# Patient Record
Sex: Female | Born: 1990 | Race: Black or African American | Hispanic: No | Marital: Single | Smoking: Current every day smoker
Health system: Southern US, Community
[De-identification: ages and names within clinical notes are randomized; demographics above are authoritative.]

## PROBLEM LIST (undated history)

## (undated) ENCOUNTER — Inpatient Hospital Stay (HOSPITAL_COMMUNITY): Payer: Self-pay

## (undated) DIAGNOSIS — F419 Anxiety disorder, unspecified: Secondary | ICD-10-CM

## (undated) DIAGNOSIS — Z789 Other specified health status: Secondary | ICD-10-CM

## (undated) HISTORY — PX: NO PAST SURGERIES: SHX2092

## (undated) HISTORY — DX: Other specified health status: Z78.9

---
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
## 2012-01-19 ENCOUNTER — Encounter (HOSPITAL_COMMUNITY): Payer: Self-pay | Admitting: Emergency Medicine

## 2012-01-19 ENCOUNTER — Emergency Department (HOSPITAL_COMMUNITY)
Admission: EM | Admit: 2012-01-19 | Discharge: 2012-01-19 | Disposition: A | Discharge status: Home / Self Care | Payer: BC Managed Care – PPO | Attending: Emergency Medicine | Admitting: Emergency Medicine

## 2012-01-19 DIAGNOSIS — R05 Cough: Secondary | ICD-10-CM | POA: Insufficient documentation

## 2012-01-19 DIAGNOSIS — J039 Acute tonsillitis, unspecified: Secondary | ICD-10-CM

## 2012-01-19 DIAGNOSIS — R599 Enlarged lymph nodes, unspecified: Secondary | ICD-10-CM | POA: Insufficient documentation

## 2012-01-19 DIAGNOSIS — J029 Acute pharyngitis, unspecified: Secondary | ICD-10-CM | POA: Insufficient documentation

## 2012-01-19 DIAGNOSIS — R059 Cough, unspecified: Secondary | ICD-10-CM | POA: Insufficient documentation

## 2012-01-19 MED ORDER — AZITHROMYCIN 250 MG PO TABS: 250.0000 mg | ORAL_TABLET | Freq: Every day | ORAL | Status: AC

## 2012-01-19 MED ORDER — AZITHROMYCIN 250 MG PO TABS
500.0000 mg | ORAL_TABLET | Freq: Once | ORAL | Status: AC
  Administered 2012-01-19: 500 mg via ORAL
  Filled 2012-01-19: qty 2

## 2012-01-19 NOTE — Discharge Instructions (Signed)
Plenty of fluids.   Follow up if not improving °

## 2012-01-19 NOTE — ED Provider Notes (Signed)
History   This chart was scribed for Benny Lennert, MD by Charolett Bumpers . The patient was seen in room APFT20/APFT20.    CSN: [id number]  Arrival date & time 01/19/12  1751   First MD Initiated Contact with Patient 01/19/12 1803      No chief complaint on file.   (Consider location/radiation/quality/duration/timing/severity/associated sxs/prior treatment) HPI Comments: Robyn Willis is a 21 y.o. female who presents to the Emergency Department complaining of constant, moderate sore throat since this morning. Patient also reports an associated productive cough with white phlegm. Patient reports no pertinent medical or surgical hx.     Patient is a 21 y.o. female presenting with pharyngitis. The history is provided by the patient.  Sore Throat This is a new problem. The current episode started 6 to 12 hours ago. The problem occurs constantly. The problem has been gradually worsening. She has tried nothing for the symptoms.    History reviewed. No pertinent past medical history.  History reviewed. No pertinent past surgical history.  No family history on file.  History  Substance Use Topics  . Smoking status: Never Smoker   . Smokeless tobacco: Not on file  . Alcohol Use: No    OB History    Grav Para Term Preterm Abortions TAB SAB Ect Mult Living                  Review of Systems  Constitutional: Negative for fever.  HENT: Positive for sore throat.   Respiratory: Positive for cough.   All other systems reviewed and are negative.    Allergies  Penicillins  Home Medications  No current outpatient prescriptions on file.  BP 144/89  Pulse 120  Temp 99.7 F (37.6 C) (Oral)  Resp 17  Ht 5\' 3"  (1.6 m)  Wt 116 lb (52.617 kg)  BMI 20.55 kg/m2  SpO2 99%  LMP 01/08/2012  Physical Exam  Constitutional: She is oriented to person, place, and time. She appears well-developed.  HENT:  Head: Normocephalic and atraumatic.  Mouth/Throat: Oropharyngeal  exudate present.       Tonsils are enlarged bilaterally.   Eyes: Conjunctivae and EOM are normal. No scleral icterus.  Neck: Neck supple. No thyromegaly present.  Cardiovascular: Normal rate and regular rhythm.  Exam reveals no gallop and no friction rub.   No murmur heard. Pulmonary/Chest: No stridor. She has no wheezes. She has no rales. She exhibits no tenderness.  Abdominal: She exhibits no distension. There is no tenderness. There is no rebound.  Musculoskeletal: Normal range of motion. She exhibits no edema.  Lymphadenopathy:    She has cervical adenopathy.  Neurological: She is oriented to person, place, and time. Coordination normal.  Skin: No rash noted. No erythema.  Psychiatric: She has a normal mood and affect. Her behavior is normal.    ED Course  Procedures (including critical care time)  DIAGNOSTIC STUDIES: Oxygen Saturation is 99% on room air, normal by my interpretation.    COORDINATION OF CARE:  1807: Discussed planned course of treatment with the patient, who is agreeable at this time. Will order a rapid strep screen.     Labs Reviewed - No data to display No results found.   No diagnosis found.    MDM   The chart was scribed for me under my direct supervision.  I personally performed the history, physical, and medical decision making and all procedures in the evaluation of this patient.Benny Lennert, MD  01/19/12 1850 

## 2012-04-06 ENCOUNTER — Encounter (HOSPITAL_COMMUNITY): Payer: Self-pay | Admitting: *Deleted

## 2012-04-06 ENCOUNTER — Emergency Department (HOSPITAL_COMMUNITY)
Admission: EM | Admit: 2012-04-06 | Discharge: 2012-04-06 | Disposition: A | Discharge status: Home / Self Care | Payer: BC Managed Care – PPO | Attending: Emergency Medicine | Admitting: Emergency Medicine

## 2012-04-06 DIAGNOSIS — N390 Urinary tract infection, site not specified: Secondary | ICD-10-CM | POA: Insufficient documentation

## 2012-04-06 LAB — URINALYSIS, ROUTINE W REFLEX MICROSCOPIC
Bilirubin Urine: NEGATIVE
Glucose, UA: NEGATIVE mg/dL
Specific Gravity, Urine: 1.02 (ref 1.005–1.030)
pH: 7.5 (ref 5.0–8.0)

## 2012-04-06 LAB — URINE MICROSCOPIC-ADD ON

## 2012-04-06 MED ORDER — PHENAZOPYRIDINE HCL 200 MG PO TABS: 200.0000 mg | ORAL_TABLET | Freq: Three times a day (TID) | ORAL | Status: DC

## 2012-04-06 MED ORDER — SULFAMETHOXAZOLE-TRIMETHOPRIM 800-160 MG PO TABS: 1.0000 | ORAL_TABLET | Freq: Two times a day (BID) | ORAL | Status: DC

## 2012-04-06 NOTE — ED Notes (Signed)
Last night started pressure and dysuria, No fever or chills, NO NV,  Now pain lt flank

## 2012-04-06 NOTE — ED Provider Notes (Signed)
History    This chart was scribed for [employer]. Rubin Payor, MD, MD by Smitty Pluck. The patient was seen in room APA05 and the patient's care was started at 10:15PM.  CSN: [id number]  Arrival date & time 04/06/12  1916   First MD Initiated Contact with Patient 04/06/12 2207      Chief Complaint  Patient presents with  . Dysuria    (Consider location/radiation/quality/duration/timing/severity/associated sxs/prior treatment) Patient is a 21 y.o. female presenting with dysuria. The history is provided by the patient. No language interpreter was used.  Dysuria  Associated symptoms include flank pain. Pertinent negatives include no chills, no nausea and no vomiting.   Robyn Willis is a 21 y.o. female who presents to the Emergency Department complaining of constant, moderate left flank pain onset today. Pt reports having dysuria onset 1 day ago. Pt denies fevers, chills, n/v/d. Denies any other pain. Denies radiation of pain.   History reviewed. No pertinent past medical history.  History reviewed. No pertinent past surgical history.  History reviewed. No pertinent family history.  History  Substance Use Topics  . Smoking status: Never Smoker   . Smokeless tobacco: Not on file  . Alcohol Use: No    OB History    Grav Para Term Preterm Abortions TAB SAB Ect Mult Living                  Review of Systems  Constitutional: Negative for fever and chills.  Respiratory: Negative for shortness of breath.   Gastrointestinal: Negative for nausea, vomiting and diarrhea.  Genitourinary: Positive for dysuria and flank pain.  Neurological: Negative for weakness.    Allergies  Penicillins  Home Medications   Current Outpatient Rx  Name Route Sig Dispense Refill  . NORGESTIM-ETH ESTRAD TRIPHASIC 0.18/0.215/0.25 MG-25 MCG PO TABS Oral Take 1 tablet by mouth daily.    Marland Kitchen PHENAZOPYRIDINE HCL 200 MG PO TABS Oral Take 1 tablet (200 mg total) by mouth 3 (three) times daily. 6 tablet 0    . SULFAMETHOXAZOLE-TRIMETHOPRIM 800-160 MG PO TABS Oral Take 1 tablet by mouth 2 (two) times daily. 6 tablet 0    BP 121/75  Pulse 93  Temp 98.2 F (36.8 C) (Oral)  Resp 20  Ht 5\' 3"  (1.6 m)  Wt 113 lb (51.256 kg)  BMI 20.02 kg/m2  SpO2 100%  LMP 04/02/2012  Physical Exam  Nursing note and vitals reviewed. Constitutional: She is oriented to person, place, and time. She appears well-developed and well-nourished.  HENT:  Head: Normocephalic and atraumatic.  Neck: Normal range of motion. Neck supple.  Cardiovascular: Normal rate, regular rhythm and normal heart sounds.   Pulmonary/Chest: Effort normal and breath sounds normal. No respiratory distress. She has no wheezes.  Abdominal: She exhibits no distension. There is tenderness in the suprapubic area. There is no rebound, no guarding and no CVA tenderness.  Neurological: She is alert and oriented to person, place, and time.  Skin: Skin is warm and dry.  Psychiatric: She has a normal mood and affect. Her behavior is normal.    ED Course  Procedures (including critical care time) DIAGNOSTIC STUDIES: Oxygen Saturation is 100% on room air, normal by my interpretation.    COORDINATION OF CARE: 10:18 PM Discussed pt ED treatment with pt     Labs Reviewed  URINALYSIS, ROUTINE W REFLEX MICROSCOPIC - Abnormal; Notable for the following:    APPearance HAZY (*)     Hgb urine dipstick LARGE (*)  Protein, ur 30 (*)     Leukocytes, UA TRACE (*)     All other components within normal limits  URINE MICROSCOPIC-ADD ON - Abnormal; Notable for the following:    Squamous Epithelial / LPF MANY (*)     Bacteria, UA MANY (*)     Casts HYALINE CASTS (*)     All other components within normal limits  PREGNANCY, URINE  URINE CULTURE   No results found.   1. UTI (lower urinary tract infection)       MDM  Patient with left lower abdominal pain. Mild tenderness. She does not appear septic and does not have CVA tenderness. She has  an apparent urinary tract infection on lab work. Urine culture was sent to to high level resistance. She be discharged home with Bactrim and Pyridium.  I personally performed the services described in this documentation, which was scribed in my presence. The recorded information has been reviewed and considered.        Juliet Rude. Rubin Payor, MD 04/06/12 [phone]

## 2012-04-08 LAB — URINE CULTURE

## 2012-04-09 NOTE — ED Notes (Signed)
+   Urine Patent treated with Septra-sensitive to same-chart appended per protocol MD.

## 2014-01-30 ENCOUNTER — Emergency Department (HOSPITAL_COMMUNITY)
Admission: EM | Admit: 2014-01-30 | Discharge: 2014-01-31 | Disposition: A | Discharge status: Home / Self Care | Payer: BC Managed Care – PPO | Attending: Emergency Medicine | Admitting: Emergency Medicine

## 2014-01-30 ENCOUNTER — Encounter (HOSPITAL_COMMUNITY): Payer: Self-pay | Admitting: Emergency Medicine

## 2014-01-30 DIAGNOSIS — Z792 Long term (current) use of antibiotics: Secondary | ICD-10-CM | POA: Insufficient documentation

## 2014-01-30 DIAGNOSIS — Z880 Allergy status to penicillin: Secondary | ICD-10-CM | POA: Insufficient documentation

## 2014-01-30 DIAGNOSIS — Z79899 Other long term (current) drug therapy: Secondary | ICD-10-CM | POA: Insufficient documentation

## 2014-01-30 DIAGNOSIS — N644 Mastodynia: Secondary | ICD-10-CM | POA: Insufficient documentation

## 2014-01-30 MED ORDER — NAPROXEN 500 MG PO TABS: 500.0000 mg | ORAL_TABLET | Freq: Two times a day (BID) | ORAL | Status: DC

## 2014-01-30 NOTE — ED Notes (Signed)
Pt felt throbbing pain in right breast and did a self breast exam. Noticed a small nodule on the inner aspect of the right breast. Tender to the touch.

## 2014-01-30 NOTE — Discharge Instructions (Signed)

## 2014-01-30 NOTE — ED Provider Notes (Signed)
CSN: [id number]     Arrival date & time 01/30/14  2247 History   First MD Initiated Contact with Patient 01/30/14 2313     Chief Complaint  Patient presents with  . Breast Pain     (Consider location/radiation/quality/duration/timing/severity/associated sxs/prior Treatment) HPI Comments: Robyn Willis is a 23 y.o. female who presents to the Emergency Department complaining of right medial breast pain for several hours.  States that she felt a "knot" to her breast near her breast bone and because it was tender to touch, she decided to come to ED for evaluation.  She states that she currently has Implanon implant and she is having regular menses.  She denies redness, drainage from her nipples, trauma, abdominal pain or vaginal bleeding at this time.  She has not tried any therapy prior to coming to the ED.   The history is provided by the patient.    History reviewed. No pertinent past medical history. History reviewed. No pertinent past surgical history. No family history on file. History  Substance Use Topics  . Smoking status: Never Smoker   . Smokeless tobacco: Not on file  . Alcohol Use: No   OB History   Grav Para Term Preterm Abortions TAB SAB Ect Mult Living                 Review of Systems  Constitutional: Negative for fever and chills.  Respiratory: Negative for cough, chest tightness and shortness of breath.   Gastrointestinal: Negative for nausea, vomiting and abdominal pain.  Genitourinary: Negative for dysuria, frequency, vaginal bleeding, vaginal discharge, difficulty urinating, genital sores, menstrual problem and pelvic pain.       Right breast pain  Musculoskeletal: Positive for arthralgias and joint swelling. Negative for neck pain.  Skin: Negative for color change, rash and wound.  All other systems reviewed and are negative.     Allergies  Penicillins  Home Medications   Prior to Admission medications   Medication Sig Start Date End Date Taking?  Authorizing Provider  Norgestimate-Ethinyl Estradiol Triphasic (ORTHO TRI-CYCLEN LO) 0.18/0.215/0.25 MG-25 MCG tablet Take 1 tablet by mouth daily.    Historical Provider, MD  phenazopyridine (PYRIDIUM) 200 MG tablet Take 1 tablet (200 mg total) by mouth 3 (three) times daily. 04/06/12   Juliet Rude. Pickering, MD  sulfamethoxazole-trimethoprim (SEPTRA DS) 800-160 MG per tablet Take 1 tablet by mouth 2 (two) times daily. 04/06/12   Juliet Rude. Pickering, MD   BP 134/85  Pulse 74  Temp(Src) 98.1 F (36.7 C) (Oral)  Resp 24  Ht 5\' 4"  (1.626 m)  Wt 110 lb (49.896 kg)  BMI 18.87 kg/m2  SpO2 100%  LMP 01/24/2014 Physical Exam  Nursing note and vitals reviewed. Constitutional: She is oriented to person, place, and time. She appears well-developed and well-nourished. No distress.  HENT:  Head: Normocephalic and atraumatic.  Mouth/Throat: Oropharynx is clear and moist.  Neck: Normal range of motion. Neck supple.  Cardiovascular: Normal rate, regular rhythm, normal heart sounds and intact distal pulses.   No murmur heard. Pulmonary/Chest: Effort normal and breath sounds normal. She exhibits no bony tenderness and no crepitus. Right breast exhibits tenderness. Right breast exhibits no inverted nipple, no nipple discharge and no skin change. Left breast exhibits no inverted nipple, no nipple discharge and no tenderness. Breasts are symmetrical.    2 cm firm, mobile nodule along the substernal border.  No skin changes, erythema, or fluctuance.  Pt has dense, nodular breast tissue bilaterally.  Nipple and  areola are normal appearing  Abdominal: Soft. She exhibits no distension and no mass. There is no tenderness. There is no rebound and no guarding.  Genitourinary: There is breast tenderness. No breast swelling, discharge or bleeding.  Musculoskeletal: Normal range of motion.  Lymphadenopathy:    She has no cervical adenopathy.  Neurological: She is alert and oriented to person, place, and time. She  exhibits normal muscle tone. Coordination normal.  Skin: Skin is warm and dry.    ED Course  Procedures (including critical care time) Labs Review Labs Reviewed - No data to display  Imaging Review No results found.   EKG Interpretation None      MDM   Final diagnoses:  Breast pain in female    Patient with Implanon since May.  Localized ttp of the medial right breast.  Has fibrocystic breast tissue.  Likely benign nodule, although pt was advised to need for GYN follow-up. Pt advised to avoid caffeine, naprosyn for pain and [facility]   Pt denies any vaginal symptoms or abdominal pain to warrant need for further evaluation at this time.  No reported hx of breast CA in immediate family per patient. Pt agrees to plan and appears stable for d/c.    Keimon Basaldua L. Emanual Lamountain, PA-C 02/02/14 1725

## 2014-02-04 NOTE — ED Provider Notes (Signed)
Medical screening examination/treatment/procedure(s) were performed by non-physician practitioner and as supervising physician I was immediately available for consultation/collaboration.    Vida RollerBrian D Olaf Mesa, MD 02/04/14 0040

## 2014-12-06 ENCOUNTER — Emergency Department
Admission: EM | Admit: 2014-12-06 | Discharge: 2014-12-06 | Disposition: A | Discharge status: Home / Self Care | Payer: Medicaid Other | Attending: Emergency Medicine | Admitting: Emergency Medicine

## 2014-12-06 DIAGNOSIS — Z3202 Encounter for pregnancy test, result negative: Secondary | ICD-10-CM | POA: Diagnosis not present

## 2014-12-06 DIAGNOSIS — R42 Dizziness and giddiness: Secondary | ICD-10-CM | POA: Insufficient documentation

## 2014-12-06 DIAGNOSIS — Z793 Long term (current) use of hormonal contraceptives: Secondary | ICD-10-CM | POA: Insufficient documentation

## 2014-12-06 DIAGNOSIS — Z79899 Other long term (current) drug therapy: Secondary | ICD-10-CM | POA: Insufficient documentation

## 2014-12-06 DIAGNOSIS — Z880 Allergy status to penicillin: Secondary | ICD-10-CM | POA: Insufficient documentation

## 2014-12-06 DIAGNOSIS — R110 Nausea: Secondary | ICD-10-CM

## 2014-12-06 DIAGNOSIS — R531 Weakness: Secondary | ICD-10-CM | POA: Diagnosis not present

## 2014-12-06 LAB — POCT PREGNANCY, URINE: Preg Test, Ur: NEGATIVE

## 2014-12-06 LAB — GLUCOSE, CAPILLARY: GLUCOSE-CAPILLARY: 110 mg/dL — AB (ref 65–99)

## 2014-12-06 MED ORDER — ONDANSETRON HCL 4 MG PO TABS: 4.0000 mg | ORAL_TABLET | Freq: Four times a day (QID) | ORAL | Status: DC | PRN

## 2014-12-06 MED ORDER — ONDANSETRON 4 MG PO TBDP
ORAL_TABLET | ORAL | Status: AC
  Filled 2014-12-06: qty 1

## 2014-12-06 MED ORDER — ONDANSETRON 4 MG PO TBDP
ORAL_TABLET | ORAL | Status: AC
  Administered 2014-12-06: 4 mg via ORAL
  Filled 2014-12-06: qty 1

## 2014-12-06 MED ORDER — ONDANSETRON 4 MG PO TBDP
4.0000 mg | ORAL_TABLET | Freq: Once | ORAL | Status: AC
  Administered 2014-12-06: 4 mg via ORAL

## 2014-12-06 NOTE — ED Provider Notes (Addendum)
[facility] Emergency Department Provider Note  ____________________________________________  Time seen: 7:40 PM  I have reviewed the triage vital signs and the nursing notes.   HISTORY  Chief Complaint Weakness and Nausea    HPI Robyn Willis is a 24 y.o. female who was in her usual state of health when she went to bed last night. When she woke up this morning she was feeling a little bit nauseated. She continued to feel slightly worse throughout the day until she went to work this afternoon where she continued to feel nauseated, which developed into generalized abdominal pain and dizziness. She did not pass out or fall. No vomiting or diarrhea, chest pain, shortness of breath, headache, vision changes or other concerns. The patient does report that she has had urinary frequency for the last few days despite avoiding sodas that she normally drinks. No specific pain other than the generalized abdominal pain which is crampy and moderate in intensity. Waxing and waning nonradiating. She notes she sometimes feels like this when she forgets to eat and she has not eaten anything today  The patient also does report that she's been feeling depressed recently due to severe stress at home. She notes that over the last week she's had difficulty sleeping and has a decreased appetite and feels anxious. A friend gave her a Xanax, which made her feel much better and she is requesting Xanax right now   No past medical history on file.  There are no active problems to display for this patient.   No past surgical history on file.  Current Outpatient Rx  Name  Route  Sig  Dispense  Refill  . naproxen (NAPROSYN) 500 MG tablet   Oral   Take 1 tablet (500 mg total) by mouth 2 (two) times daily.   20 tablet   0   . Norgestimate-Ethinyl Estradiol Triphasic (ORTHO TRI-CYCLEN LO) 0.18/0.215/0.25 MG-25 MCG tablet   Oral   Take 1 tablet by mouth daily.         . ondansetron  (ZOFRAN) 4 MG tablet   Oral   Take 1 tablet (4 mg total) by mouth every 6 (six) hours as needed for nausea or vomiting.   20 tablet   1   . phenazopyridine (PYRIDIUM) 200 MG tablet   Oral   Take 1 tablet (200 mg total) by mouth 3 (three) times daily.   6 tablet   0   . sulfamethoxazole-trimethoprim (SEPTRA DS) 800-160 MG per tablet   Oral   Take 1 tablet by mouth 2 (two) times daily.   6 tablet   0     Allergies Penicillins  No family history on file.  Social History History  Substance Use Topics  . Smoking status: Never Smoker   . Smokeless tobacco: Not on file  . Alcohol Use: No    Review of Systems  Constitutional: No fever or chills. No weight changes Eyes:No blurry vision or double vision.  ENT: No sore throat. Cardiovascular: No chest pain. Respiratory: No dyspnea or cough. Gastrointestinal: Generalized abdominal pain with nausea.  No BRBPR or melena. No vomiting or diarrhea Genitourinary: Negative for dysuria, urinary retention, bloody urine, or difficulty urinating. Positive urinary frequency Musculoskeletal: Negative for back pain. No joint swelling or pain. Skin: Negative for rash. Neurological: Negative for headaches, focal weakness or numbness. Psychiatric:No anxiety or depression.   Endocrine:No hot/cold intolerance, changes in energy, or sleep difficulty.  10-point ROS otherwise negative.  ____________________________________________   PHYSICAL EXAM:  VITAL SIGNS: ED Triage Vitals  Enc Vitals Group     BP 12/06/14 1839 169/104 mmHg     Pulse Rate 12/06/14 1839 93     Resp 12/06/14 1839 18     Temp --      Temp src --      SpO2 12/06/14 1839 99 %     Weight 12/06/14 1839 117 lb (53.071 kg)     Height 12/06/14 1839 5\' 4"  (1.626 m)     Head Cir --      Peak Flow --      Pain Score 12/06/14 1843 10     Pain Loc --      Pain Edu? --      Excl. in GC? --      Constitutional: Alert and oriented. Well appearing and in no  distress. Eyes: No scleral icterus. No conjunctival pallor. PERRL. EOMI ENT   Head: Normocephalic and atraumatic.   Nose: No congestion/rhinnorhea. No septal hematoma   Mouth/Throat: MMM, no pharyngeal erythema. No peritonsillar mass. No uvula shift.   Neck: No stridor. No SubQ emphysema. No meningismus. Hematological/Lymphatic/Immunilogical: No cervical lymphadenopathy. Cardiovascular: RRR. Normal and symmetric distal pulses are present in all extremities. No murmurs, rubs, or gallops. Respiratory: Normal respiratory effort without tachypnea nor retractions. Breath sounds are clear and equal bilaterally. No wheezes/rales/rhonchi. Gastrointestinal: Generalized tenderness worse in the epigastrium. No rebound, rigidity or guarding. No distention. Normoactive bowel sounds. No CVA tenderness Genitourinary: deferred Musculoskeletal: Nontender with normal range of motion in all extremities. No joint effusions.  No lower extremity tenderness.  No edema. Neurologic:   Normal speech and language.  CN 2-10 normal. Motor grossly intact. No pronator drift.  Normal gait. No gross focal neurologic deficits are appreciated.  Skin:  Skin is warm, dry and intact. No rash noted.  No petechiae, purpura, or bullae. Psychiatric: Mood and affect are normal. Speech and behavior are normal. Patient exhibits appropriate insight and judgment.  ____________________________________________    LABS (pertinent positives/negatives) (all labs ordered are listed, but only abnormal results are displayed) Labs Reviewed  CBC WITH DIFFERENTIAL/PLATELET  COMPREHENSIVE METABOLIC PANEL  LIPASE, BLOOD  URINALYSIS COMPLETEWITH MICROSCOPIC ([facility])   POC URINE PREG, ED  POCT PREGNANCY, URINE   pregnancy test negative ____________________________________________   EKG    ____________________________________________     RADIOLOGY    ____________________________________________   PROCEDURES  ____________________________________________   INITIAL IMPRESSION / ASSESSMENT AND PLAN / ED COURSE  Pertinent labs & imaging results that were available during my care of the patient were reviewed by me and considered in my medical decision making (see chart for details).  The patient's symptoms are consistent with nausea and vagal reaction due to starvation, acid reflux, or possibly a new onset diabetes. She does have a history of diabetes, so we'll check labs and give her a GI cocktail and follow-up  ----------------------------------------- 8:40 PM on 12/06/2014 ----------------------------------------- I called the lab to check on the status of the blood tests as in the computer, it appears they're not being run yet. They report that the blood samples "just arrived" in the lab. There were collected 2 hours ago.  ----------------------------------------- 10:28 PM on 12/06/2014 -----------------------------------------  Patient's nurse, just checked in with the lab regarding the blood tests that still are not resulted. No explanation for why we do not have results after 4 hours. Care of the patient is being signed out to Governor Rooksebecca Lord to follow-up on the blood tests when they return. I expect the  patient will be able to be discharged home once we have obtained these results.  ----------------------------------------- 10:36 PM on 12/06/2014 -----------------------------------------  Per charge nurse, and lab reports that they have discarded. The patient's blood samples and request a redraw. The patient refuses a redraw. I encouraged the patient to allow us to do a fingerstick blood glucose to screen her for diabetes which she is amenable to.  10:46 - fingerstick 110. Not diabetic/DKA. Discharge home. ____________________________________________   FINAL CLINICAL IMPRESSION(S) / ED DIAGNOSES  Final  diagnoses:  Dizziness  Nausea      Sharman CheekPhillip Loriene Taunton, MD 12/06/14 2230  Sharman CheekPhillip Korine Winton, MD 12/06/14 2246

## 2014-12-06 NOTE — ED Notes (Signed)
Patient with no complaints at this time. Respirations even and unlabored. Skin warm/dry. Discharge instructions reviewed with patient at this time. Patient given opportunity to voice concerns/ask questions. Patient discharged at this time and left Emergency Department with steady gait.   

## 2014-12-06 NOTE — Discharge Instructions (Signed)
Nausea and Vomiting Nausea is a sick feeling that often comes before throwing up (vomiting). Vomiting is a reflex where stomach contents come out of your mouth. Vomiting can cause severe loss of body fluids (dehydration). Children and elderly adults can become dehydrated quickly, especially if they also have diarrhea. Nausea and vomiting are symptoms of a condition or disease. It is important to find the cause of your symptoms. CAUSES   Direct irritation of the stomach lining. This irritation can result from increased acid production (gastroesophageal reflux disease), infection, food poisoning, taking certain medicines (such as nonsteroidal anti-inflammatory drugs), alcohol use, or tobacco use.  Signals from the brain.These signals could be caused by a headache, heat exposure, an inner ear disturbance, increased pressure in the brain from injury, infection, a tumor, or a concussion, pain, emotional stimulus, or metabolic problems.  An obstruction in the gastrointestinal tract (bowel obstruction).  Illnesses such as diabetes, hepatitis, gallbladder problems, appendicitis, kidney problems, cancer, sepsis, atypical symptoms of a heart attack, or eating disorders.  Medical treatments such as chemotherapy and radiation.  Receiving medicine that makes you sleep (general anesthetic) during surgery. DIAGNOSIS Your caregiver may ask for tests to be done if the problems do not improve after a few days. Tests may also be done if symptoms are severe or if the reason for the nausea and vomiting is not clear. Tests may include:  Urine tests.  Blood tests.  Stool tests.  Cultures (to look for evidence of infection).  X-rays or other imaging studies. Test results can help your caregiver make decisions about treatment or the need for additional tests. TREATMENT You need to stay well hydrated. Drink frequently but in small amounts.You may wish to drink water, sports drinks, clear broth, or eat frozen  ice pops or gelatin dessert to help stay hydrated.When you eat, eating slowly may help prevent nausea.There are also some antinausea medicines that may help prevent nausea. HOME CARE INSTRUCTIONS   Take all medicine as directed by your caregiver.  If you do not have an appetite, do not force yourself to eat. However, you must continue to drink fluids.  If you have an appetite, eat a normal diet unless your caregiver tells you differently.  Eat a variety of complex carbohydrates (rice, wheat, potatoes, bread), lean meats, yogurt, fruits, and vegetables.  Avoid high-fat foods because they are more difficult to digest.  Drink enough water and fluids to keep your urine clear or pale yellow.  If you are dehydrated, ask your caregiver for specific rehydration instructions. Signs of dehydration may include:  Severe thirst.  Dry lips and mouth.  Dizziness.  Dark urine.  Decreasing urine frequency and amount.  Confusion.  Rapid breathing or pulse. SEEK IMMEDIATE MEDICAL CARE IF:   You have blood or brown flecks (like coffee grounds) in your vomit.  You have black or bloody stools.  You have a severe headache or stiff neck.  You are confused.  You have severe abdominal pain.  You have chest pain or trouble breathing.  You do not urinate at least once every 8 hours.  You develop cold or clammy skin.  You continue to vomit for longer than 24 to 48 hours.  You have a fever. MAKE SURE YOU:   Understand these instructions.  Will watch your condition.  Will get help right away if you are not doing well or get worse. Document Released: 07/08/2005 Document Revised: 09/30/2011 Document Reviewed: 12/05/2010 ExitCare Patient Information 2015 [employer]. This information is not intended   to replace advice given to you by your health care provider. Make sure you discuss any questions you have with your health care provider.  Dizziness  Dizziness means you feel unsteady or  lightheaded. You might feel like you are going to pass out (faint). HOME CARE   Drink enough fluids to keep your pee (urine) clear or pale yellow.  Take your medicines exactly as told by your doctor. If you take blood pressure medicine, always stand up slowly from the lying or sitting position. Hold on to something to steady yourself.  If you need to stand in one place for a long time, move your legs often. Tighten and relax your leg muscles.  Have someone stay with you until you feel okay.  Do not drive or use heavy machinery if you feel dizzy.  Do not drink alcohol. GET HELP RIGHT AWAY IF:   You feel dizzy or lightheaded and it gets worse.  You feel sick to your stomach (nauseous), or you throw up (vomit).  You have trouble talking or walking.  You feel weak or have trouble using your arms, hands, or legs.  You cannot think clearly or have trouble forming sentences.  You have chest pain, belly (abdominal) pain, sweating, or you are short of breath.  Your vision changes.  You are bleeding.  You have problems from your medicine that seem to be getting worse. MAKE SURE YOU:   Understand these instructions.  Will watch your condition.  Will get help right away if you are not doing well or get worse. Document Released: 06/27/2011 Document Revised: 09/30/2011 Document Reviewed: 06/27/2011 [facility] Patient Information 2015 [employer] [place], [place]. This information is not intended to replace advice given to you by your health care provider. Make sure you discuss any questions you have with your health care provider.  You have been seen in the Emergency Department (ED)  Today. Due to the nature of some of your symptoms, we believe he would [facility]:  [facility] [address] [place] [address] Phone:  [phone] or [phone]  Open Access:   Walk-in ASSESSMENT hours, M-W-F, 8:00am - 3:00pm Advanced  Acess CRISIS:  M-F, 8:00am - 8:00pm Outpatient Services Office Hours:  M-F, 8:00am - 5:00pm  Please return to the Emergency Department (ED)  immediately if you have ANY thoughts of hurting yourself or anyone else, so that we may help you.  Please avoid alcohol and drug use.  Follow up with your doctor as soon as possible regarding today's ED visit.

## 2014-12-06 NOTE — ED Notes (Signed)
Followed up with patient s/p Zofran administration. Patient reports that she is feeling better with regards to her nausea, however the abd pain is still present. Patient back to the lobby to wait for a room.

## 2014-12-06 NOTE — ED Notes (Signed)
Contacted patient's boss (Tammy @ [phone]) per her request. No detailed information provided other than the fact that her employee had checked in for evaluation. Employer aware that patient was coming in. Will have employee f/u with boss when plan of care is more clearly defined.

## 2014-12-06 NOTE — ED Notes (Addendum)
Patient presents to the ED from work. Patient reports that when she got to work, she began to feel weak and nauseated. Patient went on lunch break thinking that perhaps she needed to eat something - had to pull over because she felt pre-syncopal. Patient denies recent illness. Patient actively vomiting in triage - Zofran 4mg  ODT given. Patient with c/o acute onset of diffuse abd pain.

## 2014-12-27 ENCOUNTER — Encounter (HOSPITAL_COMMUNITY): Payer: Self-pay | Admitting: Emergency Medicine

## 2014-12-27 ENCOUNTER — Emergency Department (HOSPITAL_COMMUNITY)
Admission: EM | Admit: 2014-12-27 | Discharge: 2014-12-27 | Disposition: A | Discharge status: Home / Self Care | Payer: Medicaid Other | Attending: Emergency Medicine | Admitting: Emergency Medicine

## 2014-12-27 DIAGNOSIS — M549 Dorsalgia, unspecified: Secondary | ICD-10-CM

## 2014-12-27 DIAGNOSIS — Z791 Long term (current) use of non-steroidal anti-inflammatories (NSAID): Secondary | ICD-10-CM | POA: Insufficient documentation

## 2014-12-27 DIAGNOSIS — Z880 Allergy status to penicillin: Secondary | ICD-10-CM | POA: Diagnosis not present

## 2014-12-27 DIAGNOSIS — M545 Low back pain: Secondary | ICD-10-CM | POA: Diagnosis present

## 2014-12-27 DIAGNOSIS — Z79899 Other long term (current) drug therapy: Secondary | ICD-10-CM | POA: Insufficient documentation

## 2014-12-27 MED ORDER — DICLOFENAC SODIUM 75 MG PO TBEC: 75.0000 mg | DELAYED_RELEASE_TABLET | Freq: Two times a day (BID) | ORAL | Status: DC

## 2014-12-27 MED ORDER — METHOCARBAMOL 500 MG PO TABS: 500.0000 mg | ORAL_TABLET | Freq: Two times a day (BID) | ORAL | Status: DC

## 2014-12-27 NOTE — ED Notes (Signed)
In to discharge patient home. RX's reviewed with patient. Patient then stating that she took her mother's hydrocodone at home and that it was the only thing to help her pain. Patient requesting RX for hydrocodone. RN advised PA of request. PA stating that she would not prescribe hydrocodone to patient for her complaint and that she would benefit more from Voltaren and robaxin Rx. Patient informed of PA's decision. Patient then became agitated and asking for the "head nurse" because I was rude and not helping her. RN tried to explain PA's decision again, but patient was walking out of department. Soon, patient returned to nurse's station to ask for charge RN again. Charge was not available, but Dr Blinda LeatherwoodPollina was at nurse's station and had been a part of the PA/RN discussion earlier. Dr Blinda LeatherwoodPollina spoke with patient about the clinical decision making process and [facility] policies regarding narcotic prescriptions. Patient offered discharge papers and RX's for voltaren and robaxin prior to leaving department to which patient refused stating she would "just go somewhere else". Patient refused to sign discharge paperwork and left department with her child. RN placed discharge paper work and prescriptions in Geographical information systems officershred box.

## 2014-12-27 NOTE — ED Provider Notes (Signed)
CSN: [id number]     Arrival date & time 12/27/14  1024 History   First MD Initiated Contact with Patient 12/27/14 1034     Chief Complaint  Patient presents with  . Back Pain     (Consider location/radiation/quality/duration/timing/severity/associated sxs/prior Treatment) Patient is a 24 y.o. female presenting with back pain.  Back Pain Location:  Lumbar spine Quality:  Aching Radiates to:  Does not radiate Pain severity:  Moderate Pain is:  Same all the time Onset quality:  Gradual Duration:  1 week Timing:  Constant Progression:  Worsening Chronicity:  New Context: recent injury   Relieved by:  Nothing Worsened by:  Nothing tried Ineffective treatments:  None tried Associated symptoms: no leg pain and no numbness   Pt complains of soeness in low back  History reviewed. No pertinent past medical history. History reviewed. No pertinent past surgical history. History reviewed. No pertinent family history. History  Substance Use Topics  . Smoking status: Never Smoker   . Smokeless tobacco: Not on file  . Alcohol Use: No   OB History    Gravida Para Term Preterm AB TAB SAB Ectopic Multiple Living            1     Review of Systems  Musculoskeletal: Positive for back pain.  Neurological: Negative for numbness.  All other systems reviewed and are negative.     Allergies  Penicillins  Home Medications   Prior to Admission medications   Medication Sig Start Date End Date Taking? Authorizing Provider  diclofenac (VOLTAREN) 75 MG EC tablet Take 1 tablet (75 mg total) by mouth 2 (two) times daily. 12/27/14   Elson AreasLeslie K Allana Shrestha, PA-C  methocarbamol (ROBAXIN) 500 MG tablet Take 1 tablet (500 mg total) by mouth 2 (two) times daily. 12/27/14   Elson AreasLeslie K Dixon Luczak, PA-C  naproxen (NAPROSYN) 500 MG tablet Take 1 tablet (500 mg total) by mouth 2 (two) times daily. 01/30/14   Tammy Triplett, PA-C  Norgestimate-Ethinyl Estradiol Triphasic (ORTHO TRI-CYCLEN LO) 0.18/0.215/0.25 MG-25 MCG  tablet Take 1 tablet by mouth daily.    Historical Provider, MD  ondansetron (ZOFRAN) 4 MG tablet Take 1 tablet (4 mg total) by mouth every 6 (six) hours as needed for nausea or vomiting. 12/06/14   Sharman CheekPhillip Stafford, MD  phenazopyridine (PYRIDIUM) 200 MG tablet Take 1 tablet (200 mg total) by mouth 3 (three) times daily. 04/06/12   Benjiman CoreNathan Pickering, MD  sulfamethoxazole-trimethoprim (SEPTRA DS) 800-160 MG per tablet Take 1 tablet by mouth 2 (two) times daily. 04/06/12   Benjiman CoreNathan Pickering, MD   BP 120/92 mmHg  Pulse 85  Temp(Src) 97.7 F (36.5 C) (Oral)  Resp 18  Ht 5\' 3"  (1.6 m)  Wt 115 lb (52.164 kg)  BMI 20.38 kg/m2  SpO2 97%  LMP 12/26/2014 Physical Exam  Constitutional: She is oriented to person, place, and time. She appears well-developed and well-nourished.  HENT:  Head: Normocephalic.  Right Ear: External ear normal.  Left Ear: External ear normal.  Nose: Nose normal.  Mouth/Throat: Oropharynx is clear and moist.  Eyes: EOM are normal.  Neck: Normal range of motion.  Cardiovascular: Normal rate and normal heart sounds.   Pulmonary/Chest: Effort normal.  Abdominal: Soft. She exhibits no distension.  Musculoskeletal: She exhibits tenderness.  Diffusely tender ls spine,  From lower extremities,  Ns and nv intact  Neurological: She is alert and oriented to person, place, and time.  Psychiatric: She has a normal mood and affect.  Nursing note and vitals  reviewed.   ED Course  Procedures (including critical care time) Labs Review Labs Reviewed - No data to display  Imaging Review No results found.   EKG Interpretation None      MDM   Final diagnoses:  Back pain, unspecified location    Robaxin Voltaren AVS oow x 2 days.   (Pt left without rx or paper work)      Elson Areas, PA-C 12/27/14 1200  Gilda Crease, MD 01/02/15 [phone]

## 2014-12-27 NOTE — Discharge Instructions (Signed)
Back Pain, Adult Low back pain is very common. About 1 in 5 people have back pain.The cause of low back pain is rarely dangerous. The pain often gets better over time.About half of people with a sudden onset of back pain feel better in just 2 weeks. About 8 in 10 people feel better by 6 weeks.  CAUSES Some common causes of back pain include:  Strain of the muscles or ligaments supporting the spine.  Wear and tear (degeneration) of the spinal discs.  Arthritis.  Direct injury to the back. DIAGNOSIS Most of the time, the direct cause of low back pain is not known.However, back pain can be treated effectively even when the exact cause of the pain is unknown.Answering your caregiver's questions about your overall health and symptoms is one of the most accurate ways to make sure the cause of your pain is not dangerous. If your caregiver needs more information, he or she may order lab work or imaging tests (X-rays or MRIs).However, even if imaging tests show changes in your back, this usually does not require surgery. HOME CARE INSTRUCTIONS For many people, back pain returns.Since low back pain is rarely dangerous, it is often a condition that people can learn to manageon their own.   Remain active. It is stressful on the back to sit or stand in one place. Do not sit, drive, or stand in one place for more than 30 minutes at a time. Take short walks on level surfaces as soon as pain allows.Try to increase the length of time you walk each day.  Do not stay in bed.Resting more than 1 or 2 days can delay your recovery.  Do not avoid exercise or work.Your body is made to move.It is not dangerous to be active, even though your back may hurt.Your back will likely heal faster if you return to being active before your pain is gone.  Pay attention to your body when you bend and lift. Many people have less discomfortwhen lifting if they bend their knees, keep the load close to their bodies,and  avoid twisting. Often, the most comfortable positions are those that put less stress on your recovering back.  Find a comfortable position to sleep. Use a firm mattress and lie on your side with your knees slightly bent. If you lie on your back, put a pillow under your knees.  Only take over-the-counter or prescription medicines as directed by your caregiver. Over-the-counter medicines to reduce pain and inflammation are often the most helpful.Your caregiver may prescribe muscle relaxant drugs.These medicines help dull your pain so you can more quickly return to your normal activities and healthy exercise.  Put ice on the injured area.  Put ice in a plastic bag.  Place a towel between your skin and the bag.  Leave the ice on for 15-20 minutes, 03-04 times a day for the first 2 to 3 days. After that, ice and heat may be alternated to reduce pain and spasms.  Ask your caregiver about trying back exercises and gentle massage. This may be of some benefit.  Avoid feeling anxious or stressed.Stress increases muscle tension and can worsen back pain.It is important to recognize when you are anxious or stressed and learn ways to manage it.Exercise is a great option. SEEK MEDICAL CARE IF:  You have pain that is not relieved with rest or medicine.  You have pain that does not improve in 1 week.  You have new symptoms.  You are generally not feeling well. SEEK   IMMEDIATE MEDICAL CARE IF:   You have pain that radiates from your back into your legs.  You develop new bowel or bladder control problems.  You have unusual weakness or numbness in your arms or legs.  You develop nausea or vomiting.  You develop abdominal pain.  You feel faint. Document Released: 07/08/2005 Document Revised: 01/07/2012 Document Reviewed: 11/09/2013 ExitCare Patient Information 2015 [employer]. This information is not intended to replace advice given to you by your health care provider. Make sure you  discuss any questions you have with your health care provider.  

## 2014-12-27 NOTE — ED Notes (Signed)
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

## 2015-03-07 ENCOUNTER — Encounter (HOSPITAL_COMMUNITY): Payer: Self-pay | Admitting: Emergency Medicine

## 2015-03-07 ENCOUNTER — Emergency Department (HOSPITAL_COMMUNITY)
Admission: EM | Admit: 2015-03-07 | Discharge: 2015-03-07 | Disposition: A | Discharge status: Home / Self Care | Payer: Medicaid Other | Attending: Emergency Medicine | Admitting: Emergency Medicine

## 2015-03-07 DIAGNOSIS — R6884 Jaw pain: Secondary | ICD-10-CM | POA: Diagnosis present

## 2015-03-07 DIAGNOSIS — Z793 Long term (current) use of hormonal contraceptives: Secondary | ICD-10-CM | POA: Diagnosis not present

## 2015-03-07 DIAGNOSIS — K088 Other specified disorders of teeth and supporting structures: Secondary | ICD-10-CM | POA: Diagnosis not present

## 2015-03-07 DIAGNOSIS — K0889 Other specified disorders of teeth and supporting structures: Secondary | ICD-10-CM

## 2015-03-07 DIAGNOSIS — Z880 Allergy status to penicillin: Secondary | ICD-10-CM | POA: Diagnosis not present

## 2015-03-07 MED ORDER — CLINDAMYCIN HCL 150 MG PO CAPS: 150.0000 mg | ORAL_CAPSULE | Freq: Four times a day (QID) | ORAL | Status: DC

## 2015-03-07 MED ORDER — NAPROXEN 375 MG PO TABS: 375.0000 mg | ORAL_TABLET | Freq: Two times a day (BID) | ORAL | Status: DC

## 2015-03-07 NOTE — Discharge Instructions (Signed)
When you follow up with a dentist they may want to do special x-rays to look at your teeth and TMJ area. Take the medications as directed and call tomorrow to try and schedule a follow up appointment with a dentist.

## 2015-03-07 NOTE — ED Provider Notes (Signed)
CSN: [id number]     Arrival date & time 03/07/15  1626 History   First MD Initiated Contact with Patient 03/07/15 1638     Chief Complaint  Patient presents with  . Jaw Pain     (Consider location/radiation/quality/duration/timing/severity/associated sxs/prior Treatment) The history is provided by the patient.   Robyn Willis is a 24 y.o. female who presents to the ED with left jaw pain that started today. She describes the pain as throbbing sharp that has been constant for the past 2 hours. The pain increases with opening her mouth wide or trying to chew. She does not think she has a tooth problem. She feels like when she closes her mouth it is not completely closing.   History reviewed. No pertinent past medical history. History reviewed. No pertinent past surgical history. History reviewed. No pertinent family history. Social History  Substance Use Topics  . Smoking status: Never Smoker   . Smokeless tobacco: None  . Alcohol Use: No   OB History    Gravida Para Term Preterm AB TAB SAB Ectopic Multiple Living            1     Review of Systems Negative except as stated in HPI   Allergies  Penicillins  Home Medications   Prior to Admission medications   Medication Sig Start Date End Date Taking? Authorizing Provider  clindamycin (CLEOCIN) 150 MG capsule Take 1 capsule (150 mg total) by mouth every 6 (six) hours. 03/07/15   Hope Orlene Och, NP  naproxen (NAPROSYN) 375 MG tablet Take 1 tablet (375 mg total) by mouth 2 (two) times daily. 03/07/15   Hope Orlene Och, NP  Norgestimate-Ethinyl Estradiol Triphasic (ORTHO TRI-CYCLEN LO) 0.18/0.215/0.25 MG-25 MCG tablet Take 1 tablet by mouth daily.    Historical Provider, MD   BP 130/64 mmHg  Pulse 87  Temp(Src) 98.8 F (37.1 C) (Oral)  Resp 18  Ht  (1.626 m)  Wt 115 lb (52.164 kg)  BMI 19.73 kg/m2  SpO2 100%  LMP 03/05/2015 Physical Exam  Constitutional: She is oriented to person, place, and time. She appears  well-developed and well-nourished.  HENT:  Head: Normocephalic and atraumatic.  Right Ear: External ear normal.  Left Ear: External ear normal.  Mouth/Throat: Uvula is midline, oropharynx is clear and moist and mucous membranes are normal.    Pain to the upper and lower left third molars. The gum surrounding the teeth has mild swelling and erythema. Pain with opening but more when closing the mouth and biting down. No pain with palpation over the TMJ, there is a click.   Eyes: Conjunctivae and EOM are normal. Pupils are equal, round, and reactive to light.  Neck: Normal range of motion. Neck supple.  Cardiovascular: Normal rate.   Pulmonary/Chest: Effort normal and breath sounds normal.  Abdominal: Soft. Bowel sounds are normal. There is no tenderness.  Musculoskeletal: Normal range of motion.  Neurological: She is alert and oriented to person, place, and time. No cranial nerve deficit.  Skin: Skin is warm and dry.  Psychiatric: She has a normal mood and affect. Her behavior is normal.  Nursing note and vitals reviewed.   ED Course  Procedures (including critical care time) Labs Review Labs Reviewed - No data to display  Imaging Review No results found. I have personally reviewed and evaluated these images and lab results as part of my medical decision-making.   EKG Interpretation None      MDM  24 y.o. female  with pain to the left jaw area and tenderness on exam of the upper and lower third molars. Pain with closing the mouth. Will treat for dental pain and possible infection and also discussed with the patient possible TMJ and that either needs follow up with a dentist. She will call for an appointment. Stable for d/c without trismus. Will start antibiotics and NSAIDS.   Final diagnoses:  Pain, dental      Janne Napoleon, NP 03/07/15 1714  Gilda Crease, MD 03/07/15 [phone]

## 2015-03-07 NOTE — ED Notes (Signed)
Having pain to left jaw and gum.  Rates pain 10/10.  Did not take any pain medication.

## 2015-05-22 ENCOUNTER — Encounter (HOSPITAL_COMMUNITY): Payer: Self-pay | Admitting: Emergency Medicine

## 2015-05-22 ENCOUNTER — Emergency Department (HOSPITAL_COMMUNITY)
Admission: EM | Admit: 2015-05-22 | Discharge: 2015-05-23 | Disposition: A | Discharge status: Home / Self Care | Payer: Medicaid Other | Attending: Emergency Medicine | Admitting: Emergency Medicine

## 2015-05-22 DIAGNOSIS — R531 Weakness: Secondary | ICD-10-CM | POA: Insufficient documentation

## 2015-05-22 DIAGNOSIS — Z3202 Encounter for pregnancy test, result negative: Secondary | ICD-10-CM | POA: Insufficient documentation

## 2015-05-22 DIAGNOSIS — Z975 Presence of (intrauterine) contraceptive device: Secondary | ICD-10-CM | POA: Insufficient documentation

## 2015-05-22 DIAGNOSIS — Z791 Long term (current) use of non-steroidal anti-inflammatories (NSAID): Secondary | ICD-10-CM | POA: Diagnosis not present

## 2015-05-22 DIAGNOSIS — Z793 Long term (current) use of hormonal contraceptives: Secondary | ICD-10-CM | POA: Insufficient documentation

## 2015-05-22 DIAGNOSIS — N939 Abnormal uterine and vaginal bleeding, unspecified: Secondary | ICD-10-CM

## 2015-05-22 DIAGNOSIS — Z880 Allergy status to penicillin: Secondary | ICD-10-CM | POA: Insufficient documentation

## 2015-05-22 DIAGNOSIS — N898 Other specified noninflammatory disorders of vagina: Secondary | ICD-10-CM | POA: Insufficient documentation

## 2015-05-22 LAB — URINALYSIS, ROUTINE W REFLEX MICROSCOPIC
Bilirubin Urine: NEGATIVE
Glucose, UA: NEGATIVE mg/dL
KETONES UR: NEGATIVE mg/dL
Nitrite: NEGATIVE
PROTEIN: NEGATIVE mg/dL
Specific Gravity, Urine: <1.005 — ABNORMAL LOW (ref 1.005–1.030)
UROBILINOGEN UA: 1 mg/dL (ref 0.0–1.0)
pH: 6 (ref 5.0–8.0)

## 2015-05-22 LAB — CBC WITH DIFFERENTIAL/PLATELET
BASOS ABS: 0 10*3/uL (ref 0.0–0.1)
BASOS PCT: 1 %
Eosinophils Absolute: 0.1 10*3/uL (ref 0.0–0.7)
Eosinophils Relative: 1 %
HEMATOCRIT: 34.6 % — AB (ref 36.0–46.0)
Hemoglobin: 11.7 g/dL — ABNORMAL LOW (ref 12.0–15.0)
LYMPHS PCT: 41 %
Lymphs Abs: 2.7 10*3/uL (ref 0.7–4.0)
MCH: 31.5 pg (ref 26.0–34.0)
MCHC: 33.8 g/dL (ref 30.0–36.0)
MCV: 93 fL (ref 78.0–100.0)
MONO ABS: 0.6 10*3/uL (ref 0.1–1.0)
Monocytes Relative: 9 %
NEUTROS ABS: 3.2 10*3/uL (ref 1.7–7.7)
NEUTROS PCT: 48 %
Platelets: 230 10*3/uL (ref 150–400)
RBC: 3.72 MIL/uL — AB (ref 3.87–5.11)
RDW: 11.7 % (ref 11.5–15.5)
WBC: 6.5 10*3/uL (ref 4.0–10.5)

## 2015-05-22 LAB — URINE MICROSCOPIC-ADD ON

## 2015-05-22 LAB — POC URINE PREG, ED: PREG TEST UR: NEGATIVE

## 2015-05-22 NOTE — ED Notes (Signed)
Pt states she used monstat egg for yeast treatment today and then she started having vaginal bleeding. Pt states this is her 2nd cycle this month.

## 2015-05-22 NOTE — ED Provider Notes (Signed)
CSN: [id number]     Arrival date & time 05/22/15  2222 History   First MD Initiated Contact with Patient 05/22/15 2228     Chief Complaint  Patient presents with  . Vaginal Bleeding     (Consider location/radiation/quality/duration/timing/severity/associated sxs/prior Treatment) HPI   Robyn Willis is a 24 y.o. female who presents to the Emergency Department complaining of sudden onset of vaginal bleeding that began yesterday after inserting an OTC one day Monistat treatment.  She states that she was having a "cheesy" white vaginal discharge with itching and burning for one week and had used the OTC treatment in the past that also caused vaginal bleeding.  She states that this episode has produced heavier than normal bleeding and she has used two tampons and several panty liners liners today.  She reports feeling generally weak today.  She states this is her second episode of bleeding this month and finished her menstrual cycle about two weeks ago.  She denies abdominal pain, fever, N/V, rash or back pain.  Has been off birth control for 4 months and reports hx of irregular bleeding since.  Denies any recent or new sexual partners.    History reviewed. No pertinent past medical history. History reviewed. No pertinent past surgical history. History reviewed. No pertinent family history. Social History  Substance Use Topics  . Smoking status: Never Smoker   . Smokeless tobacco: None  . Alcohol Use: No   OB History    Gravida Para Term Preterm AB TAB SAB Ectopic Multiple Living            1     Review of Systems  Constitutional: Negative for activity change and appetite change.  Gastrointestinal: Negative for nausea, vomiting and abdominal pain.  Genitourinary: Positive for vaginal bleeding, vaginal discharge and menstrual problem. Negative for dysuria, frequency, hematuria, flank pain and vaginal pain.  Musculoskeletal: Negative for back pain.  Neurological: Positive for weakness.  Negative for dizziness and numbness.  All other systems reviewed and are negative.     Allergies  Penicillins  Home Medications   Prior to Admission medications   Medication Sig Start Date End Date Taking? Authorizing Provider  clindamycin (CLEOCIN) 150 MG capsule Take 1 capsule (150 mg total) by mouth every 6 (six) hours. 03/07/15   Hope Orlene OchM Neese, NP  naproxen (NAPROSYN) 375 MG tablet Take 1 tablet (375 mg total) by mouth 2 (two) times daily. 03/07/15   Hope Orlene OchM Neese, NP  Norgestimate-Ethinyl Estradiol Triphasic (ORTHO TRI-CYCLEN LO) 0.18/0.215/0.25 MG-25 MCG tablet Take 1 tablet by mouth daily.    Historical Provider, MD   BP 120/81 mmHg  Pulse 74  Temp(Src) 97.9 F (36.6 C) (Oral)  Resp 20  Ht 5\' 3"  (1.6 m)  Wt 115 lb (52.164 kg)  BMI 20.38 kg/m2  SpO2 100%  LMP 05/22/2015   Physical Exam  Constitutional: She is oriented to person, place, and time. She appears well-developed and well-nourished. No distress.  HENT:  Head: Normocephalic and atraumatic.  Neck: Normal range of motion. Neck supple.  Cardiovascular: Normal rate, regular rhythm and intact distal pulses.   Pulmonary/Chest: Effort normal and breath sounds normal. No respiratory distress.  Abdominal: Soft. She exhibits no distension. There is no tenderness. There is no rebound and no guarding.  Genitourinary: Uterus normal. There is no rash or tenderness on the right labia. There is no rash or tenderness on the left labia. Cervix exhibits motion tenderness. Cervix exhibits no discharge. Right adnexum displays no mass  and no tenderness. Left adnexum displays no mass and no tenderness. There is bleeding in the vagina. No tenderness in the vagina. No foreign body around the vagina. No signs of injury around the vagina. No vaginal discharge found.  Mild to moderate amt of blood in the vaginal vault. Minimal CMT on exam.  No adnexal masses or tenderness.   Musculoskeletal: Normal range of motion.  Neurological: She is alert  and oriented to person, place, and time. Coordination normal.  Skin: Skin is warm and dry.  Nursing note and vitals reviewed.   ED Course  Procedures (including critical care time) Labs Review Labs Reviewed  WET PREP, GENITAL  URINALYSIS, ROUTINE W REFLEX MICROSCOPIC (NOT AT [facility])  RPR  CBC WITH DIFFERENTIAL/PLATELET  POC URINE PREG, ED  GC/CHLAMYDIA PROBE AMP ([place]) NOT AT [facility]      MDM   Final diagnoses:  Abnormal vaginal bleeding    Pt is well appearing, non-toxic.  abd is soft, NT.  texting on her phone during exam and requesting soda.  Asking if we have a wall charger for her phone.    Abnormal vaginal bleeding w/o abd pain or other concerning sx's for acute abdomen. bleeding reported to begin after insertion of Monistat ovalue.  she denies being sexually active at present.  No concerning sx's for torsion, TOA or PID. Patient is hemodynamically stable and agrees to d/c with close GYN f/u.  Strict return precautions also given.  Rosey Bath 05/25/15 2028  Glynn Octave, MD 05/26/15 [phone]

## 2015-05-23 LAB — WET PREP, GENITAL
Clue Cells Wet Prep HPF POC: NONE SEEN
TRICH WET PREP: NONE SEEN
YEAST WET PREP: NONE SEEN

## 2015-05-23 NOTE — Discharge Instructions (Signed)
Abnormal Uterine Bleeding °Abnormal uterine bleeding means bleeding from the vagina that is not your normal menstrual period. This can be: °· Bleeding or spotting between periods. °· Bleeding after sex (sexual intercourse). °· Bleeding that is heavier or more than normal. °· Periods that last longer than usual. °· Bleeding after menopause. °There are many problems that may cause this. Treatment will depend on the cause of the bleeding. Any kind of bleeding that is not normal should be reviewed by your doctor.  °HOME CARE °Watch your condition for any changes. These actions may lessen any discomfort you are having: °· Do not use tampons or douches as told by your doctor. °· Change your pads often. °You should get regular pelvic exams and Pap tests. Keep all appointments for tests as told by your doctor. °GET HELP IF: °· You are bleeding for more than 1 week. °· You feel dizzy at times. °GET HELP RIGHT AWAY IF:  °· You pass out. °· You have to change pads every 15 to 30 minutes. °· You have belly pain. °· You have a fever. °· You become sweaty or weak. °· You are passing large blood clots from the vagina. °· You feel sick to your stomach (nauseous) and throw up (vomit). °MAKE SURE YOU: °· Understand these instructions. °· Will watch your condition. °· Will get help right away if you are not doing well or get worse. °  °This information is not intended to replace advice given to you by your health care provider. Make sure you discuss any questions you have with your health care provider. °  °Document Released: 05/05/2009 Document Revised: 07/13/2013 Document Reviewed: 02/04/2013 °Elsevier Interactive Patient Education ©2016 [employer] ° °

## 2015-05-23 NOTE — ED Notes (Signed)
Pt alert & oriented x4, stable gait. Patient given discharge instructions, paperwork & prescription(s). Patient  instructed to stop at the registration desk to finish any additional paperwork. Patient verbalized understanding. Pt left department w/ no further questions. 

## 2015-05-24 LAB — RPR: RPR Ser Ql: NONREACTIVE

## 2015-05-24 LAB — GC/CHLAMYDIA PROBE AMP (~~LOC~~) NOT AT ARMC
CHLAMYDIA, DNA PROBE: NEGATIVE
NEISSERIA GONORRHEA: NEGATIVE

## 2015-05-24 LAB — URINE CULTURE

## 2015-06-19 ENCOUNTER — Encounter (HOSPITAL_COMMUNITY): Payer: Self-pay | Admitting: *Deleted

## 2015-06-19 ENCOUNTER — Emergency Department (HOSPITAL_COMMUNITY)
Admission: EM | Admit: 2015-06-19 | Discharge: 2015-06-19 | Disposition: A | Discharge status: Home / Self Care | Payer: Medicaid Other | Attending: Emergency Medicine | Admitting: Emergency Medicine

## 2015-06-19 DIAGNOSIS — R202 Paresthesia of skin: Secondary | ICD-10-CM

## 2015-06-19 DIAGNOSIS — F419 Anxiety disorder, unspecified: Secondary | ICD-10-CM | POA: Insufficient documentation

## 2015-06-19 DIAGNOSIS — Z880 Allergy status to penicillin: Secondary | ICD-10-CM | POA: Insufficient documentation

## 2015-06-19 MED ORDER — LORAZEPAM 1 MG PO TABS: 1.0000 mg | ORAL_TABLET | Freq: Two times a day (BID) | ORAL | Status: DC | PRN

## 2015-06-19 NOTE — ED Notes (Signed)
Pt c/o numbness to her left hip with the numbness going all the way down to her foot

## 2015-06-19 NOTE — ED Notes (Signed)
Pt ambulated in hall without assistance, pt still c/o some left arm pain

## 2015-06-19 NOTE — ED Provider Notes (Signed)
CSN: [id number]     Arrival date & time 06/19/15  0144 History   First MD Initiated Contact with Patient 06/19/15 0155     Chief Complaint  Patient presents with  . Numbness     (Consider location/radiation/quality/duration/timing/severity/associated sxs/prior Treatment) HPI  This is a 24 year old female who presents with numbness. Patient reports onset of left leg numbness and paresthesia earlier today. It has progressed into her left arm. She describes it as tingly and "like it's falling asleep." She relates this to increasing anxiety. States that she felt like she had panic attack earlier today. She reports increased stress. She's previously been on medications and her primary physician has recommended therapy. She denies any weakness. She denies any gait difficulty. Denies headache, neck pain, or back pain. Denies any recent illnesses or fevers.  History reviewed. No pertinent past medical history. History reviewed. No pertinent past surgical history. History reviewed. No pertinent family history. Social History  Substance Use Topics  . Smoking status: Never Smoker   . Smokeless tobacco: None  . Alcohol Use: No   OB History    Gravida Para Term Preterm AB TAB SAB Ectopic Multiple Living            1     Review of Systems  Constitutional: Negative for fever.  Respiratory: Negative for chest tightness and shortness of breath.   Cardiovascular: Negative for chest pain.  Genitourinary: Negative for dysuria.  Musculoskeletal: Negative for back pain and neck pain.  Neurological: Positive for numbness. Negative for dizziness, weakness and headaches.  All other systems reviewed and are negative.     Allergies  Penicillins  Home Medications   Prior to Admission medications   Medication Sig Start Date End Date Taking? Authorizing Provider  clindamycin (CLEOCIN) 150 MG capsule Take 1 capsule (150 mg total) by mouth every 6 (six) hours. Patient not taking: Reported on  05/22/2015 03/07/15   Janne NapoleonHope M Neese, NP  naproxen (NAPROSYN) 375 MG tablet Take 1 tablet (375 mg total) by mouth 2 (two) times daily. Patient not taking: Reported on 05/22/2015 03/07/15   Hope Orlene OchM Neese, NP   BP 145/106 mmHg  Pulse 95  Temp(Src) 97.8 F (36.6 C) (Oral)  Resp 20  Ht 5\' 3"  (1.6 m)  Wt 115 lb (52.164 kg)  BMI 20.38 kg/m2  SpO2 100%  LMP 06/17/2015 Physical Exam  Constitutional: She is oriented to person, place, and time. She appears well-developed and well-nourished. No distress.  HENT:  Head: Normocephalic and atraumatic.  Cardiovascular: Normal rate, regular rhythm and normal heart sounds.   No murmur heard. Pulmonary/Chest: Effort normal and breath sounds normal. No respiratory distress. She has no wheezes.  Abdominal: Soft. There is no tenderness.  Neurological: She is alert and oriented to person, place, and time.  5 out of 5 strength in all 4 extremities including grip, deltoids, triceps, biceps, hip flexor, dorsi and plantar flexion bilaterally. No clonus, normal reflexes, sensation grossly intact to light touch, normal gait  Skin: Skin is warm and dry.  Psychiatric: She has a normal mood and affect.  Nursing note and vitals reviewed.   ED Course  Procedures (including critical care time) Labs Review Labs Reviewed - No data to display  Imaging Review No results found. I have personally reviewed and evaluated these images and lab results as part of my medical decision-making.   EKG Interpretation None      MDM   Final diagnoses:  Anxiety  Paresthesia    Patient presents  with left arm and leg tingling and paresthesias. Neurologically intact. Relates this to increasing anxiety and stress. Has a history of the same. Neurologic exam is without objective deficit. She has no back or neck pain. No recent illnesses or ascending symptoms suggestive of healed barr or transverse myelitis. Normal reflexes. Discussed with patient she will need to follow-up with  her primary physician regarding anxiety. Her symptoms may be paresthesias related to her anxiety. She will be given today prescription for Ativan.  After history, exam, and medical workup I feel the patient has been appropriately medically screened and is safe for discharge home. Pertinent diagnoses were discussed with the patient. Patient was given return precautions.     Shon Baton, MD 06/19/15 [phone]

## 2015-06-19 NOTE — Discharge Instructions (Signed)
Panic Attacks °Panic attacks are sudden, short-lived surges of severe anxiety, fear, or discomfort. They may occur for no reason when you are relaxed, when you are anxious, or when you are sleeping. Panic attacks may occur for a number of reasons:  °· Healthy people occasionally have panic attacks in extreme, life-threatening situations, such as war or natural disasters. Normal anxiety is a protective mechanism of the body that helps us react to danger (fight or flight response). °· Panic attacks are often seen with anxiety disorders, such as panic disorder, social anxiety disorder, generalized anxiety disorder, and phobias. Anxiety disorders cause excessive or uncontrollable anxiety. They may interfere with your relationships or other life activities. °· Panic attacks are sometimes seen with other mental illnesses, such as depression and posttraumatic stress disorder. °· Certain medical conditions, prescription medicines, and drugs of abuse can cause panic attacks. °SYMPTOMS  °Panic attacks start suddenly, peak within 20 minutes, and are accompanied by four or more of the following symptoms: °· Pounding heart or fast heart rate (palpitations). °· Sweating. °· Trembling or shaking. °· Shortness of breath or feeling smothered. °· Feeling choked. °· Chest pain or discomfort. °· Nausea or strange feeling in your stomach. °· Dizziness, light-headedness, or feeling like you will faint. °· Chills or hot flushes. °· Numbness or tingling in your lips or hands and feet. °· Feeling that things are not real or feeling that you are not yourself. °· Fear of losing control or going crazy. °· Fear of dying. °Some of these symptoms can mimic serious medical conditions. For example, you may think you are having a heart attack. Although panic attacks can be very scary, they are not life threatening. °DIAGNOSIS  °Panic attacks are diagnosed through an assessment by your health care provider. Your health care provider will ask  questions about your symptoms, such as where and when they occurred. Your health care provider will also ask about your medical history and use of alcohol and drugs, including prescription medicines. Your health care provider may order blood tests or other studies to rule out a serious medical condition. Your health care provider may refer you to a mental health professional for further evaluation. °TREATMENT  °· Most healthy people who have one or two panic attacks in an extreme, life-threatening situation will not require treatment. °· The treatment for panic attacks associated with anxiety disorders or other mental illness typically involves counseling with a mental health professional, medicine, or a combination of both. Your health care provider will help determine what treatment is best for you. °· Panic attacks due to physical illness usually go away with treatment of the illness. If prescription medicine is causing panic attacks, talk with your health care provider about stopping the medicine, decreasing the dose, or substituting another medicine. °· Panic attacks due to alcohol or drug abuse go away with abstinence. Some adults need professional help in order to stop drinking or using drugs. °HOME CARE INSTRUCTIONS  °· Take all medicines as directed by your health care provider.   °· Schedule and attend follow-up visits as directed by your health care provider. It is important to keep all your appointments. °SEEK MEDICAL CARE IF: °· You are not able to take your medicines as prescribed. °· Your symptoms do not improve or get worse. °SEEK IMMEDIATE MEDICAL CARE IF:  °· You experience panic attack symptoms that are different than your usual symptoms. °· You have serious thoughts about hurting yourself or others. °· You are taking medicine for panic attacks and   have a serious side effect. °MAKE SURE YOU: °· Understand these instructions. °· Will watch your condition. °· Will get help right away if you are not  doing well or get worse. °  °This information is not intended to replace advice given to you by your health care provider. Make sure you discuss any questions you have with your health care provider. °  °Document Released: 07/08/2005 Document Revised: 07/13/2013 Document Reviewed: 02/19/2013 °Elsevier Interactive Patient Education ©2016 [employer] °Paresthesia °Paresthesia is an abnormal burning or prickling sensation. This sensation is generally felt in the hands, arms, legs, or feet. However, it may occur in any part of the body. Usually, it is not painful. The feeling may be described as: °· Tingling or numbness. °· Pins and needles. °· Skin crawling. °· Buzzing. °· Limbs falling asleep. °· Itching. °Most people experience temporary (transient) paresthesia at some time in their lives. Paresthesia may occur when you breathe too quickly (hyperventilation). It can also occur without any apparent cause. Commonly, paresthesia occurs when pressure is placed on a nerve. The sensation quickly goes away after the pressure is removed. For some people, however, paresthesia is a long-lasting (chronic) condition that is caused by an underlying disorder. If you continue to have paresthesia, you may need further medical evaluation. °HOME CARE INSTRUCTIONS °Watch your condition for any changes. Taking the following actions may help to lessen any discomfort that you are feeling: °· Avoid drinking alcohol. °· Try acupuncture or massage to help relieve your symptoms. °· Keep all follow-up visits as directed by your health care provider. This is important. °SEEK MEDICAL CARE IF: °· You continue to have episodes of paresthesia. °· Your burning or prickling feeling gets worse when you walk. °· You have pain, cramps, or dizziness. °· You develop a rash. °SEEK IMMEDIATE MEDICAL CARE IF: °· You feel weak. °· You have trouble walking or moving. °· You have problems with speech, understanding, or vision. °· You feel confused. °· You  cannot control your bladder or bowel movements. °· You have numbness after an injury. °· You faint. °  °This information is not intended to replace advice given to you by your health care provider. Make sure you discuss any questions you have with your health care provider. °  °Document Released: 06/28/2002 Document Revised: 11/22/2014 Document Reviewed: 07/04/2014 °Elsevier Interactive Patient Education ©2016 [employer] ° °

## 2015-09-04 ENCOUNTER — Encounter (HOSPITAL_COMMUNITY): Payer: Self-pay | Admitting: *Deleted

## 2015-09-04 ENCOUNTER — Emergency Department (HOSPITAL_COMMUNITY)
Admission: EM | Admit: 2015-09-04 | Discharge: 2015-09-04 | Disposition: A | Discharge status: Home / Self Care | Payer: Medicaid Other | Attending: Emergency Medicine | Admitting: Emergency Medicine

## 2015-09-04 DIAGNOSIS — R42 Dizziness and giddiness: Secondary | ICD-10-CM | POA: Insufficient documentation

## 2015-09-04 DIAGNOSIS — R140 Abdominal distension (gaseous): Secondary | ICD-10-CM | POA: Insufficient documentation

## 2015-09-04 DIAGNOSIS — R630 Anorexia: Secondary | ICD-10-CM | POA: Insufficient documentation

## 2015-09-04 DIAGNOSIS — R350 Frequency of micturition: Secondary | ICD-10-CM | POA: Insufficient documentation

## 2015-09-04 DIAGNOSIS — R109 Unspecified abdominal pain: Secondary | ICD-10-CM

## 2015-09-04 DIAGNOSIS — Z880 Allergy status to penicillin: Secondary | ICD-10-CM | POA: Insufficient documentation

## 2015-09-04 DIAGNOSIS — Z3202 Encounter for pregnancy test, result negative: Secondary | ICD-10-CM | POA: Insufficient documentation

## 2015-09-04 DIAGNOSIS — R197 Diarrhea, unspecified: Secondary | ICD-10-CM

## 2015-09-04 DIAGNOSIS — G478 Other sleep disorders: Secondary | ICD-10-CM | POA: Insufficient documentation

## 2015-09-04 DIAGNOSIS — R1084 Generalized abdominal pain: Secondary | ICD-10-CM | POA: Insufficient documentation

## 2015-09-04 DIAGNOSIS — N939 Abnormal uterine and vaginal bleeding, unspecified: Secondary | ICD-10-CM | POA: Insufficient documentation

## 2015-09-04 DIAGNOSIS — R110 Nausea: Secondary | ICD-10-CM

## 2015-09-04 LAB — COMPREHENSIVE METABOLIC PANEL
ALBUMIN: 3.8 g/dL (ref 3.5–5.0)
ALT: 18 U/L (ref 14–54)
AST: 23 U/L (ref 15–41)
Alkaline Phosphatase: 42 U/L (ref 38–126)
Anion gap: 8 (ref 5–15)
BUN: 5 mg/dL — AB (ref 6–20)
CHLORIDE: 107 mmol/L (ref 101–111)
CO2: 25 mmol/L (ref 22–32)
Calcium: 8.4 mg/dL — ABNORMAL LOW (ref 8.9–10.3)
Creatinine, Ser: 0.54 mg/dL (ref 0.44–1.00)
GFR calc Af Amer: >60 mL/min (ref >60)
GLUCOSE: 124 mg/dL — AB (ref 65–99)
POTASSIUM: 3.6 mmol/L (ref 3.5–5.1)
SODIUM: 140 mmol/L (ref 135–145)
Total Bilirubin: 0.4 mg/dL (ref 0.3–1.2)
Total Protein: 6.6 g/dL (ref 6.5–8.1)

## 2015-09-04 LAB — CBC WITH DIFFERENTIAL/PLATELET
Basophils Absolute: 0 10*3/uL (ref 0.0–0.1)
Basophils Relative: 0 %
EOS ABS: 0 10*3/uL (ref 0.0–0.7)
EOS PCT: 1 %
HCT: 35.4 % — ABNORMAL LOW (ref 36.0–46.0)
Hemoglobin: 12.2 g/dL (ref 12.0–15.0)
LYMPHS ABS: 2 10*3/uL (ref 0.7–4.0)
LYMPHS PCT: 29 %
MCH: 31.9 pg (ref 26.0–34.0)
MCHC: 34.5 g/dL (ref 30.0–36.0)
MCV: 92.7 fL (ref 78.0–100.0)
Monocytes Absolute: 0.6 10*3/uL (ref 0.1–1.0)
Monocytes Relative: 8 %
NEUTROS ABS: 4.3 10*3/uL (ref 1.7–7.7)
Neutrophils Relative %: 62 %
PLATELETS: 245 10*3/uL (ref 150–400)
RBC: 3.82 MIL/uL — AB (ref 3.87–5.11)
RDW: 11.5 % (ref 11.5–15.5)
WBC: 6.9 10*3/uL (ref 4.0–10.5)

## 2015-09-04 LAB — URINALYSIS, ROUTINE W REFLEX MICROSCOPIC
Bilirubin Urine: NEGATIVE
Glucose, UA: NEGATIVE mg/dL
Hgb urine dipstick: NEGATIVE
KETONES UR: NEGATIVE mg/dL
LEUKOCYTES UA: NEGATIVE
NITRITE: NEGATIVE
PROTEIN: NEGATIVE mg/dL
Specific Gravity, Urine: <1.005 — ABNORMAL LOW (ref 1.005–1.030)
pH: 7 (ref 5.0–8.0)

## 2015-09-04 LAB — WET PREP, GENITAL
Clue Cells Wet Prep HPF POC: NONE SEEN
Sperm: NONE SEEN
Trich, Wet Prep: NONE SEEN
YEAST WET PREP: NONE SEEN

## 2015-09-04 LAB — LIPASE, BLOOD: LIPASE: 27 U/L (ref 11–51)

## 2015-09-04 LAB — PREGNANCY, URINE: Preg Test, Ur: NEGATIVE

## 2015-09-04 MED ORDER — ONDANSETRON HCL 4 MG/2ML IJ SOLN
4.0000 mg | Freq: Once | INTRAMUSCULAR | Status: AC
  Administered 2015-09-04: 4 mg via INTRAVENOUS
  Filled 2015-09-04: qty 2

## 2015-09-04 MED ORDER — DICYCLOMINE HCL 10 MG PO CAPS
20.0000 mg | ORAL_CAPSULE | Freq: Once | ORAL | Status: AC
  Administered 2015-09-04: 20 mg via ORAL
  Filled 2015-09-04: qty 2

## 2015-09-04 MED ORDER — PROMETHAZINE HCL 25 MG/ML IJ SOLN
12.5000 mg | Freq: Once | INTRAMUSCULAR | Status: AC
  Administered 2015-09-04: 12.5 mg via INTRAVENOUS
  Filled 2015-09-04: qty 1

## 2015-09-04 MED ORDER — DICYCLOMINE HCL 20 MG PO TABS: 20.0000 mg | ORAL_TABLET | Freq: Three times a day (TID) | ORAL | Status: DC

## 2015-09-04 MED ORDER — ONDANSETRON 4 MG PO TBDP: 4.0000 mg | ORAL_TABLET | Freq: Three times a day (TID) | ORAL | Status: DC | PRN

## 2015-09-04 MED ORDER — LOPERAMIDE HCL 2 MG PO CAPS: 2.0000 mg | ORAL_CAPSULE | Freq: Four times a day (QID) | ORAL | Status: DC | PRN

## 2015-09-04 MED ORDER — SODIUM CHLORIDE 0.9 % IV SOLN
INTRAVENOUS | Status: DC
  Administered 2015-09-04: 03:00:00 via INTRAVENOUS

## 2015-09-04 MED ORDER — FAMOTIDINE IN NACL 20-0.9 MG/50ML-% IV SOLN
20.0000 mg | Freq: Once | INTRAVENOUS | Status: AC
  Administered 2015-09-04: 20 mg via INTRAVENOUS
  Filled 2015-09-04: qty 50

## 2015-09-04 MED ORDER — KETOROLAC TROMETHAMINE 30 MG/ML IJ SOLN
30.0000 mg | Freq: Once | INTRAMUSCULAR | Status: AC
  Administered 2015-09-04: 30 mg via INTRAVENOUS
  Filled 2015-09-04: qty 1

## 2015-09-04 MED ORDER — LOPERAMIDE HCL 2 MG PO CAPS
2.0000 mg | ORAL_CAPSULE | Freq: Once | ORAL | Status: AC
  Administered 2015-09-04: 2 mg via ORAL
  Filled 2015-09-04: qty 1

## 2015-09-04 MED ORDER — IBUPROFEN 800 MG PO TABS: 800.0000 mg | ORAL_TABLET | Freq: Three times a day (TID) | ORAL | Status: DC | PRN

## 2015-09-04 NOTE — ED Provider Notes (Signed)
CSN: [id number]     Arrival date & time 09/04/15  0127 History   First MD Initiated Contact with Patient 09/04/15 0159     Chief Complaint  Patient presents with  . Abdominal Pain     (Consider location/radiation/quality/duration/timing/severity/associated sxs/prior Treatment) Patient is a 25 y.o. female presenting with abdominal pain. The history is provided by the patient.  Abdominal Pain Pain location:  Generalized Pain quality: aching and cramping   Pain radiates to:  Does not radiate Pain severity:  Severe Onset quality:  Gradual Duration:  24 hours Timing:  Constant Progression:  Worsening Chronicity:  New Relieved by:  Nothing Ineffective treatments:  OTC medications and NSAIDs Associated symptoms: anorexia, diarrhea, flatus, nausea and vaginal bleeding   Associated symptoms: no chest pain, no chills, no constipation, no cough, no dysuria, no fever, no shortness of breath, no vaginal discharge and no vomiting    Robyn Willis is a 25 y.o. G1 P1, LMP 09/02/15 and started normal, then got heavy yesterday and then today just spotting. Sexually active with one partner x 6 months. No hx of STI's. Normal pap smears with last one less than one year ago. Condoms for birth control. Patient reports that yesterday she had diarrhea and today she has had a couple loose stools. Patient states that initially she thought she was just having pain with her period but then it continued to get worse and the nausea started.   History reviewed. No pertinent past medical history. History reviewed. No pertinent past surgical history. History reviewed. No pertinent family history. Social History  Substance Use Topics  . Smoking status: Never Smoker   . Smokeless tobacco: None  . Alcohol Use: No   OB History    Gravida Para Term Preterm AB TAB SAB Ectopic Multiple Living            1     Review of Systems  Constitutional: Positive for appetite change. Negative for fever and chills.  Eyes:  Negative for pain, redness, itching and visual disturbance.  Respiratory: Negative for cough, chest tightness, shortness of breath and wheezing.   Cardiovascular: Negative for chest pain, palpitations and leg swelling.  Gastrointestinal: Positive for nausea, abdominal pain, diarrhea, anorexia and flatus. Negative for vomiting and constipation.  Genitourinary: Positive for frequency and vaginal bleeding. Negative for dysuria, vaginal discharge and dyspareunia.  Musculoskeletal: Negative for myalgias, back pain, neck pain and neck stiffness.  Skin: Negative for rash and wound.  Neurological: Positive for light-headedness. Negative for headaches.  Psychiatric/Behavioral: Positive for sleep disturbance (due to pain). Negative for confusion. Nervous/anxious: anxiety in the past.       Allergies  Penicillins  Home Medications   Prior to Admission medications   Medication Sig Start Date End Date Taking? Authorizing Provider  clindamycin (CLEOCIN) 150 MG capsule Take 1 capsule (150 mg total) by mouth every 6 (six) hours. Patient not taking: Reported on 05/22/2015 03/07/15   Janne Napoleon, NP  LORazepam (ATIVAN) 1 MG tablet Take 1 tablet (1 mg total) by mouth 2 (two) times daily as needed for anxiety. 06/19/15   Shon Baton, MD  naproxen (NAPROSYN) 375 MG tablet Take 1 tablet (375 mg total) by mouth 2 (two) times daily. Patient not taking: Reported on 05/22/2015 03/07/15   Janne Napoleon, NP   BP 133/97 mmHg  Pulse 62  Temp(Src) 98.3 F (36.8 C) (Oral)  Resp 20  Ht  (1.6 m)  Wt 52.164 kg  BMI 20.38 kg/m2  SpO2 100%  LMP 09/02/2015 Physical Exam  Constitutional: She is oriented to person, place, and time. She appears well-developed and well-nourished. No distress.  Patient crying and appears uncomfortable.   HENT:  Head: Normocephalic and atraumatic.  Eyes: Conjunctivae and EOM are normal.  Neck: Neck supple.  Cardiovascular: Normal rate and regular rhythm.   Elevated BP   Pulmonary/Chest: Effort normal and breath sounds normal.  Abdominal: Soft. Bowel sounds are increased. There is generalized tenderness. There is no rigidity, no rebound, no guarding and no CVA tenderness.  Increased pain with palpation of the epigastric area.   Genitourinary:  External genitalia without lesions, scant blood vaginal vault, positive CMT, no adnexal tenderness or mass palpated, uterus without palpable enlargement.   Musculoskeletal: Normal range of motion.  Neurological: She is alert and oriented to person, place, and time. No cranial nerve deficit.  Skin: Skin is warm and dry.  Psychiatric: She has a normal mood and affect. Her behavior is normal.  Nursing note and vitals reviewed.   ED Course  Procedures (including critical care time) Labs Review Results for orders placed or performed during the hospital encounter of 09/04/15 (from the past 24 hour(s))  Urinalysis, Routine w reflex microscopic (not at [facility])     Status: Abnormal   Collection Time: 09/04/15  2:20 AM  Result Value Ref Range   Color, Urine YELLOW YELLOW   APPearance CLEAR CLEAR   Specific Gravity, Urine <1.005 (L) 1.005 - 1.030   pH 7.0 5.0 - 8.0   Glucose, UA NEGATIVE NEGATIVE mg/dL   Hgb urine dipstick NEGATIVE NEGATIVE   Bilirubin Urine NEGATIVE NEGATIVE   Ketones, ur NEGATIVE NEGATIVE mg/dL   Protein, ur NEGATIVE NEGATIVE mg/dL   Nitrite NEGATIVE NEGATIVE   Leukocytes, UA NEGATIVE NEGATIVE  Comprehensive metabolic panel     Status: Abnormal   Collection Time: 09/04/15  2:20 AM  Result Value Ref Range   Sodium 140 135 - 145 mmol/L   Potassium 3.6 3.5 - 5.1 mmol/L   Chloride 107 101 - 111 mmol/L   CO2 25 22 - 32 mmol/L   Glucose, Bld 124 (H) 65 - 99 mg/dL   BUN 5 (L) 6 - 20 mg/dL   Creatinine, Ser 9.60 0.44 - 1.00 mg/dL   Calcium 8.4 (L) 8.9 - 10.3 mg/dL   Total Protein 6.6 6.5 - 8.1 g/dL   Albumin 3.8 3.5 - 5.0 g/dL   AST 23 15 - 41 U/L   ALT 18 14 - 54 U/L   Alkaline Phosphatase 42 38 -  126 U/L   Total Bilirubin 0.4 0.3 - 1.2 mg/dL   GFR calc non Af Amer >60 >60 mL/min   GFR calc Af Amer >60 >60 mL/min   Anion gap 8 5 - 15  Lipase, blood     Status: None   Collection Time: 09/04/15  2:20 AM  Result Value Ref Range   Lipase 27 11 - 51 U/L  CBC with Differential     Status: Abnormal   Collection Time: 09/04/15  2:20 AM  Result Value Ref Range   WBC 6.9 4.0 - 10.5 K/uL   RBC 3.82 (L) 3.87 - 5.11 MIL/uL   Hemoglobin 12.2 12.0 - 15.0 g/dL   HCT 45.4 (L) 09.8 - 11.9 %   MCV 92.7 78.0 - 100.0 fL   MCH 31.9 26.0 - 34.0 pg   MCHC 34.5 30.0 - 36.0 g/dL   RDW 14.7 82.9 - 56.2 %   Platelets 245 150 -  400 K/uL   Neutrophils Relative % 62 %   Neutro Abs 4.3 1.7 - 7.7 K/uL   Lymphocytes Relative 29 %   Lymphs Abs 2.0 0.7 - 4.0 K/uL   Monocytes Relative 8 %   Monocytes Absolute 0.6 0.1 - 1.0 K/uL   Eosinophils Relative 1 %   Eosinophils Absolute 0.0 0.0 - 0.7 K/uL   Basophils Relative 0 %   Basophils Absolute 0.0 0.0 - 0.1 K/uL  Pregnancy, urine     Status: None   Collection Time: 09/04/15  2:20 AM  Result Value Ref Range   Preg Test, Ur NEGATIVE NEGATIVE  Wet prep, genital     Status: Abnormal   Collection Time: 09/04/15  2:35 AM  Result Value Ref Range   Yeast Wet Prep HPF POC NONE SEEN NONE SEEN   Trich, Wet Prep NONE SEEN NONE SEEN   Clue Cells Wet Prep HPF POC NONE SEEN NONE SEEN   WBC, Wet Prep HPF POC MODERATE (A) NONE SEEN   Sperm NONE SEEN      MDM   Discussed this patient with Dr. Elesa Massed and she will assume care of the patient.  IV fluids started, Phenergan 12.5 mg IV and Pepcid 20 mg IV Observation.     [facility] Orlene Och, NP 09/04/15 0255  Layla Maw Ward, DO 09/04/15 5621

## 2015-09-04 NOTE — ED Notes (Signed)
Pt c/o generalized abdominal pain that started x 1 day ago; pt states she is nauseous, denies any vomiting

## 2015-09-04 NOTE — Discharge Instructions (Signed)
Abdominal Pain, Adult °Many things can cause abdominal pain. Usually, abdominal pain is not caused by a disease and will improve without treatment. It can often be observed and treated at home. Your health care provider will do a physical exam and possibly order blood tests and X-rays to help determine the seriousness of your pain. However, in many cases, more time must pass before a clear cause of the pain can be found. Before that point, your health care provider may not know if you need more testing or further treatment. °HOME CARE INSTRUCTIONS °Monitor your abdominal pain for any changes. The following actions may help to alleviate any discomfort you are experiencing: °· Only take over-the-counter or prescription medicines as directed by your health care provider. °· Do not take laxatives unless directed to do so by your health care provider. °· Try a clear liquid diet (broth, tea, or water) as directed by your health care provider. Slowly move to a bland diet as tolerated. °SEEK MEDICAL CARE IF: °· You have unexplained abdominal pain. °· You have abdominal pain associated with nausea or diarrhea. °· You have pain when you urinate or have a bowel movement. °· You experience abdominal pain that wakes you in the night. °· You have abdominal pain that is worsened or improved by eating food. °· You have abdominal pain that is worsened with eating fatty foods. °· You have a fever. °SEEK IMMEDIATE MEDICAL CARE IF: °· Your pain does not go away within 2 hours. °· You keep throwing up (vomiting). °· Your pain is felt only in portions of the abdomen, such as the right side or the left lower portion of the abdomen. °· You pass bloody or black tarry stools. °MAKE SURE YOU: °· Understand these instructions. °· Will watch your condition. °· Will get help right away if you are not doing well or get worse. °  °This information is not intended to replace advice given to you by your health care provider. Make sure you discuss  any questions you have with your health care provider. °  °Document Released: 04/17/2005 Document Revised: 03/29/2015 Document Reviewed: 03/17/2013 °Elsevier Interactive Patient Education ©2016 [employer] ° °Diarrhea °Diarrhea is frequent loose and watery bowel movements. It can cause you to feel weak and dehydrated. Dehydration can cause you to become tired and thirsty, have a dry mouth, and have decreased urination that often is dark yellow. Diarrhea is a sign of another problem, most often an infection that will not last long. In most cases, diarrhea typically lasts 2-3 days. However, it can last longer if it is a sign of something more serious. It is important to treat your diarrhea as directed by your caregiver to lessen or prevent future episodes of diarrhea. °CAUSES  °Some common causes include: °· Gastrointestinal infections caused by viruses, bacteria, or parasites. °· Food poisoning or food allergies. °· Certain medicines, such as antibiotics, chemotherapy, and laxatives. °· Artificial sweeteners and fructose. °· Digestive disorders. °HOME CARE INSTRUCTIONS °· Ensure adequate fluid intake (hydration): Have 1 cup (8 oz) of fluid for each diarrhea episode. Avoid fluids that contain simple sugars or sports drinks, fruit juices, whole milk products, and sodas. Your urine should be clear or pale yellow if you are drinking enough fluids. Hydrate with an oral rehydration solution that you can purchase at pharmacies, retail stores, and online. You can prepare an oral rehydration solution at home by mixing the following ingredients together: °¨  - tsp table salt. °¨ ¾ tsp baking soda. °¨    tsp salt substitute containing potassium chloride. °¨ 1  tablespoons sugar. °¨ 1 L (34 oz) of water. °· Certain foods and beverages may increase the speed at which food moves through the gastrointestinal (GI) tract. These foods and beverages should be avoided and include: °¨ Caffeinated and alcoholic beverages. °¨ High-fiber  foods, such as raw fruits and vegetables, nuts, seeds, and whole grain breads and cereals. °¨ Foods and beverages sweetened with sugar alcohols, such as xylitol, sorbitol, and mannitol. °· Some foods may be well tolerated and may help thicken stool including: °¨ Starchy foods, such as rice, toast, pasta, low-sugar cereal, oatmeal, grits, baked potatoes, crackers, and bagels. °¨ Bananas. °¨ Applesauce. °· Add probiotic-rich foods to help increase healthy bacteria in the GI tract, such as yogurt and fermented milk products. °· Wash your hands well after each diarrhea episode. °· Only take over-the-counter or prescription medicines as directed by your caregiver. °· Take a warm bath to relieve any burning or pain from frequent diarrhea episodes. °SEEK IMMEDIATE MEDICAL CARE IF:  °· You are unable to keep fluids down. °· You have persistent vomiting. °· You have blood in your stool, or your stools are black and tarry. °· You do not urinate in 6-8 hours, or there is only a small amount of very dark urine. °· You have abdominal pain that increases or localizes. °· You have weakness, dizziness, confusion, or light-headedness. °· You have a severe headache. °· Your diarrhea gets worse or does not get better. °· You have a fever or persistent symptoms for more than 2-3 days. °· You have a fever and your symptoms suddenly get worse. °MAKE SURE YOU:  °· Understand these instructions. °· Will watch your condition. °· Will get help right away if you are not doing well or get worse. °  °This information is not intended to replace advice given to you by your health care provider. Make sure you discuss any questions you have with your health care provider. °  °Document Released: 06/28/2002 Document Revised: 07/29/2014 Document Reviewed: 03/15/2012 °Elsevier Interactive Patient Education ©2016 [employer] Inc. ° °

## 2015-09-05 LAB — GC/CHLAMYDIA PROBE AMP (~~LOC~~) NOT AT ARMC
CHLAMYDIA, DNA PROBE: NEGATIVE
NEISSERIA GONORRHEA: NEGATIVE

## 2015-10-02 ENCOUNTER — Emergency Department (HOSPITAL_COMMUNITY)
Admission: EM | Admit: 2015-10-02 | Discharge: 2015-10-02 | Disposition: A | Discharge status: Home / Self Care | Payer: Medicaid Other | Attending: Emergency Medicine | Admitting: Emergency Medicine

## 2015-10-02 ENCOUNTER — Encounter (HOSPITAL_COMMUNITY): Payer: Self-pay | Admitting: Emergency Medicine

## 2015-10-02 DIAGNOSIS — K011 Impacted teeth: Secondary | ICD-10-CM | POA: Insufficient documentation

## 2015-10-02 MED ORDER — CLINDAMYCIN HCL 150 MG PO CAPS: 150.0000 mg | ORAL_CAPSULE | Freq: Four times a day (QID) | ORAL | Status: DC

## 2015-10-02 MED ORDER — KETOROLAC TROMETHAMINE 60 MG/2ML IM SOLN
60.0000 mg | Freq: Once | INTRAMUSCULAR | Status: AC
  Administered 2015-10-02: 60 mg via INTRAMUSCULAR
  Filled 2015-10-02: qty 2

## 2015-10-02 NOTE — ED Notes (Signed)
Pt c/o right lower toothache x 2 weeks.

## 2015-10-02 NOTE — ED Provider Notes (Signed)
CSN: [id number]     Arrival date & time 10/02/15  0940 History   First MD Initiated Contact with Patient 10/02/15 [phone]     Chief Complaint  Patient presents with  . Dental Pain    HPI Comments: 25 year old female presents with dental pain for 2 weeks. Pain is coming from her back right wisdom tooth which has been present for over a year. The pain is throbbing. States she has not been able to eat on the right side. She feels like her jaw alignment is off and has self-reported R jaw swelling. Denies fever, chills. Has tried Naproxen and Tramadol with no relief. Last seen a dentist 3 years ago.    The history is provided by the patient.    History reviewed. No pertinent past medical history. History reviewed. No pertinent past surgical history. No family history on file. Social History  Substance Use Topics  . Smoking status: Never Smoker   . Smokeless tobacco: None  . Alcohol Use: No   OB History    Gravida Para Term Preterm AB TAB SAB Ectopic Multiple Living            1     Review of Systems  Constitutional: Negative for fever and chills.  HENT: Positive for dental problem.       Allergies  Penicillins  Home Medications   Prior to Admission medications   Medication Sig Start Date End Date Taking? Authorizing Provider  dicyclomine (BENTYL) 20 MG tablet Take 1 tablet (20 mg total) by mouth 3 (three) times daily before meals. As needed for abdominal pain 09/04/15   Kristen N Ward, DO  ibuprofen (ADVIL,MOTRIN) 800 MG tablet Take 1 tablet (800 mg total) by mouth every 8 (eight) hours as needed for mild pain. 09/04/15   Kristen N Ward, DO  loperamide (IMODIUM) 2 MG capsule Take 1 capsule (2 mg total) by mouth 4 (four) times daily as needed for diarrhea or loose stools. 09/04/15   Kristen N Ward, DO  LORazepam (ATIVAN) 1 MG tablet Take 1 tablet (1 mg total) by mouth 2 (two) times daily as needed for anxiety. 06/19/15   Shon Batonourtney F Horton, MD  ondansetron (ZOFRAN ODT) 4 MG  disintegrating tablet Take 1 tablet (4 mg total) by mouth every 8 (eight) hours as needed for nausea or vomiting. 09/04/15   Kristen N Ward, DO   BP 133/83 mmHg  Pulse 65  Temp(Src) 98.3 F (36.8 C)  Resp 18  Ht 5\' 3"  (1.6 m)  Wt 52.164 kg  BMI 20.38 kg/m2  SpO2 100%  LMP 10/02/2015 Physical Exam  Constitutional: She is oriented to person, place, and time. She appears well-developed and well-nourished. No distress.  HENT:  Head: Normocephalic and atraumatic.  Mouth/Throat: Uvula is midline, oropharynx is clear and moist and mucous membranes are normal.    No malocclusion, trismus, swelling of jaw. Back right wisdom tooth appears impacted  Pulmonary/Chest: Effort normal.  Neurological: She is alert and oriented to person, place, and time.  Skin: Skin is warm and dry.  Psychiatric: She has a normal mood and affect.    ED Course  Procedures (including critical care time)  60mg  IM Toradol given  MDM   Final diagnoses:  Tooth impaction   25 year old female presents with impacted wisdom tooth. Pt is without abscess, trismus, or malocclusion of jaw. Since she has not been to the dentist in several years advised to f/u. Will treat for infection with clinda and  advised patient to take ibuprofen prn for pain.  Bethel Born, PA-C 10/02/15 1052  Eber Hong, MD 10/02/15 [phone]

## 2015-10-02 NOTE — ED Provider Notes (Signed)
The patient is a 25 year old female, she presents with lower dental pain, she is known to have all of her wisdom teeth, she has not had dental surgery, she has had multiple cavities filled when she was younger and had braces. She reports this pain is subacute to chronic, it seems to be worse over the last 2 weeks, on exam she has no trismus, no torticollis, no tenderness under the tongue, normal phonation, no fevers, mild tenderness over the said wisdom teeth. No obvious cracks, no obvious swelling, no obvious redness, no drainage. The patient likely has pain related to impacted wisdom teeth, possibly pulpitis, she will need dental follow-up, she has been informed that we will treat her pain with anti-inflammatories.  Medical screening examination/treatment/procedure(s) were conducted as a shared visit with non-physician practitioner(s) and myself.  I personally evaluated the patient during the encounter.  Clinical Impression:   Final diagnoses:  Tooth impaction         Eber HongBrian Kinzee Happel, MD 10/02/15 [phone]

## 2015-10-02 NOTE — Discharge Instructions (Signed)
Dental Care and Dentist Visits °Dental care supports good overall health. Regular dental visits can also help you avoid dental pain, bleeding, infection, and other more serious health problems in the future. It is important to keep the mouth healthy because diseases in the teeth, gums, and other oral tissues can spread to other areas of the body. Some problems, such as diabetes, heart disease, and pre-term labor have been associated with poor oral health.  °See your dentist every 6 months. If you experience emergency problems such as a toothache or broken tooth, go to the dentist right away. If you see your dentist regularly, you may catch problems early. It is easier to be treated for problems in the early stages.  °WHAT TO EXPECT AT A DENTIST VISIT  °Your dentist will look for many common oral health problems and recommend proper treatment. At your regular dental visit, you can expect: °· Gentle cleaning of the teeth and gums. This includes scraping and polishing. This helps to remove the sticky substance around the teeth and gums (plaque). Plaque forms in the mouth shortly after eating. Over time, plaque hardens on the teeth as tartar. If tartar is not removed regularly, it can cause problems. Cleaning also helps remove stains. °· Periodic X-rays. These pictures of the teeth and supporting bone will help your dentist assess the health of your teeth. °· Periodic fluoride treatments. Fluoride is a natural mineral shown to help strengthen teeth. Fluoride treatment involves applying a fluoride gel or varnish to the teeth. It is most commonly done in children. °· Examination of the mouth, tongue, jaws, teeth, and gums to look for any oral health problems, such as: °¨ Cavities (dental caries). This is decay on the tooth caused by plaque, sugar, and acid in the mouth. It is best to catch a cavity when it is small. °¨ Inflammation of the gums caused by plaque buildup (gingivitis). °¨ Problems with the mouth or malformed  or misaligned teeth. °¨ Oral cancer or other diseases of the soft tissues or jaws.  °KEEP YOUR TEETH AND GUMS HEALTHY °For healthy teeth and gums, follow these general guidelines as well as your dentist's specific advice: °· Have your teeth professionally cleaned at the dentist every 6 months. °· Brush twice daily with a fluoride toothpaste. °· Floss your teeth daily.  °· Ask your dentist if you need fluoride supplements, treatments, or fluoride toothpaste. °· Eat a healthy diet. Reduce foods and drinks with added sugar. °· Avoid smoking. °TREATMENT FOR ORAL HEALTH PROBLEMS °If you have oral health problems, treatment varies depending on the conditions present in your teeth and gums. °· Your caregiver will most likely recommend good oral hygiene at each visit. °· For cavities, gingivitis, or other oral health disease, your caregiver will perform a procedure to treat the problem. This is typically done at a separate appointment. Sometimes your caregiver will refer you to another dental specialist for specific tooth problems or for surgery. °SEEK IMMEDIATE DENTAL CARE IF: °· You have pain, bleeding, or soreness in the gum, tooth, jaw, or mouth area. °· A permanent tooth becomes loose or separated from the gum socket. °· You experience a blow or injury to the mouth or jaw area. °  °This information is not intended to replace advice given to you by your health care provider. Make sure you discuss any questions you have with your health care provider. °  °Document Released: 03/20/2011 Document Revised: 09/30/2011 Document Reviewed: 03/20/2011 °Elsevier Interactive Patient Education ©2016 [employer]. ° °

## 2015-12-18 ENCOUNTER — Emergency Department (HOSPITAL_COMMUNITY)
Admission: EM | Admit: 2015-12-18 | Discharge: 2015-12-19 | Disposition: A | Discharge status: Home / Self Care | Payer: Medicaid Other | Attending: Emergency Medicine | Admitting: Emergency Medicine

## 2015-12-18 ENCOUNTER — Encounter (HOSPITAL_COMMUNITY): Payer: Self-pay | Admitting: Emergency Medicine

## 2015-12-18 DIAGNOSIS — O200 Threatened abortion: Secondary | ICD-10-CM

## 2015-12-18 DIAGNOSIS — Z3A01 Less than 8 weeks gestation of pregnancy: Secondary | ICD-10-CM | POA: Insufficient documentation

## 2015-12-18 DIAGNOSIS — R52 Pain, unspecified: Secondary | ICD-10-CM

## 2015-12-18 NOTE — ED Notes (Signed)
Pt c/o vaginal bleeding and lower abd pain x 40minutes. Pt states she found out she was pregnant Friday.

## 2015-12-19 ENCOUNTER — Emergency Department (HOSPITAL_COMMUNITY): Payer: Medicaid Other

## 2015-12-19 LAB — CBC WITH DIFFERENTIAL/PLATELET
BASOS PCT: 0 %
Basophils Absolute: 0 10*3/uL (ref 0.0–0.1)
EOS ABS: 0.1 10*3/uL (ref 0.0–0.7)
EOS PCT: 1 %
HCT: 34.6 % — ABNORMAL LOW (ref 36.0–46.0)
Hemoglobin: 12 g/dL (ref 12.0–15.0)
LYMPHS PCT: 23 %
Lymphs Abs: 2.7 10*3/uL (ref 0.7–4.0)
MCH: 31.9 pg (ref 26.0–34.0)
MCHC: 34.7 g/dL (ref 30.0–36.0)
MCV: 92 fL (ref 78.0–100.0)
MONO ABS: 0.9 10*3/uL (ref 0.1–1.0)
Monocytes Relative: 8 %
Neutro Abs: 7.8 10*3/uL — ABNORMAL HIGH (ref 1.7–7.7)
Neutrophils Relative %: 68 %
PLATELETS: 254 10*3/uL (ref 150–400)
RBC: 3.76 MIL/uL — AB (ref 3.87–5.11)
RDW: 11.4 % — AB (ref 11.5–15.5)
WBC: 11.5 10*3/uL — AB (ref 4.0–10.5)

## 2015-12-19 LAB — URINALYSIS, ROUTINE W REFLEX MICROSCOPIC
BILIRUBIN URINE: NEGATIVE
Glucose, UA: NEGATIVE mg/dL
Leukocytes, UA: NEGATIVE
NITRITE: NEGATIVE
PROTEIN: 100 mg/dL — AB
Specific Gravity, Urine: 1.02 (ref 1.005–1.030)
pH: 6 (ref 5.0–8.0)

## 2015-12-19 LAB — URINE MICROSCOPIC-ADD ON

## 2015-12-19 LAB — BASIC METABOLIC PANEL
Anion gap: 3 — ABNORMAL LOW (ref 5–15)
BUN: 12 mg/dL (ref 6–20)
CO2: 26 mmol/L (ref 22–32)
CREATININE: 0.76 mg/dL (ref 0.44–1.00)
Calcium: 8.4 mg/dL — ABNORMAL LOW (ref 8.9–10.3)
Chloride: 108 mmol/L (ref 101–111)
GFR calc Af Amer: >60 mL/min (ref >60)
GLUCOSE: 94 mg/dL (ref 65–99)
POTASSIUM: 4 mmol/L (ref 3.5–5.1)
SODIUM: 137 mmol/L (ref 135–145)

## 2015-12-19 LAB — WET PREP, GENITAL
CLUE CELLS WET PREP: NONE SEEN
Sperm: NONE SEEN
Trich, Wet Prep: NONE SEEN
Yeast Wet Prep HPF POC: NONE SEEN

## 2015-12-19 LAB — PREGNANCY, URINE: PREG TEST UR: POSITIVE — AB

## 2015-12-19 LAB — ABO/RH: ABO/RH(D): A POS

## 2015-12-19 LAB — HCG, QUANTITATIVE, PREGNANCY: HCG, BETA CHAIN, QUANT, S: 3837 m[IU]/mL — AB (ref <5)

## 2015-12-19 MED ORDER — PRENATAL COMPLETE 14-0.4 MG PO TABS: 1.0000 | ORAL_TABLET | Freq: Every day | ORAL | Status: DC

## 2015-12-19 NOTE — Discharge Instructions (Signed)
Threatened Miscarriage Your ultrasound shows a very early pregnancy. Follow up with the OB doctor in 2 days for a repeat blood test. You also need a repeat ultrasound in 1 week. Return to the ED if you develop worsening pain, bleeding, or any other concerns. A threatened miscarriage occurs when you have vaginal bleeding during your first 20 weeks of pregnancy but the pregnancy has not ended. If you have vaginal bleeding during this time, your health care provider will do tests to make sure you are still pregnant. If the tests show you are still pregnant and the developing baby (fetus) inside your womb (uterus) is still growing, your condition is considered a threatened miscarriage. A threatened miscarriage does not mean your pregnancy will end, but it does increase the risk of losing your pregnancy (complete miscarriage). CAUSES  The cause of a threatened miscarriage is usually not known. If you go on to have a complete miscarriage, the most common cause is an abnormal number of chromosomes in the developing baby. Chromosomes are the structures inside cells that hold all your genetic material. Some causes of vaginal bleeding that do not result in miscarriage include:  Having sex.  Having an infection.  Normal hormone changes of pregnancy.  Bleeding that occurs when an egg implants in your uterus. RISK FACTORS Risk factors for bleeding in early pregnancy include:  Obesity.  Smoking.  Drinking excessive amounts of alcohol or caffeine.  Recreational drug use. SIGNS AND SYMPTOMS  Light vaginal bleeding.  Mild abdominal pain or cramps. DIAGNOSIS  If you have bleeding with or without abdominal pain before 20 weeks of pregnancy, your health care provider will do tests to check whether you are still pregnant. One important test involves using sound waves and a computer (ultrasound) to create images of the inside of your uterus. Other tests include an internal exam of your vagina and uterus  (pelvic exam) and measurement of your baby's heart rate.  You may be diagnosed with a threatened miscarriage if:  Ultrasound testing shows you are still pregnant.  Your baby's heart rate is strong.  A pelvic exam shows that the opening between your uterus and your vagina (cervix) is closed.  Your heart rate and blood pressure are stable.  Blood tests confirm you are still pregnant. TREATMENT  No treatments have been shown to prevent a threatened miscarriage from going on to a complete miscarriage. However, the right home care is important.  HOME CARE INSTRUCTIONS   Make sure you keep all your appointments for prenatal care. This is very important.  Get plenty of rest.  Do not have sex or use tampons if you have vaginal bleeding.  Do not douche.  Do not smoke or use recreational drugs.  Do not drink alcohol.  Avoid caffeine. SEEK MEDICAL CARE IF:  You have light vaginal bleeding or spotting while pregnant.  You have abdominal pain or cramping.  You have a fever. SEEK IMMEDIATE MEDICAL CARE IF:  You have heavy vaginal bleeding.  You have blood clots coming from your vagina.  You have severe low back pain or abdominal cramps.  You have fever, chills, and severe abdominal pain. MAKE SURE YOU:  Understand these instructions.  Will watch your condition.  Will get help right away if you are not doing well or get worse.   This information is not intended to replace advice given to you by your health care provider. Make sure you discuss any questions you have with your health care provider.  Document Released: 07/08/2005 Document Revised: 07/13/2013 Document Reviewed: 05/04/2013 Elsevier Interactive Patient Education [employer]2016 Elsevier Inc.

## 2015-12-19 NOTE — ED Notes (Signed)
Pt alert & oriented x4, stable gait. Patient given discharge instructions, paperwork & prescription(s). Patient  instructed to stop at the registration desk to finish any additional paperwork. Patient verbalized understanding. Pt left department w/ no further questions. 

## 2015-12-19 NOTE — ED Provider Notes (Signed)
Ultrasound results show likely intrauterine gestational sac. No fetal pole or yolk sac identified. approximately [redacted] weeks gestation. Hemoglobin stable. Rh positive. No role for rhogam. HCG 3837  Bacteruria on UA, no symptoms. Culture sent.  Discussed with patient threatened miscarriage. Needs follow-up hCG in 2 days as well as repeat ultrasound in 1 week. Discussed that this pregnancy may develop normally and may not. OB follow up given. Return precautions discussed.  BP 102/67 mmHg  Pulse 72  Temp(Src) 98.4 F (36.9 C)  Resp 20  Ht 5\' 4"  (1.626 m)  Wt 107 lb (48.535 kg)  BMI 18.36 kg/m2  SpO2 100%  LMP 11/18/2015     Glynn OctaveStephen Geet Hosking, MD 12/19/15 [phone]

## 2015-12-19 NOTE — ED Provider Notes (Signed)
CSN: [id number]     Arrival date & time 12/18/15  2309 History   First MD Initiated Contact with Patient 12/18/15 2359     Chief Complaint  Patient presents with  . Vaginal Bleeding     (Consider location/radiation/quality/duration/timing/severity/associated sxs/prior Treatment) HPI   Robyn Willis is a 25 y.o. female G2P1AB0, who presents to the Emergency Department complaining of sudden onset of lower abdominal cramping and vaginal bleeding that began one and half hours ago.  She states that she had a confirmed pregnancy by urine test at the health dept three days ago.  She reports heavy bleeding with several clots passed.  She brings a clot with her that she believes is a fetus.  She denies fever, vomiting, dizziness, syncope, dysuria.      History reviewed. No pertinent past medical history. History reviewed. No pertinent past surgical history. History reviewed. No pertinent family history. Social History  Substance Use Topics  . Smoking status: Never Smoker   . Smokeless tobacco: None  . Alcohol Use: No   OB History    Gravida Para Term Preterm AB TAB SAB Ectopic Multiple Living            1     Review of Systems  Constitutional: Negative for fever and chills.  Respiratory: Negative for shortness of breath.   Gastrointestinal: Positive for abdominal pain. Negative for nausea and vomiting.  Genitourinary: Positive for vaginal bleeding and vaginal pain. Negative for dysuria, vaginal discharge and difficulty urinating.  Skin: Negative for rash.  Neurological: Negative for dizziness, weakness and headaches.  All other systems reviewed and are negative.     Allergies  Penicillins  Home Medications   Prior to Admission medications   Medication Sig Start Date End Date Taking? Authorizing Provider  clindamycin (CLEOCIN) 150 MG capsule Take 1 capsule (150 mg total) by mouth every 6 (six) hours. 10/02/15   Bethel Born, PA-C   BP 136/87 mmHg  Pulse 110   Temp(Src) 98.4 F (36.9 C)  Resp 20  Ht  (1.626 m)  Wt 48.535 kg  BMI 18.36 kg/m2  SpO2 100%  LMP 11/18/2015 Physical Exam  Constitutional: She is oriented to person, place, and time. She appears well-developed and well-nourished. No distress.  HENT:  Head: Normocephalic and atraumatic.  Mouth/Throat: Oropharynx is clear and moist.  Cardiovascular: Normal rate, regular rhythm, normal heart sounds and intact distal pulses.   No murmur heard. Pulmonary/Chest: Effort normal and breath sounds normal. No respiratory distress.  Abdominal: Soft. Normal appearance and bowel sounds are normal. She exhibits no distension and no mass. There is tenderness in the right lower quadrant and suprapubic area. There is no rebound, no guarding and no CVA tenderness.    Genitourinary: Uterus normal. Right adnexum displays tenderness. Right adnexum displays no mass. Left adnexum displays no mass and no tenderness. There is bleeding in the vagina.  Mild amt of blood in the vaginal vault.  Cervical os is closed.  No clots seen on exam.  Musculoskeletal: Normal range of motion. She exhibits no edema.  Neurological: She is alert and oriented to person, place, and time. She exhibits normal muscle tone. Coordination normal.  Skin: Skin is warm and dry.  Psychiatric: She has a normal mood and affect.  Nursing note and vitals reviewed.   ED Course  Procedures (including critical care time) Labs Review Labs Reviewed  WET PREP, GENITAL - Abnormal; Notable for the following:    WBC, Wet Prep HPF POC  FEW (*)    All other components within normal limits  URINALYSIS, ROUTINE W REFLEX MICROSCOPIC (NOT AT [facility]) - Abnormal; Notable for the following:    Hgb urine dipstick SMALL (*)    Ketones, ur TRACE (*)    Protein, ur 100 (*)    All other components within normal limits  PREGNANCY, URINE - Abnormal; Notable for the following:    Preg Test, Ur POSITIVE (*)    All other components within normal limits  CBC  WITH DIFFERENTIAL/PLATELET - Abnormal; Notable for the following:    WBC 11.5 (*)    RBC 3.76 (*)    HCT 34.6 (*)    RDW 11.4 (*)    Neutro Abs 7.8 (*)    All other components within normal limits  HCG, QUANTITATIVE, PREGNANCY - Abnormal; Notable for the following:    hCG, Beta Chain, Quant, S 3837 (*)    All other components within normal limits  BASIC METABOLIC PANEL - Abnormal; Notable for the following:    Calcium 8.4 (*)    Anion gap 3 (*)    All other components within normal limits  URINE MICROSCOPIC-ADD ON - Abnormal; Notable for the following:    Squamous Epithelial / LPF 0-5 (*)    Bacteria, UA MANY (*)    All other components within normal limits  ABO/RH  GC/CHLAMYDIA PROBE AMP ([place]) NOT AT [facility]    Imaging Review No results found. I have personally reviewed and evaluated these images and lab results as part of my medical decision-making.   EKG Interpretation None      MDM   Final diagnoses:  None    Pt well appearing.  Vitals stable.  Ambulates in the dept without difficulty.    0125  End of shift, labs pending,  pt signed out to Dr. Manus Gunningancour.  OB US ordered to r/o ectopic.  patient resting comfortably      Pauline Ausammy Adaleena Mooers, PA-C 12/19/15 0153  Glynn OctaveStephen Rancour, MD 12/19/15 [phone]

## 2015-12-19 NOTE — ED Notes (Signed)
Pt states she started having abdominal pain that started about 1 1/2 hours ago. Pt states she found out Friday she was about [redacted] week pregnant. Pt believes she passed a clot containing her fetus. Pt bought it to the ER with her.

## 2015-12-20 LAB — GC/CHLAMYDIA PROBE AMP (~~LOC~~) NOT AT ARMC
CHLAMYDIA, DNA PROBE: NEGATIVE
Neisseria Gonorrhea: NEGATIVE

## 2015-12-20 LAB — URINE CULTURE

## 2015-12-22 ENCOUNTER — Encounter: Payer: Self-pay | Admitting: Obstetrics & Gynecology

## 2015-12-22 ENCOUNTER — Ambulatory Visit (INDEPENDENT_AMBULATORY_CARE_PROVIDER_SITE_OTHER): Payer: Medicaid Other | Admitting: Obstetrics & Gynecology

## 2015-12-22 VITALS — BP 110/80 | HR 74 | Ht 64.0 in | Wt 108.0 lb

## 2015-12-22 DIAGNOSIS — O200 Threatened abortion: Secondary | ICD-10-CM | POA: Diagnosis not present

## 2015-12-22 NOTE — Progress Notes (Signed)
Patient ID: Robyn Willis, female   DOB: [birth year], 25 y.o.   MRN: [id number]   Chief Complaint  Patient presents with  . follow-up ER    [redacted] week pregnant/ vaginal bleeding/ spotting today.     HPI:    25 y.o. No obstetric history on file. Patient's last menstrual period was 11/18/2015.  Was seen at ED with quantitative HCG 3837 and sonogram revealing no definitive pregnancy status Still having bleeding/spotting 5 days ago, sometimes heavier sometimes not at ll Some cramping asociated     Current outpatient prescriptions:  .  clindamycin (CLEOCIN) 150 MG capsule, Take 1 capsule (150 mg total) by mouth every 6 (six) hours., Disp: 28 capsule, Rfl: 0 .  Prenatal Vit-Fe Fumarate-FA (PRENATAL COMPLETE) 14-0.4 MG TABS, Take 1 tablet by mouth daily. (Patient not taking: Reported on 12/22/2015), Disp: 60 each, Rfl: 0  Problem Pertinent ROS:       No burning with urination, frequency or urgency No nausea, vomiting or diarrhea Nor fever chills or other constitutional symptoms   Extended ROS:        PMFSH:             History reviewed. No pertinent past medical history.  History reviewed. No pertinent past surgical history.  OB History    Gravida Para Term Preterm AB TAB SAB Ectopic Multiple Living            1      Allergies  Allergen Reactions  . Penicillins Hives    Social History   Social History  . Marital Status: Single    Spouse Name: N/A  . Number of Children: N/A  . Years of Education: N/A   Social History Main Topics  . Smoking status: Never Smoker   . Smokeless tobacco: None  . Alcohol Use: No  . Drug Use: No  . Sexual Activity: No   Other Topics Concern  . None   Social History Narrative    History reviewed. No pertinent family history.   Examination:  Vitals:  Blood pressure 110/80, pulse 74, height 5\' 4"  (1.626 m), weight 108 lb (48.988 kg), last menstrual period 11/18/2015.    Physical Examination:      Labs:  No results found  for this or any previous visit (from the past 72 hour(s)).   DATA orders and reviews: Labs were ordered today:   Orders Placed This Encounter  Procedures  . Beta HCG, Quant    Imaging studies were not ordered today:    Lab tests were reviewed today:    Imaging studies were reviewed today:    I did independently review/view images, tracing or specimen(not simply the report) myself.    Prescription Drug Management:   Prescriptions prescribed this encounter:   No orders of the defined types were placed in this encounter.    Renewed Prescriptions:  none  Current prescription changes:  none   Impression/Plan(Problem Based):  1. Threatened abortion in early pregnancy   Follow Up:   Return if symptoms worsen or fail to improve.      Face to face time:  20 minutes  Greater than 50% of the visit time was spent in counseling and coordination of care with the patient.  The summary and outline of the counseling and care coordination is summarized in the note above.   All questions were answered.

## 2015-12-23 LAB — BETA HCG QUANT (REF LAB): hCG Quant: 6102 m[IU]/mL

## 2015-12-29 ENCOUNTER — Ambulatory Visit: Payer: Medicaid Other | Admitting: Obstetrics and Gynecology

## 2015-12-29 ENCOUNTER — Encounter: Payer: Self-pay | Admitting: *Deleted

## 2016-01-01 ENCOUNTER — Ambulatory Visit: Payer: Medicaid Other | Admitting: Obstetrics & Gynecology

## 2016-01-01 ENCOUNTER — Encounter: Payer: Self-pay | Admitting: Obstetrics & Gynecology

## 2016-01-02 ENCOUNTER — Ambulatory Visit (INDEPENDENT_AMBULATORY_CARE_PROVIDER_SITE_OTHER): Payer: Medicaid Other | Admitting: Adult Health

## 2016-01-02 ENCOUNTER — Encounter: Payer: Self-pay | Admitting: Adult Health

## 2016-01-02 VITALS — BP 100/62 | HR 76 | Ht 64.0 in | Wt 107.0 lb

## 2016-01-02 DIAGNOSIS — Z3201 Encounter for pregnancy test, result positive: Secondary | ICD-10-CM | POA: Diagnosis not present

## 2016-01-02 DIAGNOSIS — N926 Irregular menstruation, unspecified: Secondary | ICD-10-CM

## 2016-01-02 DIAGNOSIS — O0990 Supervision of high risk pregnancy, unspecified, unspecified trimester: Secondary | ICD-10-CM | POA: Insufficient documentation

## 2016-01-02 DIAGNOSIS — O3680X0 Pregnancy with inconclusive fetal viability, not applicable or unspecified: Secondary | ICD-10-CM

## 2016-01-02 DIAGNOSIS — Z3490 Encounter for supervision of normal pregnancy, unspecified, unspecified trimester: Secondary | ICD-10-CM

## 2016-01-02 LAB — POCT URINE PREGNANCY: Preg Test, Ur: POSITIVE — AB

## 2016-01-02 NOTE — Patient Instructions (Addendum)
First Trimester of Pregnancy The first trimester of pregnancy is from week 1 until the end of week 12 (months 1 through 3). A week after a sperm fertilizes an egg, the egg will implant on the wall of the uterus. This embryo will begin to develop into a baby. Genes from you and your partner are forming the baby. The female genes determine whether the baby is a boy or a girl. At 6-8 weeks, the eyes and face are formed, and the heartbeat can be seen on ultrasound. At the end of 12 weeks, all the baby's organs are formed.  Now that you are pregnant, you will want to do everything you can to have a healthy baby. Two of the most important things are to get good prenatal care and to follow your health care provider's instructions. Prenatal care is all the medical care you receive before the baby's birth. This care will help prevent, find, and treat any problems during the pregnancy and childbirth. BODY CHANGES Your body goes through many changes during pregnancy. The changes vary from woman to woman.   You may gain or lose a couple of pounds at first.  You may feel sick to your stomach (nauseous) and throw up (vomit). If the vomiting is uncontrollable, call your health care provider.  You may tire easily.  You may develop headaches that can be relieved by medicines approved by your health care provider.  You may urinate more often. Painful urination may mean you have a bladder infection.  You may develop heartburn as a result of your pregnancy.  You may develop constipation because certain hormones are causing the muscles that push waste through your intestines to slow down.  You may develop hemorrhoids or swollen, bulging veins (varicose veins).  Your breasts may begin to grow larger and become tender. Your nipples may stick out more, and the tissue that surrounds them (areola) may become darker.  Your gums may bleed and may be sensitive to brushing and flossing.  Dark spots or blotches (chloasma,  mask of pregnancy) may develop on your face. This will likely fade after the baby is born.  Your menstrual periods will stop.  You may have a loss of appetite.  You may develop cravings for certain kinds of food.  You may have changes in your emotions from day to day, such as being excited to be pregnant or being concerned that something may go wrong with the pregnancy and baby.  You may have more vivid and strange dreams.  You may have changes in your hair. These can include thickening of your hair, rapid growth, and changes in texture. Some women also have hair loss during or after pregnancy, or hair that feels dry or thin. Your hair will most likely return to normal after your baby is born. WHAT TO EXPECT AT YOUR PRENATAL VISITS During a routine prenatal visit:  You will be weighed to make sure you and the baby are growing normally.  Your blood pressure will be taken.  Your abdomen will be measured to track your baby's growth.  The fetal heartbeat will be listened to starting around week 10 or 12 of your pregnancy.  Test results from any previous visits will be discussed. Your health care provider may ask you:  How you are feeling.  If you are feeling the baby move.  If you have had any abnormal symptoms, such as leaking fluid, bleeding, severe headaches, or abdominal cramping.  If you are using any tobacco products,   including cigarettes, chewing tobacco, and electronic cigarettes.  If you have any questions. Other tests that may be performed during your first trimester include:  Blood tests to find your blood type and to check for the presence of any previous infections. They will also be used to check for low iron levels (anemia) and Rh antibodies. Later in the pregnancy, blood tests for diabetes will be done along with other tests if problems develop.  Urine tests to check for infections, diabetes, or protein in the urine.  An ultrasound to confirm the proper growth  and development of the baby.  An amniocentesis to check for possible genetic problems.  Fetal screens for spina bifida and Down syndrome.  You may need other tests to make sure you and the baby are doing well.  HIV (human immunodeficiency virus) testing. Routine prenatal testing includes screening for HIV, unless you choose not to have this test. HOME CARE INSTRUCTIONS  Medicines  Follow your health care provider's instructions regarding medicine use. Specific medicines may be either safe or unsafe to take during pregnancy.  Take your prenatal vitamins as directed.  If you develop constipation, try taking a stool softener if your health care provider approves. Diet  Eat regular, well-balanced meals. Choose a variety of foods, such as meat or vegetable-based protein, fish, milk and low-fat dairy products, vegetables, fruits, and whole grain breads and cereals. Your health care provider will help you determine the amount of weight gain that is right for you.  Avoid raw meat and uncooked cheese. These carry germs that can cause birth defects in the baby.  Eating four or five small meals rather than three large meals a day may help relieve nausea and vomiting. If you start to feel nauseous, eating a few soda crackers can be helpful. Drinking liquids between meals instead of during meals also seems to help nausea and vomiting.  If you develop constipation, eat more high-fiber foods, such as fresh vegetables or fruit and whole grains. Drink enough fluids to keep your urine clear or pale yellow. Activity and Exercise  Exercise only as directed by your health care provider. Exercising will help you:  Control your weight.  Stay in shape.  Be prepared for labor and delivery.  Experiencing pain or cramping in the lower abdomen or low back is a good sign that you should stop exercising. Check with your health care provider before continuing normal exercises.  Try to avoid standing for long  periods of time. Move your legs often if you must stand in one place for a long time.  Avoid heavy lifting.  Wear low-heeled shoes, and practice good posture.  You may continue to have sex unless your health care provider directs you otherwise. Relief of Pain or Discomfort  Wear a good support bra for breast tenderness.   Take warm sitz baths to soothe any pain or discomfort caused by hemorrhoids. Use hemorrhoid cream if your health care provider approves.   Rest with your legs elevated if you have leg cramps or low back pain.  If you develop varicose veins in your legs, wear support hose. Elevate your feet for 15 minutes, 3-4 times a day. Limit salt in your diet. Prenatal Care  Schedule your prenatal visits by the twelfth week of pregnancy. They are usually scheduled monthly at first, then more often in the last 2 months before delivery.  Write down your questions. Take them to your prenatal visits.  Keep all your prenatal visits as directed by your   health care provider. Safety  Wear your seat belt at all times when driving.  Make a list of emergency phone numbers, including numbers for family, friends, the hospital, and police and fire departments. General Tips  Ask your health care provider for a referral to a local prenatal education class. Begin classes no later than at the beginning of month 6 of your pregnancy.  Ask for help if you have counseling or nutritional needs during pregnancy. Your health care provider can offer advice or refer you to specialists for help with various needs.  Do not use hot tubs, steam rooms, or saunas.  Do not douche or use tampons or scented sanitary pads.  Do not cross your legs for long periods of time.  Avoid cat litter boxes and soil used by cats. These carry germs that can cause birth defects in the baby and possibly loss of the fetus by miscarriage or stillbirth.  Avoid all smoking, herbs, alcohol, and medicines not prescribed by  your health care provider. Chemicals in these affect the formation and growth of the baby.  Do not use any tobacco products, including cigarettes, chewing tobacco, and electronic cigarettes. If you need help quitting, ask your health care provider. You may receive counseling support and other resources to help you quit.  Schedule a dentist appointment. At home, brush your teeth with a soft toothbrush and be gentle when you floss. SEEK MEDICAL CARE IF:   You have dizziness.  You have mild pelvic cramps, pelvic pressure, or nagging pain in the abdominal area.  You have persistent nausea, vomiting, or diarrhea.  You have a bad smelling vaginal discharge.  You have pain with urination.  You notice increased swelling in your face, hands, legs, or ankles. SEEK IMMEDIATE MEDICAL CARE IF:   You have a fever.  You are leaking fluid from your vagina.  You have spotting or bleeding from your vagina.  You have severe abdominal cramping or pain.  You have rapid weight gain or loss.  You vomit blood or material that looks like coffee grounds.  You are exposed to [place] measles and have never had them.  You are exposed to fifth disease or chickenpox.  You develop a severe headache.  You have shortness of breath.  You have any kind of trauma, such as from a fall or a car accident.   This information is not intended to replace advice given to you by your health care provider. Make sure you discuss any questions you have with your health care provider.   Document Released: 07/02/2001 Document Revised: 07/29/2014 Document Reviewed: 05/18/2013 Elsevier Interactive Patient Education [employer] Elsevier Inc. Return in 2 days for [place]

## 2016-01-02 NOTE — Progress Notes (Signed)
Subjective:     Patient ID: Robyn Willis, female   DOB: Apr 22, 1991, 25 y.o.   MRN: [id number]  HPI Robyn Willis is a 25 year old black female, last seen 6/2 for spotting in early pregnancy, and had QHCG of 6102 that day, no bleeding since, has some nausea.  Review of Systems +nausea,Patient denies any headaches, hearing loss, fatigue, blurred vision, shortness of breath, chest pain, abdominal pain, problems with bowel movements, urination, or intercourse. No joint pain or mood swings. Reviewed past medical,surgical, social and family history. Reviewed medications and allergies.     Objective:   Physical Exam BP 100/62 mmHg  Pulse 76  Ht 5\' 4"  (1.626 m)  Wt 107 lb (48.535 kg)  BMI 18.36 kg/m2  LMP 11/18/2015 UPT + about 6 +3 weeks by LMP with EDD 08/24/16, Skin warm and dry. Neck: mid line trachea, normal thyroid, good ROM, no lymphadenopathy noted. Lungs: clear to ausculation bilaterally. Cardiovascular: regular rate and rhythm.Abdomen is soft and mildly tender. Will check labs today and schedule US.    Assessment:     UPT+ Pregnant     Plan:     Check QHCG and progesterone and ABO rh Return in 2 days for dating US Eat often and push fluids Review handout on first trimester

## 2016-01-03 ENCOUNTER — Other Ambulatory Visit: Payer: Self-pay | Admitting: Obstetrics and Gynecology

## 2016-01-03 ENCOUNTER — Telehealth: Payer: Self-pay | Admitting: Adult Health

## 2016-01-03 DIAGNOSIS — O3680X0 Pregnancy with inconclusive fetal viability, not applicable or unspecified: Secondary | ICD-10-CM

## 2016-01-03 LAB — PROGESTERONE: PROGESTERONE: 12.3 ng/mL

## 2016-01-03 LAB — ABO AND RH: Rh Factor: POSITIVE

## 2016-01-03 LAB — BETA HCG QUANT (REF LAB): HCG QUANT: 41476 m[IU]/mL

## 2016-01-03 NOTE — Telephone Encounter (Signed)
Left message with her mom to call me  °

## 2016-01-04 ENCOUNTER — Ambulatory Visit (INDEPENDENT_AMBULATORY_CARE_PROVIDER_SITE_OTHER): Payer: Medicaid Other

## 2016-01-04 DIAGNOSIS — O3680X0 Pregnancy with inconclusive fetal viability, not applicable or unspecified: Secondary | ICD-10-CM | POA: Diagnosis not present

## 2016-01-04 DIAGNOSIS — Z3A01 Less than 8 weeks gestation of pregnancy: Secondary | ICD-10-CM

## 2016-01-04 NOTE — Progress Notes (Signed)
US 6+5 wks,single IUP w/ys,pos fht 137 bpm,normal ov's bilat,crl 9.2 mm

## 2016-01-15 ENCOUNTER — Encounter: Payer: Medicaid Other | Admitting: Women's Health

## 2016-01-15 ENCOUNTER — Encounter: Payer: Self-pay | Admitting: *Deleted

## 2016-01-18 ENCOUNTER — Emergency Department (HOSPITAL_COMMUNITY)
Admission: EM | Admit: 2016-01-18 | Discharge: 2016-01-18 | Disposition: A | Discharge status: Home / Self Care | Payer: Medicaid Other | Attending: Obstetrics and Gynecology | Admitting: Obstetrics and Gynecology

## 2016-01-18 ENCOUNTER — Emergency Department (HOSPITAL_COMMUNITY)
Admission: EM | Admit: 2016-01-18 | Discharge: 2016-01-18 | Discharge status: Against Medical Advice | Payer: Medicaid Other

## 2016-01-18 ENCOUNTER — Encounter (HOSPITAL_COMMUNITY): Payer: Self-pay | Admitting: Cardiology

## 2016-01-18 DIAGNOSIS — N939 Abnormal uterine and vaginal bleeding, unspecified: Secondary | ICD-10-CM

## 2016-01-18 DIAGNOSIS — R55 Syncope and collapse: Secondary | ICD-10-CM

## 2016-01-18 DIAGNOSIS — R252 Cramp and spasm: Secondary | ICD-10-CM | POA: Diagnosis not present

## 2016-01-18 DIAGNOSIS — D62 Acute posthemorrhagic anemia: Secondary | ICD-10-CM | POA: Insufficient documentation

## 2016-01-18 DIAGNOSIS — I951 Orthostatic hypotension: Secondary | ICD-10-CM | POA: Insufficient documentation

## 2016-01-18 DIAGNOSIS — O036 Delayed or excessive hemorrhage following complete or unspecified spontaneous abortion: Secondary | ICD-10-CM | POA: Diagnosis not present

## 2016-01-18 DIAGNOSIS — O039 Complete or unspecified spontaneous abortion without complication: Secondary | ICD-10-CM

## 2016-01-18 DIAGNOSIS — Z79899 Other long term (current) drug therapy: Secondary | ICD-10-CM | POA: Insufficient documentation

## 2016-01-18 DIAGNOSIS — O26891 Other specified pregnancy related conditions, first trimester: Secondary | ICD-10-CM | POA: Insufficient documentation

## 2016-01-18 LAB — BASIC METABOLIC PANEL
Anion gap: 8 (ref 5–15)
BUN: 5 mg/dL — ABNORMAL LOW (ref 6–20)
CALCIUM: 8.8 mg/dL — AB (ref 8.9–10.3)
CO2: 24 mmol/L (ref 22–32)
CREATININE: 0.49 mg/dL (ref 0.44–1.00)
Chloride: 104 mmol/L (ref 101–111)
Glucose, Bld: 118 mg/dL — ABNORMAL HIGH (ref 65–99)
Potassium: 3.7 mmol/L (ref 3.5–5.1)
SODIUM: 136 mmol/L (ref 135–145)

## 2016-01-18 LAB — I-STAT BETA HCG BLOOD, ED (MC, WL, AP ONLY)

## 2016-01-18 LAB — CBC
HCT: 35.5 % — ABNORMAL LOW (ref 36.0–46.0)
HEMATOCRIT: 25.2 % — AB (ref 36.0–46.0)
HEMOGLOBIN: 9 g/dL — AB (ref 12.0–15.0)
Hemoglobin: 12.3 g/dL (ref 12.0–15.0)
MCH: 31.6 pg (ref 26.0–34.0)
MCH: 32.8 pg (ref 26.0–34.0)
MCHC: 34.6 g/dL (ref 30.0–36.0)
MCHC: 35.7 g/dL (ref 30.0–36.0)
MCV: 91.3 fL (ref 78.0–100.0)
MCV: 92 fL (ref 78.0–100.0)
PLATELETS: 283 10*3/uL (ref 150–400)
Platelets: 244 10*3/uL (ref 150–400)
RBC: 3.89 MIL/uL (ref 3.87–5.11)
RBC: 2.74 MIL/uL — AB (ref 3.87–5.11)
RDW: 11.5 % (ref 11.5–15.5)
RDW: 11.6 % (ref 11.5–15.5)
WBC: 18.6 10*3/uL — AB (ref 4.0–10.5)
WBC: 26.2 10*3/uL — AB (ref 4.0–10.5)

## 2016-01-18 LAB — HCG, QUANTITATIVE, PREGNANCY: HCG, BETA CHAIN, QUANT, S: 107896 m[IU]/mL — AB (ref <5)

## 2016-01-18 MED ORDER — DOXYCYCLINE HYCLATE 100 MG PO TABS: 100.0000 mg | ORAL_TABLET | Freq: Two times a day (BID) | ORAL | Status: DC

## 2016-01-18 MED ORDER — DOXYCYCLINE HYCLATE 100 MG PO TABS
200.0000 mg | ORAL_TABLET | Freq: Once | ORAL | Status: AC
Start: 2016-01-18 — End: 2016-01-18
  Administered 2016-01-18: 200 mg via ORAL
  Filled 2016-01-18: qty 2

## 2016-01-18 MED ORDER — OXYTOCIN 40 UNITS IN LACTATED RINGERS INFUSION - SIMPLE MED: 20.0000 [IU]/h | INTRAVENOUS | Status: DC

## 2016-01-18 MED ORDER — HYDROCODONE-ACETAMINOPHEN 5-325 MG PO TABS: 1.0000 | ORAL_TABLET | Freq: Four times a day (QID) | ORAL | Status: DC | PRN

## 2016-01-18 MED ORDER — OXYTOCIN 10 UNIT/ML IJ SOLN
INTRAVENOUS | Status: DC
  Administered 2016-01-18: 14:00:00 via INTRAVENOUS
  Filled 2016-01-18 (×13): qty 1000

## 2016-01-18 MED ORDER — SODIUM CHLORIDE 0.9 % IV SOLN
1000.0000 mL | Freq: Once | INTRAVENOUS | Status: AC
  Administered 2016-01-18: 1000 mL via INTRAVENOUS

## 2016-01-18 MED ORDER — LIDOCAINE HCL (PF) 1 % IJ SOLN
5.0000 mL | Freq: Once | INTRAMUSCULAR | Status: AC
  Administered 2016-01-18: 5 mL via INTRADERMAL
  Filled 2016-01-18: qty 5

## 2016-01-18 MED ORDER — ONDANSETRON HCL 4 MG/2ML IJ SOLN
4.0000 mg | Freq: Once | INTRAMUSCULAR | Status: AC
  Administered 2016-01-18: 4 mg via INTRAVENOUS
  Filled 2016-01-18: qty 2

## 2016-01-18 MED ORDER — LACTATED RINGERS IV BOLUS (SEPSIS)
1000.0000 mL | Freq: Once | INTRAVENOUS | Status: AC
  Administered 2016-01-18: 1000 mL via INTRAVENOUS

## 2016-01-18 MED ORDER — CEFTRIAXONE SODIUM 250 MG IJ SOLR
250.0000 mg | Freq: Once | INTRAMUSCULAR | Status: AC
  Administered 2016-01-18: 250 mg via INTRAMUSCULAR
  Filled 2016-01-18: qty 250

## 2016-01-18 MED ORDER — ACETAMINOPHEN 500 MG PO TABS
1000.0000 mg | ORAL_TABLET | Freq: Once | ORAL | Status: AC
  Administered 2016-01-18: 1000 mg via ORAL
  Filled 2016-01-18: qty 2

## 2016-01-18 MED ORDER — OXYTOCIN 10 UNIT/ML IJ SOLN
INTRAVENOUS | Status: DC
  Filled 2016-01-18 (×13): qty 1000

## 2016-01-18 MED ORDER — MORPHINE SULFATE (PF) 2 MG/ML IV SOLN
2.0000 mg | Freq: Once | INTRAVENOUS | Status: AC
  Administered 2016-01-18: 2 mg via INTRAVENOUS
  Filled 2016-01-18: qty 1

## 2016-01-18 MED ORDER — MISOPROSTOL 100 MCG PO TABS
800.0000 ug | ORAL_TABLET | Freq: Once | ORAL | Status: AC
  Administered 2016-01-18: 800 ug via ORAL
  Filled 2016-01-18: qty 8

## 2016-01-18 NOTE — ED Notes (Signed)
Pt made aware to return if symptoms worsen or if any life threatening symptoms occur.   

## 2016-01-18 NOTE — Consult Note (Signed)
Asked to see this 25 year old female after orthostatic signs obtained at 1:00 PM, after 2 L fluids and by mouth Cytotec after the patient had,, and to the ED with bleeding and expelled products of conception which were extracted with ring forceps along with 300-700 cc of blood and fluid earlier this morning At my arrival patient is somewhat pale but has adequate capillary refill. Vital signs recorded elsewhere pulse in the 80s she is alert and oriented hemoglobin dropped from 12.3 initially to 9.0. Blood type is A+ Second IV line is restarted with saline bolus 1 L plus a second bag of lactated Ringer's with 40 units of Pitocin to run over 2 hours. Speculum exam was initiated the patient was uncomfortable due to full bladder Patient s bladder had finally filled and so she was stable standing this time and was allowed into the bladder before its exam could B completed. Ultrasound shows the 10 uterine cavity is almost into there is a 1 cm line of tissue and debris Cervix is still somewhat open and ring forceps could be used to probe the endometrial cavity and extract some decidua clots and tissue remnants. Patient had earlier passed a 9 mm to 10 mm long fetal pole which was in the room dating the time of fetal loss to approximate 7-1/2 weeks Patient having some cramping Pitocin in the procedure and will continue to be hydrated and will be discharged after these 2 L of run in. Patient will use barrier contraception 1 month and resume fertility efforts prenatal vitamins are encouraged while attempting conception. Follow-up 2 weeks [facility]

## 2016-01-18 NOTE — ED Notes (Signed)
[redacted] weeks pregnant.  Abdominal cramping and vaginal bleeding since last night

## 2016-01-18 NOTE — Discharge Instructions (Signed)
Miscarriage  A miscarriage is the sudden loss of an unborn baby (fetus) before the 20th week of pregnancy. Most miscarriages happen in the first 3 months of pregnancy. Sometimes, it happens before a woman even knows she is pregnant. A miscarriage is also called a "spontaneous miscarriage" or "early pregnancy loss." Having a miscarriage can be an emotional experience. Talk with your caregiver about any questions you may have about miscarrying, the grieving process, and your future pregnancy plans.  CAUSES    Problems with the fetal chromosomes that make it impossible for the baby to develop normally. Problems with the baby's genes or chromosomes are most often the result of errors that occur, by chance, as the embryo divides and grows. The problems are not inherited from the parents.   Infection of the cervix or uterus.    Hormone problems.    Problems with the cervix, such as having an incompetent cervix. This is when the tissue in the cervix is not strong enough to hold the pregnancy.    Problems with the uterus, such as an abnormally shaped uterus, uterine fibroids, or congenital abnormalities.    Certain medical conditions.    Smoking, drinking alcohol, or taking illegal drugs.    Trauma.   Often, the cause of a miscarriage is unknown.   SYMPTOMS    Vaginal bleeding or spotting, with or without cramps or pain.   Pain or cramping in the abdomen or lower back.   Passing fluid, tissue, or blood clots from the vagina.  DIAGNOSIS   Your caregiver will perform a physical exam. You may also have an ultrasound to confirm the miscarriage. Blood or urine tests may also be ordered.  TREATMENT    Sometimes, treatment is not necessary if you naturally pass all the fetal tissue that was in the uterus. If some of the fetus or placenta remains in the body (incomplete miscarriage), tissue left behind may become infected and must be removed. Usually, a dilation and curettage (D and C) procedure is performed.  During a D and C procedure, the cervix is widened (dilated) and any remaining fetal or placental tissue is gently removed from the uterus.   Antibiotic medicines are prescribed if there is an infection. Other medicines may be given to reduce the size of the uterus (contract) if there is a lot of bleeding.   If you have Rh negative blood and your baby was Rh positive, you will need a Rh immunoglobulin shot. This shot will protect any future baby from having Rh blood problems in future pregnancies.  HOME CARE INSTRUCTIONS    Your caregiver may order bed rest or may allow you to continue light activity. Resume activity as directed by your caregiver.   Have someone help with home and family responsibilities during this time.    Keep track of the number of sanitary pads you use each day and how soaked (saturated) they are. Write down this information.    Do not use tampons. Do not douche or have sexual intercourse until approved by your caregiver.    Only take over-the-counter or prescription medicines for pain or discomfort as directed by your caregiver.    Do not take aspirin. Aspirin can cause bleeding.    Keep all follow-up appointments with your caregiver.    If you or your partner have problems with grieving, talk to your caregiver or seek counseling to help cope with the pregnancy loss. Allow enough time to grieve before trying to get pregnant again.     SEEK IMMEDIATE MEDICAL CARE IF:    You have severe cramps or pain in your back or abdomen.   You have a fever.   You pass large blood clots (walnut-sized or larger) ortissue from your vagina. Save any tissue for your caregiver to inspect.    Your bleeding increases.    You have a thick, bad-smelling vaginal discharge.   You become lightheaded, weak, or you faint.    You have chills.   MAKE SURE YOU:   Understand these instructions.   Will watch your condition.   Will get help right away if you are not doing well or get worse.     This  information is not intended to replace advice given to you by your health care provider. Make sure you discuss any questions you have with your health care provider.     Document Released: 01/01/2001 Document Revised: 11/02/2012 Document Reviewed: 08/27/2011  Elsevier Interactive Patient Education 2016 [employer]

## 2016-01-18 NOTE — ED Notes (Signed)
PA at bedside.

## 2016-01-18 NOTE — ED Notes (Signed)
Patient walked with minimal assistance to the bathroom.

## 2016-01-18 NOTE — ED Notes (Signed)
Dr. Ferguson at bedside. 

## 2016-01-18 NOTE — ED Notes (Signed)
Hannah, pa notified of orthostatic vital signs.  Pt felt like she was going to pass out when standing up.

## 2016-01-18 NOTE — ED Notes (Signed)
Pt reports heavy bleeding.  Noticed big clots on blue pads and floor. Nurse informed.

## 2016-01-18 NOTE — ED Notes (Signed)
Pt sleeping at this time.

## 2016-01-18 NOTE — ED Notes (Signed)
Dr. Emelda FearFerguson at bedside with US, pelvic performed. Pt also walked to bathroom with no difficulty.

## 2016-01-18 NOTE — ED Provider Notes (Signed)
CSN: [id number]     Arrival date & time 01/18/16  [id number] History   First MD Initiated Contact with Patient 01/18/16 [phone]     Chief Complaint  Patient presents with  . Vaginal Bleeding     (Consider location/radiation/quality/duration/timing/severity/associated sxs/prior Treatment) The history is provided by the patient and medical records. No language interpreter was used.     Robyn Willis is a 25 y.o. female  G2P1AB0 with no major medical problems presents to the Emergency Department complaining of gradual, persistent, progressively worsening lower abd cramping and associated vaginal bleeding.  Pt reports the pain began around 11pm.  She came to the ED but left without being seen around 2am.  Pt reports that at 4am she began to have a large volume of vaginal bleeding with clots.  Record review shows that pt was evaluated for a similar episode on 12/19/15.  US at that time showed probable IUP and f/u US on 6/15 confirmed this.  Pt was seen by OB/GYN in follow-up at that time.  Pt reports heavy bleeding and large clots since 4am.  She also endorses severe lower abd pain.  Pt denies fever, chills, headache, neck pain, chest pain, SOB, vomiting, diarrhea, weakness, dizziness, syncope, dysuria, hematuria.       Past Medical History  Diagnosis Date  . Pregnant 01/02/2016   History reviewed. No pertinent past surgical history. Family History  Problem Relation Age of Onset  . Kidney disease Mother   . Diabetes Mother   . Hypertension Mother   . Diabetes Father   . Hypertension Father   . Heart disease Maternal Grandmother   . Alzheimer's disease Paternal Grandmother    Social History  Substance Use Topics  . Smoking status: Never Smoker   . Smokeless tobacco: Never Used  . Alcohol Use: No   OB History    Gravida Para Term Preterm AB TAB SAB Ectopic Multiple Living   2 1        1      Review of Systems  Constitutional: Negative for fever, diaphoresis, appetite change, fatigue and  unexpected weight change.  HENT: Negative for mouth sores.   Eyes: Negative for visual disturbance.  Respiratory: Negative for cough, chest tightness, shortness of breath and wheezing.   Cardiovascular: Negative for chest pain.  Gastrointestinal: Positive for abdominal pain. Negative for nausea, vomiting, diarrhea and constipation.  Endocrine: Negative for polydipsia, polyphagia and polyuria.  Genitourinary: Positive for vaginal bleeding and pelvic pain. Negative for dysuria, urgency, frequency and hematuria.  Musculoskeletal: Negative for back pain and neck stiffness.  Skin: Negative for rash.  Allergic/Immunologic: Negative for immunocompromised state.  Neurological: Negative for syncope, light-headedness and headaches.  Hematological: Does not bruise/bleed easily.  Psychiatric/Behavioral: Negative for sleep disturbance. The patient is not nervous/anxious.       Allergies  Penicillins  Home Medications   Prior to Admission medications   Medication Sig Start Date End Date Taking? Authorizing Provider  metroNIDAZOLE (FLAGYL) 500 MG tablet Take 500 mg by mouth 2 (two) times daily.   Yes Historical Provider, MD  Prenatal Vit-Fe Fumarate-FA (PRENATAL COMPLETE) 14-0.4 MG TABS Take 1 tablet by mouth daily. 12/19/15  Yes Glynn OctaveStephen Rancour, MD   BP 117/81 mmHg  Pulse 106  Temp(Src) 98.4 F (36.9 C) (Oral)  Resp 18  Ht 5\' 4"  (1.626 m)  Wt 49.442 kg  BMI 18.70 kg/m2  SpO2 100%  LMP 11/18/2015 Physical Exam  Constitutional: She appears well-developed and well-nourished. No distress.  HENT:  Head:  Normocephalic and atraumatic.  Eyes: Conjunctivae are normal. No scleral icterus.  Neck: Normal range of motion. Neck supple.  Cardiovascular: Regular rhythm, normal heart sounds and intact distal pulses.  Tachycardia present.   Pulses:      Radial pulses are 2+ on the right side, and 2+ on the left side.  Pulmonary/Chest: Effort normal and breath sounds normal. No respiratory distress. She  has no wheezes.  Abdominal: Soft. Bowel sounds are normal. She exhibits no mass. There is no hepatosplenomegaly. There is tenderness in the right lower quadrant, suprapubic area and left lower quadrant. There is no rebound, no guarding and no CVA tenderness.    Genitourinary: Pelvic exam was performed with patient supine. No labial fusion. There is no rash, tenderness, lesion or injury on the right labia. There is no rash, tenderness, lesion or injury on the left labia. Uterus is tender. Uterus is not deviated, not enlarged and not fixed. Cervix exhibits no motion tenderness, no discharge and no friability. Right adnexum displays no mass, no tenderness and no fullness. Left adnexum displays no mass, no tenderness and no fullness. There is bleeding (copious amounts) in the vagina. No erythema or tenderness in the vagina. No foreign body around the vagina. No signs of injury around the vagina. No vaginal discharge found.  Vaginal vault with large amounts of blood and blood clots   Musculoskeletal: Normal range of motion. She exhibits no edema.  Lymphadenopathy:    She has no cervical adenopathy.  Neurological: She is alert.  Speech is clear and goal oriented Moves extremities without ataxia  Skin: Skin is warm and dry. No rash noted. She is not diaphoretic.  Psychiatric: She has a normal mood and affect.  Nursing note and vitals reviewed.   ED Course  Procedures (including critical care time) Labs Review Labs Reviewed  BASIC METABOLIC PANEL - Abnormal; Notable for the following:    Glucose, Bld 118 (*)    BUN 5 (*)    Calcium 8.8 (*)    All other components within normal limits  CBC - Abnormal; Notable for the following:    WBC 18.6 (*)    HCT 35.5 (*)    All other components within normal limits  HCG, QUANTITATIVE, PREGNANCY - Abnormal; Notable for the following:    hCG, Beta Francene Finders [id number] (*)    All other components within normal limits  CBC - Abnormal; Notable for the  following:    WBC 26.2 (*)    RBC 2.74 (*)    Hemoglobin 9.0 (*)    HCT 25.2 (*)    All other components within normal limits  I-STAT BETA HCG BLOOD, ED ([facility], [facility], [facility] ONLY) - Abnormal; Notable for the following:    I-stat hCG, quantitative >2000.0 (*)    All other components within normal limits     CRITICAL CARE Performed by: Dierdre Forth Total critical care time: 45 minutes Critical care time was exclusive of separately billable procedures and treating other patients. Critical care was necessary to treat or prevent imminent or life-threatening deterioration. Critical care was time spent personally by me on the following activities: development of treatment plan with patient and/or surrogate as well as nursing, discussions with consultants, evaluation of patient's response to treatment, examination of patient, obtaining history from patient or surrogate, ordering and performing treatments and interventions, ordering and review of laboratory studies, ordering and review of radiographic studies, pulse oximetry and re-evaluation of patient's condition.   MDM   Final diagnoses:  Spontaneous abortion  Vaginal bleeding  Orthostatic hypotension  Near syncope  Anemia associated with acute blood loss   Senaya D Frimpong presents with vaginal bleeding and lower abd cramping.  Pt with positive pregnancy test and confirmed IUP on US several weeks ago.  On pelvic exam, pt with copious amounts of blood i the vaginal vault.  Approx 300ml of blood evacuated along with a number of blood clots.  Fetus was identified and removed from the vaginal vault with ring forceups.  Pt with products of conception in the cervical os with a large amount of bleeding around the site.  Several large pieces of products of conception removed from the os with ring forceups.  Cervical os is open and clear of products of conception at the end of the procedure.  Long discussion of the procedure and confirmation of  spontaneous abortion with the patient who is understandably upset, but does understand.  Pt's sister at bedside for the discussion.    11:17 AM Discussed with Dr. Despina HiddenEure, pt's OB/GYN who recommends cytotec 800mcg in the ED and QD x 3 days.  He will see her in f/u next week unless she has complications prior.    BP 120/74 mmHg  Pulse 102  Temp(Src) 98.4 F (36.9 C) (Oral)  Resp 18  Ht 5\' 4"  (1.626 m)  Wt 49.442 kg  BMI 18.70 kg/m2  SpO2 100%  LMP 11/18/2015   12:05 PM  Orthostatic VS for the past 24 hrs:  BP- Lying Pulse- Lying BP- Sitting Pulse- Sitting BP- Standing at 0 minutes Pulse- Standing at 0 minutes  01/18/16 1141 120/72 mmHg 92 102/70 mmHg 113 (!) 89/62 mmHg 135   Pt persistently orthostatic with near syncope on standing after 2 liters of fluid.  Repeat CBC pending.  Will reconsult OB as pt will need admission.     1:17 PM Pt's hgb has dropped from 12.3 to 9.0 during her time in the ED.  Discussed with Dr. Emelda FearFerguson who will evaluate in the ED.     4:46 PM Pt evaluated by Dr. Emelda FearFerguson who has assumed care of the patient.  She was given pitocin and additional fluids in the ED.  Additionally, she developed a fever of 101.2.  Dr. Emelda FearFerguson aware.    5:19 PM Pt given Rocephin in the ED and will be d/c home with Doxycycline.    Dahlia ClientHannah Mailen Newborn, PA-C 01/18/16 1719  Linwood DibblesJon Knapp, MD 01/20/16 [phone]

## 2016-01-18 NOTE — ED Notes (Signed)
Pt has no suspected reaction to antibiotic. Denies itching or rash.

## 2016-01-18 NOTE — ED Notes (Signed)
Pt states she is leaving she has an appt in am.

## 2016-01-18 NOTE — Progress Notes (Signed)
Subjective: Patient reports cramping. Wants something for home.    Objective: I have reviewed patient's vital signs and medications. BP 114/58 mmHg  Pulse 88  Temp(Src) 98.4 F (36.9 C) (Oral)  Resp 16  Ht 5\' 4"  (1.626 m)  Wt 49.442 kg (109 lb)  BMI 18.70 kg/m2  SpO2 100%  LMP 11/18/2015 dicharge temp after iv d/c'd 101.2, will Rx doxy x 5 days as precaution givend 200 mg po now. General: alert, cooperative, appears stated age and no distress Resp: clear to auscultation bilaterally GI: soft, non-tender; bowel sounds normal; no masses,  no organomegaly Vaginal Bleeding: minimal   Assessment/Plan: S.p spontaneous ab. Plan: iron          Vits          Doxycycline x 5 d          F/u 2 wk or prn continued temp.      Shigeo Baugh V 01/18/2016, 4:42 PM

## 2016-01-24 ENCOUNTER — Telehealth: Payer: Self-pay | Admitting: Obstetrics and Gynecology

## 2016-05-06 NOTE — Telephone Encounter (Signed)
Pt contacted to see that all was fine p misc. Late documentation

## 2016-05-10 ENCOUNTER — Emergency Department (HOSPITAL_COMMUNITY)
Admission: EM | Admit: 2016-05-10 | Discharge: 2016-05-10 | Disposition: A | Discharge status: Home / Self Care | Payer: Medicaid Other | Attending: Emergency Medicine | Admitting: Emergency Medicine

## 2016-05-10 ENCOUNTER — Encounter (HOSPITAL_COMMUNITY): Payer: Self-pay | Admitting: Emergency Medicine

## 2016-05-10 DIAGNOSIS — B9689 Other specified bacterial agents as the cause of diseases classified elsewhere: Secondary | ICD-10-CM

## 2016-05-10 DIAGNOSIS — N898 Other specified noninflammatory disorders of vagina: Secondary | ICD-10-CM | POA: Diagnosis present

## 2016-05-10 DIAGNOSIS — N760 Acute vaginitis: Secondary | ICD-10-CM | POA: Insufficient documentation

## 2016-05-10 LAB — WET PREP, GENITAL
Sperm: NONE SEEN
Trich, Wet Prep: NONE SEEN
YEAST WET PREP: NONE SEEN

## 2016-05-10 LAB — URINALYSIS, ROUTINE W REFLEX MICROSCOPIC
Bilirubin Urine: NEGATIVE
Glucose, UA: NEGATIVE mg/dL
Hgb urine dipstick: NEGATIVE
Ketones, ur: NEGATIVE mg/dL
LEUKOCYTES UA: NEGATIVE
NITRITE: NEGATIVE
Protein, ur: NEGATIVE mg/dL
SPECIFIC GRAVITY, URINE: 1.015 (ref 1.005–1.030)
pH: 7 (ref 5.0–8.0)

## 2016-05-10 LAB — POC URINE PREG, ED: PREG TEST UR: NEGATIVE

## 2016-05-10 MED ORDER — METRONIDAZOLE 0.75 % EX GEL: CUTANEOUS | 0 refills | Status: DC

## 2016-05-10 NOTE — Discharge Instructions (Signed)
Follow-up with one of the clinics listed.  °

## 2016-05-10 NOTE — ED Triage Notes (Signed)
Pt c/o white vaginal discharge, nausea and states she wants a pregnancy test.

## 2016-05-13 LAB — GC/CHLAMYDIA PROBE AMP (~~LOC~~) NOT AT ARMC
CHLAMYDIA, DNA PROBE: NEGATIVE
NEISSERIA GONORRHEA: NEGATIVE

## 2016-05-13 NOTE — ED Provider Notes (Signed)
[facility]-EMER[facility]ENCY DEPT Provider Note   CSN: [id number] Arrival date & time: 05/10/16  2026     History   Chief Complaint Chief Complaint  Patient presents with  . Vaginal Discharge    HPI Robyn Willis is a 25 y.o. female.  HPI   Robyn Willis is a 25 y.o. female who presents to the Emergency Department complaining of vaginal discharge, nausea and requests a pregnancy test.  She reports vaginal discharge is associated with a odor and symptoms seem similar to previous BV infection. No new sexual partners or vaginal lesions.  She denies abd pain, vomiting, fever, dysuria, back pain and vaginal bleeding.   Past Medical History:  Diagnosis Date  . Pregnant 01/02/2016    Patient Active Problem List   Diagnosis Date Noted  . Pregnant 01/02/2016    History reviewed. No pertinent surgical history.  OB History    Gravida Para Term Preterm AB Living   2 1       1    SAB TAB Ectopic Multiple Live Births                   Home Medications    Prior to Admission medications   Medication Sig Start Date End Date Taking? Authorizing Provider  doxycycline (VIBRA-TABS) 100 MG tablet Take 1 tablet (100 mg total) by mouth 2 (two) times daily. 01/18/16   Hannah Muthersbaugh, PA-C  HYDROcodone-acetaminophen (NORCO/VICODIN) 5-325 MG tablet Take 1 tablet by mouth every 6 (six) hours as needed for moderate pain. May take with ibuprofen 01/18/16   Hannah Muthersbaugh, PA-C  metroNIDAZOLE (FLAGYL) 500 MG tablet Take 500 mg by mouth 2 (two) times daily.    Historical Provider, MD  metroNIDAZOLE (METROGEL) 0.75 % gel 1 applicator vaginally at bedtime for 5 days 05/10/16   Pauline Aus, PA-C  Prenatal Vit-Fe Fumarate-FA (PRENATAL COMPLETE) 14-0.4 MG TABS Take 1 tablet by mouth daily. 12/19/15   Glynn Octave, MD    Family History Family History  Problem Relation Age of Onset  . Kidney disease Mother   . Diabetes Mother   . Hypertension Mother   . Diabetes Father   . Hypertension  Father   . Heart disease Maternal Grandmother   . Alzheimer's disease Paternal Grandmother     Social History Social History  Substance Use Topics  . Smoking status: Never Smoker  . Smokeless tobacco: Never Used  . Alcohol use Yes     Allergies   Penicillins   Review of Systems Review of Systems  Constitutional: Negative for appetite change, chills and fever.  Respiratory: Negative for chest tightness and shortness of breath.   Cardiovascular: Negative for chest pain.  Gastrointestinal: Positive for nausea. Negative for abdominal pain and vomiting.  Genitourinary: Positive for vaginal discharge. Negative for decreased urine volume, dysuria, flank pain, hematuria, pelvic pain and vaginal bleeding.  Musculoskeletal: Negative for arthralgias and neck pain.  Skin: Negative for rash.  Neurological: Negative for dizziness, weakness, numbness and headaches.     Physical Exam Updated Vital Signs BP 126/76   Pulse 95   Temp 98.5 F (36.9 C)   Resp 17   Ht 5\' 3"  (1.6 m)   Wt 49.9 kg   LMP 04/20/2016   SpO2 100%   Breastfeeding? Unknown   BMI 19.49 kg/m   Physical Exam  Constitutional: She is oriented to person, place, and time. She appears well-developed and well-nourished. No distress.  HENT:  Head: Normocephalic.  Mouth/Throat: Oropharynx is clear and moist.  Cardiovascular: Normal rate and regular rhythm.   Pulmonary/Chest: Effort normal. No respiratory distress.  Abdominal: Soft. She exhibits no distension. There is no tenderness. There is no guarding.  Genitourinary: Uterus normal. There is no lesion on the right labia. There is no lesion on the left labia. Cervix exhibits no motion tenderness and no friability. Right adnexum displays no mass and no tenderness. Left adnexum displays no mass and no tenderness. No tenderness or bleeding in the vagina. No foreign body in the vagina. Vaginal discharge found.  Genitourinary Comments: White, creamy vaginal discharge.  No  CMT, no adnexal masses or tenderness  Neurological: She is alert and oriented to person, place, and time.  Skin: Skin is warm. No rash noted.  Psychiatric: She has a normal mood and affect.  Nursing note and vitals reviewed.    ED Treatments / Results  Labs (all labs ordered are listed, but only abnormal results are displayed) Labs Reviewed  WET PREP, GENITAL - Abnormal; Notable for the following:       Result Value   Clue Cells Wet Prep HPF POC PRESENT (*)    WBC, Wet Prep HPF POC MANY (*)    All other components within normal limits  URINALYSIS, ROUTINE W REFLEX MICROSCOPIC (NOT AT [facility])  POC URINE PREG, ED  GC/CHLAMYDIA PROBE AMP ([place]) NOT AT [facility]    EKG  EKG Interpretation None       Radiology No results found.  Procedures Procedures (including critical care time)  Medications Ordered in ED Medications - No data to display   Initial Impression / Assessment and Plan / ED Course  I have reviewed the triage vital signs and the nursing notes.  Pertinent labs & imaging results that were available during my care of the patient were reviewed by me and considered in my medical decision making (see chart for details).  Clinical Course    Pt well appearing.  Ambulates with steady gait.  abd remain soft, NT.  Will tx for BV. Pt requests vaginal application instead of pills. Pt agrees to GYN f/u and understands that she will be contacted if tests positive.    Final Clinical Impressions(s) / ED Diagnoses   Final diagnoses:  BV (bacterial vaginosis)    New Prescriptions Discharge Medication List as of 05/10/2016 10:50 PM    START taking these medications   Details  metroNIDAZOLE (METROGEL) 0.75 % gel 1 applicator vaginally at bedtime for 5 days, Print         Pauline Ausammy Trenita Hulme, PA-C 05/13/16 2256    Marily MemosJason Mesner, MD 05/13/16 [id number]

## 2016-06-09 ENCOUNTER — Emergency Department (HOSPITAL_COMMUNITY)
Admission: EM | Admit: 2016-06-09 | Discharge: 2016-06-09 | Disposition: A | Discharge status: Home / Self Care | Payer: Medicaid Other | Attending: Emergency Medicine | Admitting: Emergency Medicine

## 2016-06-09 ENCOUNTER — Encounter (HOSPITAL_COMMUNITY): Payer: Self-pay | Admitting: Emergency Medicine

## 2016-06-09 DIAGNOSIS — N6324 Unspecified lump in the left breast, lower inner quadrant: Secondary | ICD-10-CM | POA: Insufficient documentation

## 2016-06-09 DIAGNOSIS — F17200 Nicotine dependence, unspecified, uncomplicated: Secondary | ICD-10-CM | POA: Diagnosis not present

## 2016-06-09 DIAGNOSIS — N644 Mastodynia: Secondary | ICD-10-CM | POA: Diagnosis present

## 2016-06-09 DIAGNOSIS — N6320 Unspecified lump in the left breast, unspecified quadrant: Secondary | ICD-10-CM

## 2016-06-09 NOTE — ED Triage Notes (Signed)
Pt reports "knot" under her L breast. Pt states the area is tender but not reddened.

## 2016-06-09 NOTE — ED Provider Notes (Signed)
[facility]-EM[facility] DEPT Provider Note   CSN: [id number] Arrival date & time: 06/09/16  1109   By signing my name below, I, Valentino SaxonBianca Contreras, attest that this documentation has been prepared under the direction and in the presence of Donnetta HutchingBrian Dealva Lafoy, MD. Electronically Signed: Valentino SaxonBianca Contreras, ED Scribe. 06/09/16. 12:02 PM.  History   Chief Complaint Chief Complaint  Patient presents with  . Breast Pain   The history is provided by the patient. No language interpreter was used.     HPI Comments: Robyn Willis is a 25 y.o. female who presents to the Emergency Department complaining of constant, moderate, left breast pain with accompanied soreness onset a week ago. Pt reports she feels as if there is a lump in her left breast with accompanied soreness. Pt notes her left breast soreness wakes her up in the middle of the night. She states the lump in her left breast decreases in size when her menstrual cycle ends. She reports her LMP began at the end of October and ran into the beginning of November. She denies redness to the affected area. Pt also denies fever. Pt reports no modifying factors noted. No additional complaints at this time.   Past Medical History:  Diagnosis Date  . Pregnant 01/02/2016    Patient Active Problem List   Diagnosis Date Noted  . Pregnant 01/02/2016    History reviewed. No pertinent surgical history.  OB History    Gravida Para Term Preterm AB Living   2 1       1    SAB TAB Ectopic Multiple Live Births                   Home Medications    Prior to Admission medications   Not on File    Family History Family History  Problem Relation Age of Onset  . Kidney disease Mother   . Diabetes Mother   . Hypertension Mother   . Diabetes Father   . Hypertension Father   . Heart disease Maternal Grandmother   . Alzheimer's disease Paternal Grandmother     Social History Social History  Substance Use Topics  . Smoking status: Current Some Day Smoker    . Smokeless tobacco: Never Used  . Alcohol use No     Allergies   Penicillins   Review of Systems Review of Systems  Constitutional: Negative for fever.  Musculoskeletal: Positive for myalgias (left breast).  All other systems reviewed and are negative.    Physical Exam Updated Vital Signs BP 132/96 (BP Location: Left Arm)   Pulse 75   Temp 98 F (36.7 C) (Oral)   Resp 18   Ht 5\' 3"  (1.6 m)   Wt 115 lb (52.2 kg)   LMP 05/20/2016   SpO2 100%   Breastfeeding? No   BMI 20.37 kg/m   Physical Exam  Constitutional: She is oriented to person, place, and time. She appears well-developed and well-nourished.  HENT:  Head: Normocephalic and atraumatic.  Eyes: Conjunctivae are normal.  Neck: Neck supple.  Cardiovascular: Normal rate and regular rhythm.   Pulmonary/Chest: Effort normal and breath sounds normal.  Abdominal: Soft. Bowel sounds are normal.  Musculoskeletal: Normal range of motion.  8-9 o'clock on left breast, 1 cm palpable tender tissue.   Neurological: She is alert and oriented to person, place, and time.  Skin: Skin is warm and dry.  Psychiatric: She has a normal mood and affect. Her behavior is normal.  Nursing note and vitals reviewed.  ED Treatments / Results   DIAGNOSTIC STUDIES: Oxygen Saturation is 100% on RA, normal by my interpretation.    COORDINATION OF CARE: 11:46 AM Discussed treatment plan with pt at bedside and pt agreed to plan.   Labs (all labs ordered are listed, but only abnormal results are displayed) Labs Reviewed - No data to display  EKG  EKG Interpretation None       Radiology No results found.  Procedures Procedures (including critical care time)  Medications Ordered in ED Medications - No data to display   Initial Impression / Assessment and Plan / ED Course  I have reviewed the triage vital signs and the nursing notes.  Pertinent labs & imaging results that were available during my care of the patient  were reviewed by me and considered in my medical decision making (see chart for details).  Pt is advised to take tylenol and ibuprofen for pain. Pt is to f/u for an ultrasound. Discussed possibility of fibrocystic disease with pt.  Clinical Course     1 x 1 cm mass in left breast noted. Advised patient that we would order an ultrasound to rule out cancer or any other pathology. Patient was very aware of my instructions. She decided to leave AGAINST MEDICAL ADVICE. RN attempted to contact patient  Final Clinical Impressions(s) / ED Diagnoses   Final diagnoses:  Left breast mass    New Prescriptions There are no discharge medications for this patient.   I personally performed the services described in this documentation, which was scribed in my presence. The recorded information has been reviewed and is accurate.      Donnetta HutchingBrian Ryott Rafferty, MD 06/10/16 [phone]

## 2016-06-09 NOTE — ED Notes (Signed)
Went into room to explain delay and pt found not to be in room.

## 2016-06-09 NOTE — Discharge Instructions (Signed)
You will need an ultrasound of your left breast.  Registered nurse will give you time to return to [facility]

## 2016-07-22 NOTE — L&D Delivery Note (Signed)
Delivery Note At 8:23 AM a viable female was delivered via  (Presentation: LOA, En Caul);  ).  APGAR: 9/9 , ; weight pending  After 1 minute, the cord was clamped and cut. 40 units of pitocin diluted in 1000cc LR was infused rapidly IV.  The placenta separated spontaneously and delivered via CCT and maternal pushing effort.  It was inspected and appears to be intact with a 3 VC.     Anesthesia:  epidural Episiotomy:  none Lacerations:  none Suture Repair: n/a Est. Blood Loss (mL):  300  Mom to postpartum.  Baby to Couplet care / Skin to Skin. \ The above was performed by Dr. Ephriam Knuckleshristian under my direct supervision and guidance.    Willis,Robyn Guyton 02/14/2017, 8:25 AM

## 2016-07-26 ENCOUNTER — Emergency Department (HOSPITAL_COMMUNITY)
Admission: EM | Admit: 2016-07-26 | Discharge: 2016-07-27 | Disposition: A | Discharge status: Home / Self Care | Payer: Medicaid Other | Attending: Emergency Medicine | Admitting: Emergency Medicine

## 2016-07-26 ENCOUNTER — Encounter (HOSPITAL_COMMUNITY): Payer: Self-pay

## 2016-07-26 DIAGNOSIS — Y929 Unspecified place or not applicable: Secondary | ICD-10-CM | POA: Insufficient documentation

## 2016-07-26 DIAGNOSIS — O26891 Other specified pregnancy related conditions, first trimester: Secondary | ICD-10-CM | POA: Diagnosis not present

## 2016-07-26 DIAGNOSIS — W57XXXA Bitten or stung by nonvenomous insect and other nonvenomous arthropods, initial encounter: Secondary | ICD-10-CM | POA: Insufficient documentation

## 2016-07-26 DIAGNOSIS — O99331 Smoking (tobacco) complicating pregnancy, first trimester: Secondary | ICD-10-CM | POA: Insufficient documentation

## 2016-07-26 DIAGNOSIS — F17200 Nicotine dependence, unspecified, uncomplicated: Secondary | ICD-10-CM | POA: Insufficient documentation

## 2016-07-26 DIAGNOSIS — Y939 Activity, unspecified: Secondary | ICD-10-CM | POA: Diagnosis not present

## 2016-07-26 DIAGNOSIS — Y999 Unspecified external cause status: Secondary | ICD-10-CM | POA: Diagnosis not present

## 2016-07-26 DIAGNOSIS — S50862A Insect bite (nonvenomous) of left forearm, initial encounter: Secondary | ICD-10-CM | POA: Insufficient documentation

## 2016-07-26 DIAGNOSIS — S60562A Insect bite (nonvenomous) of left hand, initial encounter: Secondary | ICD-10-CM | POA: Diagnosis not present

## 2016-07-26 MED ORDER — HYDROCORTISONE 1 % EX CREA
TOPICAL_CREAM | Freq: Once | CUTANEOUS | Status: AC
  Administered 2016-07-26: 1 via TOPICAL
  Filled 2016-07-26: qty 1.5

## 2016-07-26 MED ORDER — DIPHENHYDRAMINE HCL 25 MG PO CAPS
25.0000 mg | ORAL_CAPSULE | Freq: Once | ORAL | Status: AC
  Administered 2016-07-26: 25 mg via ORAL
  Filled 2016-07-26: qty 1

## 2016-07-26 MED ORDER — HYDROCORTISONE 1 % EX CREA: TOPICAL_CREAM | CUTANEOUS | 0 refills | Status: DC

## 2016-07-26 NOTE — Discharge Instructions (Signed)
Cool compresses on/off to your wrist.  You can apply a small amount of hydrocortisone cream 2-3 times a day to your wrist.  Return here for any worsening symptoms

## 2016-07-26 NOTE — ED Triage Notes (Signed)
I have a place on my left hand and left arm that was itching and swollen today.  Think it may be a spider bite.

## 2016-07-30 NOTE — ED Provider Notes (Signed)
[facility] DE[facility] Provider Note   CSN: [id number] Arrival date & time: 07/26/16  2211     History   Chief Complaint Chief Complaint  Patient presents with  . Insect Bite    HPI Robyn Willis is a 26 y.o. female.  HPI  Robyn Willis is a 26 y.o. female who is [redacted] weeks pregnant, presents to the Emergency Department complaining of insect bites to her left hand and forearm.  She complains of redness, bumps and itching.  Concerned that she may have been bitten by a spider.  Since she is pregnant, she was unsure what medications she can take.  She denies pain, numbness or other affected areas.  She denies any pregnancy related issues.    Past Medical History:  Diagnosis Date  . Pregnant 01/02/2016    Patient Active Problem List   Diagnosis Date Noted  . Pregnant 01/02/2016    History reviewed. No pertinent surgical history.  OB History    Gravida Para Term Preterm AB Living   3 1       1    SAB TAB Ectopic Multiple Live Births                   Home Medications    Prior to Admission medications   Medication Sig Start Date End Date Taking? Authorizing Provider  hydrocortisone cream 1 % Apply to affected area 3 times daily.  Apply sparingly 07/26/16   Pauline Aus, PA-C    Family History Family History  Problem Relation Age of Onset  . Kidney disease Mother   . Diabetes Mother   . Hypertension Mother   . Diabetes Father   . Hypertension Father   . Heart disease Maternal Grandmother   . Alzheimer's disease Paternal Grandmother     Social History Social History  Substance Use Topics  . Smoking status: Current Some Day Smoker  . Smokeless tobacco: Never Used  . Alcohol use No     Allergies   Penicillins   Review of Systems Review of Systems  Constitutional: Negative for activity change, appetite change, chills and fever.  HENT: Negative for facial swelling, sore throat and trouble swallowing.   Respiratory: Negative for chest tightness,  shortness of breath and wheezing.   Musculoskeletal: Negative for neck pain and neck stiffness.  Skin: Positive for rash. Negative for wound.       Redness, bumps and itching of the left hand and forearm  Neurological: Negative for dizziness, weakness, numbness and headaches.  All other systems reviewed and are negative.    Physical Exam Updated Vital Signs BP 114/79 (BP Location: Right Arm)   Pulse 110   Temp 97.5 F (36.4 C) (Oral)   Resp 18   Ht 5\' 4"  (1.626 m)   Wt 47.6 kg   LMP 05/19/2016   SpO2 100%   BMI 18.02 kg/m   Physical Exam  Constitutional: She is oriented to person, place, and time. She appears well-developed and well-nourished. No distress.  HENT:  Head: Normocephalic and atraumatic.  Mouth/Throat: Oropharynx is clear and moist.  Neck: Normal range of motion. Neck supple.  Cardiovascular: Normal rate, regular rhythm and intact distal pulses.   No murmur heard. Pulmonary/Chest: Effort normal and breath sounds normal. No respiratory distress.  Musculoskeletal: She exhibits no edema or tenderness.  Lymphadenopathy:    She has no cervical adenopathy.  Neurological: She is alert and oriented to person, place, and time. She exhibits normal muscle tone. Coordination normal.  Skin:  Skin is warm. Capillary refill takes less than 2 seconds. Rash noted. There is erythema.  Focal small erythematous papules to the left distal forearm and dorsal left hand.  No vesicles or pustules.  No surrounding erythema.   Nursing note and vitals reviewed.    ED Treatments / Results  Labs (all labs ordered are listed, but only abnormal results are displayed) Labs Reviewed - No data to display  EKG  EKG Interpretation None       Radiology No results found.  Procedures Procedures (including critical care time)  Medications Ordered in ED Medications  diphenhydrAMINE (BENADRYL) capsule 25 mg (25 mg Oral Given 07/26/16 2354)  hydrocortisone cream 1 % (1 application Topical  Given 07/26/16 2354)     Initial Impression / Assessment and Plan / ED Course  I have reviewed the triage vital signs and the nursing notes.  Pertinent labs & imaging results that were available during my care of the patient were reviewed by me and considered in my medical decision making (see chart for details).  Clinical Course     Pregnant pt with likely focal insect bites. NV intact.  Vitals stable. No pregnancy related complaints.  Pt agrees to small amt of hydrocortisone cream, benadryl for itching.  Agrees to OB f/u if needed.    Final Clinical Impressions(s) / ED Diagnoses   Final diagnoses:  Insect bite, initial encounter    New Prescriptions Discharge Medication List as of 07/26/2016 11:49 PM    START taking these medications   Details  hydrocortisone cream 1 % Apply to affected area 3 times daily.  Apply sparingly, Print         Pauline Ausammy Taler Kushner, PA-C 07/30/16 1334    Bethann BerkshireJoseph Zammit, MD 08/02/16 [phone]

## 2016-08-02 ENCOUNTER — Other Ambulatory Visit: Payer: Self-pay | Admitting: Obstetrics and Gynecology

## 2016-08-02 DIAGNOSIS — O3680X0 Pregnancy with inconclusive fetal viability, not applicable or unspecified: Secondary | ICD-10-CM

## 2016-08-05 ENCOUNTER — Ambulatory Visit (INDEPENDENT_AMBULATORY_CARE_PROVIDER_SITE_OTHER): Payer: Medicaid Other

## 2016-08-05 DIAGNOSIS — O3680X0 Pregnancy with inconclusive fetal viability, not applicable or unspecified: Secondary | ICD-10-CM

## 2016-08-05 DIAGNOSIS — Z3A11 11 weeks gestation of pregnancy: Secondary | ICD-10-CM

## 2016-08-05 NOTE — Progress Notes (Addendum)
US 11 wks,single IUP,pos fht 171 bpm,crl 46.6 mm,normal left ovary,right ovary not visualized,EDD 02/24/2017 by LMP

## 2016-08-13 ENCOUNTER — Ambulatory Visit (INDEPENDENT_AMBULATORY_CARE_PROVIDER_SITE_OTHER): Payer: Medicaid Other | Admitting: Women's Health

## 2016-08-13 ENCOUNTER — Encounter: Payer: Self-pay | Admitting: Women's Health

## 2016-08-13 DIAGNOSIS — O99341 Other mental disorders complicating pregnancy, first trimester: Secondary | ICD-10-CM

## 2016-08-13 DIAGNOSIS — Z331 Pregnant state, incidental: Secondary | ICD-10-CM | POA: Diagnosis not present

## 2016-08-13 DIAGNOSIS — Z3A12 12 weeks gestation of pregnancy: Secondary | ICD-10-CM

## 2016-08-13 DIAGNOSIS — Z3682 Encounter for antenatal screening for nuchal translucency: Secondary | ICD-10-CM

## 2016-08-13 DIAGNOSIS — O23591 Infection of other part of genital tract in pregnancy, first trimester: Secondary | ICD-10-CM

## 2016-08-13 DIAGNOSIS — Z1389 Encounter for screening for other disorder: Secondary | ICD-10-CM | POA: Diagnosis not present

## 2016-08-13 DIAGNOSIS — Z3481 Encounter for supervision of other normal pregnancy, first trimester: Secondary | ICD-10-CM

## 2016-08-13 DIAGNOSIS — F32A Depression, unspecified: Secondary | ICD-10-CM

## 2016-08-13 DIAGNOSIS — F329 Major depressive disorder, single episode, unspecified: Secondary | ICD-10-CM

## 2016-08-13 MED ORDER — METRONIDAZOLE 0.75 % VA GEL: 1.0000 | Freq: Every day | VAGINAL | 0 refills | Status: DC

## 2016-08-13 NOTE — Patient Instructions (Signed)

## 2016-08-13 NOTE — Progress Notes (Signed)
  Subjective:  Robyn Willis is a 26 y.o. 873P1011 African American female at 3638w1d by LMP c/w 11wk u/s, being seen today for her first obstetrical visit.  Her obstetrical history is significant for term uncomplicated svb x 1, did have to do multiple u/s for suspected IUGR during pregnancy; SAB x 1.  Does report depression, denies SI/HI, h/o PPD and just continued, declines meds, wants referral for counseling.  Pregnancy history fully reviewed.  [facility]- rx'd metronidazole but makes her sick, requests metrogel, is symptomatic. Denies vb, cramping, uti s/s, abnormal/malodorous vag d/c, or vulvovaginal itching/irritation.  LMP 05/20/2016 (Approximate)   HISTORY: OB History  Gravida Para Term Preterm AB Living  3 1 1   1 1   SAB TAB Ectopic Multiple Live Births          1    # Outcome Date GA Lbr Len/2nd Weight Sex Delivery Anes PTL Lv  3 Current           2 AB 01/18/16 282w5d    SAB     1 Term 02/06/13 8070w4d  6 lb 11 oz (3.033 kg) F Vag-Spont EPI N LIV     Past Medical History:  Diagnosis Date  . Medical history non-contributory   . Pregnant 01/02/2016   Past Surgical History:  Procedure Laterality Date  . NO PAST SURGERIES     Family History  Problem Relation Age of Onset  . Kidney disease Mother   . Diabetes Mother   . Hypertension Mother   . Diabetes Father   . Hypertension Father   . Heart disease Maternal Grandmother   . Alzheimer's disease Paternal Grandmother     Exam   System:     General: Well developed & nourished, no acute distress   Skin: Warm & dry, normal coloration and turgor, no rashes   Neurologic: Alert & oriented, normal mood   Cardiovascular: Regular rate & rhythm   Respiratory: Effort & rate normal, LCTAB, acyanotic   Abdomen: Soft, non tender   Extremities: normal strength, tone  Thin prep pap smear neg 2017 @ [facility]  FHR: 170 via doppler   [facility] 08/13/2016  Decreased Interest 2  Down,  Depressed, Hopeless 3  PHQ - 2 Score 5  Altered sleeping 2  Change in appetite 3  Feeling bad or failure about yourself  3  Trouble concentrating 0  Moving slowly or fidgety/restless 0  Suicidal thoughts 0  PHQ-9 Score 13    Assessment:   Pregnancy: [id number] Patient Active Problem List   Diagnosis Date Noted  . Supervision of normal pregnancy 01/02/2016    7038w1d G3P1011 New OB visit BV recently dx, symptomatic, unable to take metronidazole Depression  Plan:  Initial labs obtained Continue prenatal vitamins Problem list reviewed and updated Reviewed n/v relief measures and warning s/s to report Reviewed recommended weight gain based on pre-gravid BMI Encouraged well-balanced diet Genetic Screening discussed Integrated Screen: requested Cystic fibrosis screening discussed declined Ultrasound discussed; fetal survey: requested Follow up in asap for 1st it/nt (no visit), then 4 weeks for visit and 2nd IT CCNC completed Rx metrogel for symptomatic BV, unable to take metronidazole [facility]erral to [facility], let us know if decides for meds  Marge DuncansBooker, Zyon Grout Randall CNM, [facility]NP[facility]BC 08/13/2016 4:09 PM

## 2016-08-14 LAB — CBC
HEMOGLOBIN: 12.3 g/dL (ref 11.1–15.9)
Hematocrit: 35.1 % (ref 34.0–46.6)
MCH: 30.8 pg (ref 26.6–33.0)
MCHC: 35 g/dL (ref 31.5–35.7)
MCV: 88 fL (ref 79–97)
PLATELETS: 295 10*3/uL (ref 150–379)
RBC: 4 x10E6/uL (ref 3.77–5.28)
RDW: 14.5 % (ref 12.3–15.4)
WBC: 11.8 10*3/uL — AB (ref 3.4–10.8)

## 2016-08-14 LAB — URINALYSIS, ROUTINE W REFLEX MICROSCOPIC
BILIRUBIN UA: NEGATIVE
Glucose, UA: NEGATIVE
Ketones, UA: NEGATIVE
Leukocytes, UA: NEGATIVE
Nitrite, UA: NEGATIVE
PH UA: 6 (ref 5.0–7.5)
Protein, UA: NEGATIVE
RBC UA: NEGATIVE
Specific Gravity, UA: 1.029 (ref 1.005–1.030)
UUROB: 1 mg/dL (ref 0.2–1.0)

## 2016-08-14 LAB — PMP SCREEN PROFILE (10S), URINE
AMPHETAMINE SCRN UR: NEGATIVE ng/mL
BENZODIAZEPINE SCREEN, URINE: NEGATIVE ng/mL
Barbiturate Screen, Ur: NEGATIVE ng/mL
CANNABINOIDS UR QL SCN: POSITIVE ng/mL
COCAINE(METAB.) SCREEN, URINE: NEGATIVE ng/mL
Creatinine(Crt), U: 199 mg/dL (ref 20.0–300.0)
Methadone Scn, Ur: NEGATIVE ng/mL
Opiate Scrn, Ur: NEGATIVE ng/mL
Oxycodone+Oxymorphone Ur Ql Scn: NEGATIVE ng/mL
PCP SCRN UR: NEGATIVE ng/mL
PH UR, DRUG SCRN: 6 (ref 4.5–8.9)
Propoxyphene, Screen: NEGATIVE ng/mL

## 2016-08-14 LAB — SICKLE CELL SCREEN: SICKLE CELL SCREEN: NEGATIVE

## 2016-08-14 LAB — VARICELLA ZOSTER ANTIBODY, IGG: VARICELLA: 2423 {index} (ref >165)

## 2016-08-14 LAB — ANTIBODY SCREEN: ANTIBODY SCREEN: NEGATIVE

## 2016-08-14 LAB — RUBELLA SCREEN: RUBELLA: 12.2 {index} (ref >0.99)

## 2016-08-14 LAB — HEPATITIS B SURFACE ANTIGEN: Hepatitis B Surface Ag: NEGATIVE

## 2016-08-14 LAB — ABO/RH: RH TYPE: POSITIVE

## 2016-08-14 LAB — HIV ANTIBODY (ROUTINE TESTING W REFLEX): HIV Screen 4th Generation wRfx: NONREACTIVE

## 2016-08-14 LAB — RPR: RPR Ser Ql: NONREACTIVE

## 2016-08-15 ENCOUNTER — Encounter: Payer: Self-pay | Admitting: Women's Health

## 2016-08-15 ENCOUNTER — Telehealth: Payer: Self-pay | Admitting: Obstetrics and Gynecology

## 2016-08-15 DIAGNOSIS — F1290 Cannabis use, unspecified, uncomplicated: Secondary | ICD-10-CM | POA: Insufficient documentation

## 2016-08-15 NOTE — Telephone Encounter (Signed)
Patient called with complaints of light spotting with "chunks of pink tisssue" with moderate cramping. She is scheduled for NT/IT tomorrow. Spoke with Grace BushyBooker, CNM who stated if bleeding becomes heavy like a period with more intense cramping, [facility] would be able to see tomorrow.  Pt verbalized understanding.

## 2016-08-16 ENCOUNTER — Ambulatory Visit (INDEPENDENT_AMBULATORY_CARE_PROVIDER_SITE_OTHER): Payer: Medicaid Other

## 2016-08-16 ENCOUNTER — Ambulatory Visit (INDEPENDENT_AMBULATORY_CARE_PROVIDER_SITE_OTHER): Payer: Medicaid Other | Admitting: Women's Health

## 2016-08-16 ENCOUNTER — Other Ambulatory Visit: Payer: Medicaid Other

## 2016-08-16 DIAGNOSIS — Z3682 Encounter for antenatal screening for nuchal translucency: Secondary | ICD-10-CM | POA: Diagnosis not present

## 2016-08-16 DIAGNOSIS — O468X1 Other antepartum hemorrhage, first trimester: Secondary | ICD-10-CM

## 2016-08-16 DIAGNOSIS — Z3A13 13 weeks gestation of pregnancy: Secondary | ICD-10-CM

## 2016-08-16 DIAGNOSIS — O283 Abnormal ultrasonic finding on antenatal screening of mother: Secondary | ICD-10-CM | POA: Diagnosis not present

## 2016-08-16 NOTE — Progress Notes (Signed)
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

## 2016-08-16 NOTE — Progress Notes (Signed)
Work-in Low-risk OB appointment [id number] 4478w4d Estimated Date of Delivery: 02/24/17 LMP 05/20/2016 (Approximate)   No fm yet. Denies cramping, lof, or uti s/s. No complaints. Was here for 1st IT/NT, per amber has 4cm retroplacental hemorrhage, pt states she has some light pink spotting Wed, none since. Rh+. Discussed can increase r/f SAB.  Reviewed warning s/s to report, reasons to seek care. Plan:  Continue routine obstetrical care  F/U as scheduled for OB appointment

## 2016-08-20 ENCOUNTER — Telehealth: Payer: Self-pay | Admitting: Women's Health

## 2016-08-20 NOTE — Telephone Encounter (Signed)
Pt states she is having increased cramping since last visit here, Tylenol is not helping at all, pt denies increase in bleeding.  Pt states she does not want to deal with this pain for the whole pregnancy and wanted to know what her options for terminating the pregnancy were.  Informed pt we do not do terminations at this office.  Pt verbalized understanding.

## 2016-08-22 ENCOUNTER — Telehealth: Payer: Self-pay | Admitting: Women's Health

## 2016-08-22 NOTE — Telephone Encounter (Signed)
Voice mail not set up @ 1:11 pm. JSY

## 2016-08-23 ENCOUNTER — Telehealth: Payer: Self-pay | Admitting: Obstetrics & Gynecology

## 2016-08-23 LAB — MATERNAL SCREEN, INTEGRATED #1
Crown Rump Length: 65.2 mm
Gest. Age on Collection Date: 12.7 weeks
MATERNAL AGE AT EDD: 23.8 a
NUCHAL TRANSLUCENCY (NT): 1.6 mm
NUMBER OF FETUSES: 1
PAPP-A Value: 1276.2 ng/mL
Weight: 108 [lb_av]

## 2016-08-23 NOTE — Telephone Encounter (Signed)
Spoke with patient. States she still has a rash that is not getting any better. She has tried the hydrocortisone cream with some relief. I advised her to try Benadryl, Calamine lotion or Aveeno products. Pt states she has anxiety which may be contributing to the rash. Encouraged patient to try these suggestions to see if she could get any relief. Pt verbalized understanding.

## 2016-08-26 ENCOUNTER — Telehealth: Payer: Self-pay | Admitting: Women's Health

## 2016-08-26 MED ORDER — CITRANATAL ASSURE 35-1 & 300 MG PO MISC: ORAL | 11 refills | Status: DC

## 2016-08-26 NOTE — Telephone Encounter (Signed)
Pt informed PNV was sent to pharmacy.

## 2016-08-26 NOTE — Telephone Encounter (Signed)
Spoke with pt. Pt has a rash on arms, back, feet, and hands. + itching. Hydrocortisone cream is helping. I advised to let us know if rash got bad again. Pt voiced understanding. Also, pt wants prenatals sent to pharmacy. Thanks!! JSY

## 2016-08-30 ENCOUNTER — Telehealth: Payer: Self-pay | Admitting: Women's Health

## 2016-08-30 NOTE — Telephone Encounter (Signed)
Pt was calling for results of NT/ITm explained to pt she will not get results of that test until after the 2 nd round of blood work witch will be done at her next OV.  Pt verbalized understanding.

## 2016-08-30 NOTE — Telephone Encounter (Signed)
Pt called stating that she would like to know the results of her test, pt states that she had a test results to see if her baby has down syndrome. Please contact pt

## 2016-09-10 ENCOUNTER — Encounter: Payer: Self-pay | Admitting: Women's Health

## 2016-09-10 ENCOUNTER — Ambulatory Visit (INDEPENDENT_AMBULATORY_CARE_PROVIDER_SITE_OTHER): Payer: Medicaid Other | Admitting: Women's Health

## 2016-09-10 VITALS — BP 102/66 | HR 79 | Wt 112.0 lb

## 2016-09-10 DIAGNOSIS — Z331 Pregnant state, incidental: Secondary | ICD-10-CM | POA: Diagnosis not present

## 2016-09-10 DIAGNOSIS — Z1389 Encounter for screening for other disorder: Secondary | ICD-10-CM | POA: Diagnosis not present

## 2016-09-10 DIAGNOSIS — L509 Urticaria, unspecified: Secondary | ICD-10-CM | POA: Diagnosis not present

## 2016-09-10 DIAGNOSIS — Z3A16 16 weeks gestation of pregnancy: Secondary | ICD-10-CM

## 2016-09-10 DIAGNOSIS — Z3682 Encounter for antenatal screening for nuchal translucency: Secondary | ICD-10-CM

## 2016-09-10 DIAGNOSIS — Z363 Encounter for antenatal screening for malformations: Secondary | ICD-10-CM

## 2016-09-10 DIAGNOSIS — Z3482 Encounter for supervision of other normal pregnancy, second trimester: Secondary | ICD-10-CM

## 2016-09-10 LAB — POCT URINALYSIS DIPSTICK
Blood, UA: NEGATIVE
Glucose, UA: NEGATIVE
Ketones, UA: NEGATIVE
LEUKOCYTES UA: NEGATIVE
Nitrite, UA: NEGATIVE

## 2016-09-10 MED ORDER — RANITIDINE HCL 150 MG PO TABS: 150.0000 mg | ORAL_TABLET | Freq: Two times a day (BID) | ORAL | 6 refills | Status: DC

## 2016-09-10 NOTE — Patient Instructions (Addendum)
Tips for Helping Acne:  Wash face with warm (not hot) water twice daily with a gentle cleanser such as Cetaphil or Dove Sensitive Skin bar  Do not scrub face or pick at bumps/lesions  Use water-based (not oil-based) lotions, make-up, and hair products   Second Trimester of Pregnancy The second trimester is from week 13 through week 28 (months 4 through 6). The second trimester is often a time when you feel your best. Your body has also adjusted to being pregnant, and you begin to feel better physically. Usually, morning sickness has lessened or quit completely, you may have more energy, and you may have an increase in appetite. The second trimester is also a time when the fetus is growing rapidly. At the end of the sixth month, the fetus is about 9 inches long and weighs about 1 pounds. You will likely begin to feel the baby move (quickening) between 18 and 20 weeks of the pregnancy. Body changes during your second trimester Your body continues to go through many changes during your second trimester. The changes vary from woman to woman.  Your weight will continue to increase. You will notice your lower abdomen bulging out.  You may begin to get stretch marks on your hips, abdomen, and breasts.  You may develop headaches that can be relieved by medicines. The medicines should be approved by your health care provider.  You may urinate more often because the fetus is pressing on your bladder.  You may develop or continue to have heartburn as a result of your pregnancy.  You may develop constipation because certain hormones are causing the muscles that push waste through your intestines to slow down.  You may develop hemorrhoids or swollen, bulging veins (varicose veins).  You may have back pain. This is caused by:  Weight gain.  Pregnancy hormones that are relaxing the joints in your pelvis.  A shift in weight and the muscles that support your balance.  Your breasts will continue to  grow and they will continue to become tender.  Your gums may bleed and may be sensitive to brushing and flossing.  Dark spots or blotches (chloasma, mask of pregnancy) may develop on your face. This will likely fade after the baby is born.  A dark line from your belly button to the pubic area (linea nigra) may appear. This will likely fade after the baby is born.  You may have changes in your hair. These can include thickening of your hair, rapid growth, and changes in texture. Some women also have hair loss during or after pregnancy, or hair that feels dry or thin. Your hair will most likely return to normal after your baby is born. What to expect at prenatal visits During a routine prenatal visit:  You will be weighed to make sure you and the fetus are growing normally.  Your blood pressure will be taken.  Your abdomen will be measured to track your baby's growth.  The fetal heartbeat will be listened to.  Any test results from the previous visit will be discussed. Your health care provider may ask you:  How you are feeling.  If you are feeling the baby move.  If you have had any abnormal symptoms, such as leaking fluid, bleeding, severe headaches, or abdominal cramping.  If you are using any tobacco products, including cigarettes, chewing tobacco, and electronic cigarettes.  If you have any questions. Other tests that may be performed during your second trimester include:  Blood tests that check  for:  Low iron levels (anemia).  Gestational diabetes (between 24 and 28 weeks).  Rh antibodies. This is to check for a protein on red blood cells (Rh factor).  Urine tests to check for infections, diabetes, or protein in the urine.  An ultrasound to confirm the proper growth and development of the baby.  An amniocentesis to check for possible genetic problems.  Fetal screens for spina bifida and Down syndrome.  HIV (human immunodeficiency virus) testing. Routine prenatal  testing includes screening for HIV, unless you choose not to have this test. Follow these instructions at home: Eating and drinking  Continue to eat regular, healthy meals.  Avoid raw meat, uncooked cheese, cat litter boxes, and soil used by cats. These carry germs that can cause birth defects in the baby.  Take your prenatal vitamins.  Take 1500-2000 mg of calcium daily starting at the 20th week of pregnancy until you deliver your baby.  If you develop constipation:  Take over-the-counter or prescription medicines.  Drink enough fluid to keep your urine clear or pale yellow.  Eat foods that are high in fiber, such as fresh fruits and vegetables, whole grains, and beans.  Limit foods that are high in fat and processed sugars, such as fried and sweet foods. Activity  Exercise only as directed by your health care provider. Experiencing uterine cramps is a good sign to stop exercising.  Avoid heavy lifting, wear low heel shoes, and practice good posture.  Wear your seat belt at all times when driving.  Rest with your legs elevated if you have leg cramps or low back pain.  Wear a good support bra for breast tenderness.  Do not use hot tubs, steam rooms, or saunas. Lifestyle  Avoid all smoking, herbs, alcohol, and unprescribed drugs. These chemicals affect the formation and growth of the baby.  Do not use any products that contain nicotine or tobacco, such as cigarettes and e-cigarettes. If you need help quitting, ask your health care provider.  A sexual relationship may be continued unless your health care provider directs you otherwise. General instructions  Follow your health care provider's instructions regarding medicine use. There are medicines that are either safe or unsafe to take during pregnancy.  Take warm sitz baths to soothe any pain or discomfort caused by hemorrhoids. Use hemorrhoid cream if your health care provider approves.  If you develop varicose veins,  wear support hose. Elevate your feet for 15 minutes, 3-4 times a day. Limit salt in your diet.  Visit your dentist if you have not gone yet during your pregnancy. Use a soft toothbrush to brush your teeth and be gentle when you floss.  Keep all follow-up prenatal visits as told by your health care provider. This is important. Contact a health care provider if:  You have dizziness.  You have mild pelvic cramps, pelvic pressure, or nagging pain in the abdominal area.  You have persistent nausea, vomiting, or diarrhea.  You have a bad smelling vaginal discharge.  You have pain with urination. Get help right away if:  You have a fever.  You are leaking fluid from your vagina.  You have spotting or bleeding from your vagina.  You have severe abdominal cramping or pain.  You have rapid weight gain or weight loss.  You have shortness of breath with chest pain.  You notice sudden or extreme swelling of your face, hands, ankles, feet, or legs.  You have not felt your baby move in over an hour.  You have severe headaches that do not go away with medicine.  You have vision changes. Summary  The second trimester is from week 13 through week 28 (months 4 through 6). It is also a time when the fetus is growing rapidly.  Your body goes through many changes during pregnancy. The changes vary from woman to woman.  Avoid all smoking, herbs, alcohol, and unprescribed drugs. These chemicals affect the formation and growth your baby.  Do not use any tobacco products, such as cigarettes, chewing tobacco, and e-cigarettes. If you need help quitting, ask your health care provider.  Contact your health care provider if you have any questions. Keep all prenatal visits as told by your health care provider. This is important. This information is not intended to replace advice given to you by your health care provider. Make sure you discuss any questions you have with your health care  provider. Document Released: 07/02/2001 Document Revised: 12/14/2015 Document Reviewed: 09/08/2012 Elsevier Interactive Patient Education  2017 [employer]   Hives Hives (urticaria) are itchy, red, swollen areas on your skin. Hives can appear on any part of your body and can vary in size. They can be as small as the tip of a pen or much larger. Hives often fade within 24 hours (acute hives). In other cases, new hives appear after old ones fade. This cycle can continue for several days or weeks (chronic hives). Hives result from your body's reaction to an irritant or to something that you are allergic to (trigger). When you are exposed to a trigger, your body releases a chemical (histamine) that causes redness, itching, and swelling. You can get hives immediately after being exposed to a trigger or hours later. Hives do not spread from person to person (are not contagious). Your hives may get worse with scratching, exercise, and emotional stress. What are the causes? Causes of this condition include:  Allergies to certain foods or ingredients.  Insect bites or stings.  Exposure to pollen or pet dander.  Contact with latex or chemicals.  Spending time in sunlight, heat, or cold (exposure).  Exercise.  Stress. You can also get hives from some medical conditions and treatments. These include:  Viruses, including the common cold.  Bacterial infections, such as urinary tract infections and strep throat.  Disorders such as vasculitis, lupus, or thyroid disease.  Certain medications.  Allergy shots.  Blood transfusions. Sometimes, the cause of hives is not known (idiopathic hives). What increases the risk? This condition is more likely to develop in:  Women.  People who have food allergies, especially to citrus fruits, milk, eggs, peanuts, tree nuts, or shellfish.  People who are allergic to:  Medicines.  Latex.  Insects.  Animals.  Pollen.  People who have  certain medical conditions, includinglupus or thyroid disease. What are the signs or symptoms? The main symptom of this condition is raised, itchyred or white bumps or patches on your skin. These areas may:  Become large and swollen (welts).  Change in shape and location, quickly and repeatedly.  Be separate hives or connect over a large area of skin.  Sting or become painful.  Turn white when pressed in the center (blanch). In severe cases, yourhands, feet, and face may also become swollen. This may occur if hives develop deeper in your skin. How is this diagnosed? This condition is diagnosed based on your symptoms, medical history, and physical exam. Your skin, urine, or blood may be tested to find out what is causing your  hives and to rule out other health issues. Your health care provider may also remove a small sample of skin from the affected area and examine it under a microscope (biopsy). How is this treated? Treatment depends on the severity of your condition. Your health care provider may recommend using cool, wet cloths (cool compresses) or taking cool showers to relieve itching. Hives are sometimes treated with medicines, including:  Antihistamines.  Corticosteroids.  Antibiotics.  An injectable medicine (omalizumab). Your health care provider may prescribe this if you have chronic idiopathic hives and you continue to have symptoms even after treatment with antihistamines. Severe cases may require an emergency injection of adrenaline (epinephrine) to prevent a life-threatening allergic reaction (anaphylaxis). Follow these instructions at home: Medicines  Take or apply over-the-counter and prescription medicines only as told by your health care provider.  If you were prescribed an antibiotic medicine, use it as told by your health care provider. Do not stop taking the antibiotic even if you start to feel better. Skin Care  Apply cool compresses to the affected  areas.  Do not scratch or rub your skin. General instructions  Do not take hot showers or baths. This can make itching worse.  Do not wear tight-fitting clothing.  Use sunscreen and wear protective clothing when you are outside.  Avoid any substances that cause your hives. Keep a journal to help you track what causes your hives. Write down:  What medicines you take.  What you eat and drink.  What products you use on your skin.  Keep all follow-up visits as told by your health care provider. This is important. Contact a health care provider if:  Your symptoms are not controlled with medicine.  Your joints are painful or swollen. Get help right away if:  You have a fever.  You have pain in your abdomen.  Your tongue or lips are swollen.  Your eyelids are swollen.  Your chest or throat feels tight.  You have trouble breathing or swallowing. These symptoms may represent a serious problem that is an emergency. Do not wait to see if the symptoms will go away. Get medical help right away. Call your local emergency services (911 in the [place] Do not drive yourself to the hospital.  This information is not intended to replace advice given to you by your health care provider. Make sure you discuss any questions you have with your health care provider. Document Released: 07/08/2005 Document Revised: 12/06/2015 Document Reviewed: 04/26/2015 Elsevier Interactive Patient Education  2017 [employer].

## 2016-09-10 NOTE — Progress Notes (Signed)
Low-risk OB appointment G3P1011 6031w1d Estimated Date of Delivery: 02/24/17 BP 102/66   Pulse 79   Wt 112 lb (50.8 kg)   LMP 05/20/2016 (Approximate)   BMI 19.22 kg/m   BP, weight, and urine reviewed.  Refer to obstetrical flow sheet for FH & FHR.  No fm yet. Denies cramping, lof, vb, or uti s/s. Itchy red spots that pop up all over body. Has 2 active spots on Rt upper arm, ~2cm slightly raised erythematous spots- appear to be hives. Co-exam w/ LHE, he agrees, recommends zantac 150mg  BID and benadryl po prn, can continue hydrocortisone cream, avoid hot showers/baths, cool shower/washcloths can help   Reviewed warning s/s to report. Plan:  Continue routine obstetrical care  F/U in 4wks for OB appointment and anatomy u/s 2nd IT today

## 2016-09-17 LAB — MATERNAL SCREEN, INTEGRATED #2
ADSF: 1.03
AFP MOM: 1.16
Alpha-Fetoprotein: 49.2 ng/mL
Crown Rump Length: 65.2 mm
DIA MoM: 1.38
DIA Value: 300.4 pg/mL
ESTRIOL UNCONJUGATED: 0.95 ng/mL
GEST. AGE ON COLLECTION DATE: 12.7 wk
Gestational Age: 16.3 weeks
HCG VALUE: 34.2 [IU]/mL
MATERNAL AGE AT EDD: 23.8 a
NUMBER OF FETUSES: 1
Nuchal Translucency (NT): 1.6 mm
Nuchal Translucency MoM: 0.98
PAPP-A MoM: 0.85
PAPP-A Value: 1276.2 ng/mL
Test Results:: NEGATIVE
WEIGHT: 108 [lb_av]
WEIGHT: 108 [lb_av]
hCG MoM: 0.89

## 2016-10-08 ENCOUNTER — Ambulatory Visit (INDEPENDENT_AMBULATORY_CARE_PROVIDER_SITE_OTHER): Payer: Medicaid Other | Admitting: Women's Health

## 2016-10-08 ENCOUNTER — Other Ambulatory Visit: Payer: Self-pay | Admitting: Women's Health

## 2016-10-08 ENCOUNTER — Ambulatory Visit (INDEPENDENT_AMBULATORY_CARE_PROVIDER_SITE_OTHER): Payer: Medicaid Other

## 2016-10-08 ENCOUNTER — Encounter: Payer: Self-pay | Admitting: Women's Health

## 2016-10-08 VITALS — BP 98/60 | HR 80 | Wt 117.0 lb

## 2016-10-08 DIAGNOSIS — O26872 Cervical shortening, second trimester: Secondary | ICD-10-CM

## 2016-10-08 DIAGNOSIS — Z331 Pregnant state, incidental: Secondary | ICD-10-CM

## 2016-10-08 DIAGNOSIS — O350XX0 Maternal care for (suspected) central nervous system malformation in fetus, not applicable or unspecified: Secondary | ICD-10-CM

## 2016-10-08 DIAGNOSIS — O0990 Supervision of high risk pregnancy, unspecified, unspecified trimester: Secondary | ICD-10-CM | POA: Diagnosis not present

## 2016-10-08 DIAGNOSIS — O26879 Cervical shortening, unspecified trimester: Secondary | ICD-10-CM

## 2016-10-08 DIAGNOSIS — Z1389 Encounter for screening for other disorder: Secondary | ICD-10-CM | POA: Diagnosis not present

## 2016-10-08 DIAGNOSIS — Z363 Encounter for antenatal screening for malformations: Secondary | ICD-10-CM

## 2016-10-08 DIAGNOSIS — IMO0001 Reserved for inherently not codable concepts without codable children: Secondary | ICD-10-CM

## 2016-10-08 LAB — POCT URINALYSIS DIPSTICK
Blood, UA: NEGATIVE
GLUCOSE UA: NEGATIVE
LEUKOCYTES UA: NEGATIVE
NITRITE UA: NEGATIVE
Protein, UA: NEGATIVE

## 2016-10-08 MED ORDER — PROGESTERONE MICRONIZED 200 MG PO CAPS: 200.0000 mg | ORAL_CAPSULE | Freq: Every day | ORAL | 5 refills | Status: DC

## 2016-10-08 NOTE — Progress Notes (Signed)
High Risk Pregnancy Diagnosis(es): short cervix dx today G3P1011 9626w1d Estimated Date of Delivery: 02/24/17 BP 98/60   Pulse 80   Wt 117 lb (53.1 kg)   LMP 05/20/2016 (Approximate)   BMI 20.08 kg/m   Urinalysis: Positive for sm ketones HPI:  Doing well, no complaints, some occ cramping BP, weight, and urine reviewed.  Reports good fm. Denies regular uc's, lof, vb, uti s/s.   Fundal Height:  20 Fetal Heart rate:  158 u/s Edema:  None Today's anatomy u/s: Rt CP cyst w/ otherwise normal anatomy, had normal nt/it. 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 Gave printed info on cp cyst- will recheck w/ future u/s. Pelvic rest for now d/t short cx Note given to decrease shifts to 4hr/day Assessment: 2226w1d short cx Medication(s) Plans:  rx prometrium pv qhs Treatment Plan:  CL q2wks Follow up in 2wks for high-risk OB appt and CL u/s

## 2016-10-08 NOTE — Progress Notes (Signed)
US 20+1 wks,cephalic,right choroid plexus cyst 8 x 5 x 5 mm ,normal ovaries,post pl gr 0,dynamic cervix 1.8 cm w/pressure,2.6 cm w/o pressure,svp of fluid 4.5 cm,fhr 158 bpm,EFW 318 g,anatomy complete

## 2016-10-08 NOTE — Patient Instructions (Addendum)
No sex or anything in vagina for now  [place] Pediatricians/Family Doctors:  [facility] [phone]            [facility] [phone]                 [facility] [phone] (usually not accepting new patients unless you have family there already, you are always welcome to call and ask)            [facility] ([address]) [phone]   [facility] Pediatricians/Family Doctors:   [facility]: [phone]  [facility]: [phone]    Second Trimester of Pregnancy The second trimester is from week 14 through week 27 (months 4 through 6). The second trimester is often a time when you feel your best. Your body has adjusted to being pregnant, and you begin to feel better physically. Usually, morning sickness has lessened or quit completely, you may have more energy, and you may have an increase in appetite. The second trimester is also a time when the fetus is growing rapidly. At the end of the sixth month, the fetus is about 9 inches long and weighs about 1 pounds. You will likely begin to feel the baby move (quickening) between 16 and 20 weeks of pregnancy. Body changes during your second trimester Your body continues to go through many changes during your second trimester. The changes vary from woman to woman.  Your weight will continue to increase. You will notice your lower abdomen bulging out.  You may begin to get stretch marks on your hips, abdomen, and breasts.  You may develop headaches that can be relieved by medicines. The medicines should be approved by your health care provider.  You may urinate more often because the fetus is pressing on your bladder.  You may develop or continue to have heartburn as a result of your pregnancy.  You may develop constipation because certain hormones are causing the muscles that push waste through your intestines to slow down.  You may  develop hemorrhoids or swollen, bulging veins (varicose veins).  You may have back pain. This is caused by:  Weight gain.  Pregnancy hormones that are relaxing the joints in your pelvis.  A shift in weight and the muscles that support your balance.  Your breasts will continue to grow and they will continue to become tender.  Your gums may bleed and may be sensitive to brushing and flossing.  Dark spots or blotches (chloasma, mask of pregnancy) may develop on your face. This will likely fade after the baby is born.  A dark line from your belly button to the pubic area (linea nigra) may appear. This will likely fade after the baby is born.  You may have changes in your hair. These can include thickening of your hair, rapid growth, and changes in texture. Some women also have hair loss during or after pregnancy, or hair that feels dry or thin. Your hair will most likely return to normal after your baby is born. What to expect at prenatal visits During a routine prenatal visit:  You will be weighed to make sure you and the fetus are growing normally.  Your blood pressure will be taken.  Your abdomen will be measured to track your baby's growth.  The fetal heartbeat will be listened to.  Any test results from the previous visit will be discussed. Your health care provider may ask you:  How you are feeling.  If you  are feeling the baby move.  If you have had any abnormal symptoms, such as leaking fluid, bleeding, severe headaches, or abdominal cramping.  If you are using any tobacco products, including cigarettes, chewing tobacco, and electronic cigarettes.  If you have any questions. Other tests that may be performed during your second trimester include:  Blood tests that check for:  Low iron levels (anemia).  High blood sugar that affects pregnant women (gestational diabetes) between 66 and 28 weeks.  Rh antibodies. This is to check for a protein on red blood cells (Rh  factor).  Urine tests to check for infections, diabetes, or protein in the urine.  An ultrasound to confirm the proper growth and development of the baby.  An amniocentesis to check for possible genetic problems.  Fetal screens for spina bifida and Down syndrome.  HIV (human immunodeficiency virus) testing. Routine prenatal testing includes screening for HIV, unless you choose not to have this test. Follow these instructions at home: Medicines   Follow your health care provider's instructions regarding medicine use. Specific medicines may be either safe or unsafe to take during pregnancy.  Take a prenatal vitamin that contains at least 600 micrograms (mcg) of folic acid.  If you develop constipation, try taking a stool softener if your health care provider approves. Eating and drinking   Eat a balanced diet that includes fresh fruits and vegetables, whole grains, good sources of protein such as meat, eggs, or tofu, and low-fat dairy. Your health care provider will help you determine the amount of weight gain that is right for you.  Avoid raw meat and uncooked cheese. These carry germs that can cause birth defects in the baby.  If you have low calcium intake from food, talk to your health care provider about whether you should take a daily calcium supplement.  Limit foods that are high in fat and processed sugars, such as fried and sweet foods.  To prevent constipation:  Drink enough fluid to keep your urine clear or pale yellow.  Eat foods that are high in fiber, such as fresh fruits and vegetables, whole grains, and beans. Activity   Exercise only as directed by your health care provider. Most women can continue their usual exercise routine during pregnancy. Try to exercise for 30 minutes at least 5 days a week. Stop exercising if you experience uterine contractions.  Avoid heavy lifting, wear low heel shoes, and practice good posture.  A sexual relationship may be continued  unless your health care provider directs you otherwise. Relieving pain and discomfort   Wear a good support bra to prevent discomfort from breast tenderness.  Take warm sitz baths to soothe any pain or discomfort caused by hemorrhoids. Use hemorrhoid cream if your health care provider approves.  Rest with your legs elevated if you have leg cramps or low back pain.  If you develop varicose veins, wear support hose. Elevate your feet for 15 minutes, 3-4 times a day. Limit salt in your diet. Prenatal Care   Write down your questions. Take them to your prenatal visits.  Keep all your prenatal visits as told by your health care provider. This is important. Safety   Wear your seat belt at all times when driving.  Make a list of emergency phone numbers, including numbers for family, friends, the hospital, and police and fire departments. General instructions   Ask your health care provider for a referral to a local prenatal education class. Begin classes no later than the beginning of  month 6 of your pregnancy.  Ask for help if you have counseling or nutritional needs during pregnancy. Your health care provider can offer advice or refer you to specialists for help with various needs.  Do not use hot tubs, steam rooms, or saunas.  Do not douche or use tampons or scented sanitary pads.  Do not cross your legs for long periods of time.  Avoid cat litter boxes and soil used by cats. These carry germs that can cause birth defects in the baby and possibly loss of the fetus by miscarriage or stillbirth.  Avoid all smoking, herbs, alcohol, and unprescribed drugs. Chemicals in these products can affect the formation and growth of the baby.  Do not use any products that contain nicotine or tobacco, such as cigarettes and e-cigarettes. If you need help quitting, ask your health care provider.  Visit your dentist if you have not gone yet during your pregnancy. Use a soft toothbrush to brush your  teeth and be gentle when you floss. Contact a health care provider if:  You have dizziness.  You have mild pelvic cramps, pelvic pressure, or nagging pain in the abdominal area.  You have persistent nausea, vomiting, or diarrhea.  You have a bad smelling vaginal discharge.  You have pain when you urinate. Get help right away if:  You have a fever.  You are leaking fluid from your vagina.  You have spotting or bleeding from your vagina.  You have severe abdominal cramping or pain.  You have rapid weight gain or weight loss.  You have shortness of breath with chest pain.  You notice sudden or extreme swelling of your face, hands, ankles, feet, or legs.  You have not felt your baby move in over an hour.  You have severe headaches that do not go away when you take medicine.  You have vision changes. Summary  The second trimester is from week 14 through week 27 (months 4 through 6). It is also a time when the fetus is growing rapidly.  Your body goes through many changes during pregnancy. The changes vary from woman to woman.  Avoid all smoking, herbs, alcohol, and unprescribed drugs. These chemicals affect the formation and growth your baby.  Do not use any tobacco products, such as cigarettes, chewing tobacco, and e-cigarettes. If you need help quitting, ask your health care provider.  Contact your health care provider if you have any questions. Keep all prenatal visits as told by your health care provider. This is important. This information is not intended to replace advice given to you by your health care provider. Make sure you discuss any questions you have with your health care provider. Document Released: 07/02/2001 Document Revised: 12/14/2015 Document Reviewed: 09/08/2012 Elsevier Interactive Patient Education  2017 Elsevier Inc[employer]   Cervical Insufficiency Cervical insufficiency is when the cervix is weak and starts to open (dilate) and thin (efface) before the  pregnancy is at term and before labor starts. This is also called incompetent cervix. It can happen during the second or third trimester when the fetus starts putting pressure on the cervix. Treatment may reduce the risk of problems for you and your baby. Cervical insufficiency can lead to:  Loss of the baby (miscarriage).  Breaking of the sac that holds the baby (amniotic sac). This is also called preterm premature rupture of the membranes,PPROM.  The baby being born early (preterm birth). What are the causes? The cause of this condition is not well known. However, it may  be caused by abnormalities in the cervix and other factors such as inflammation or infection. What increases the risk? This condition is more likely to develop if:  You have a shorter cervix than normal.  Your cervix was damaged or injured during a past pregnancy or surgery.  You were born with a cervical defect.  You have had a procedure done on the cervix, such as cervical biopsy.  You have a history of:  Cervical insufficiency.  PPROM.  You have ended several pregnancies through abortion.  You were exposed to the drug diethylstilbestrol (DES). What are the signs or symptoms? Symptoms of this condition can vary. Sometimes there no symptoms for this condition, and at other times there are mild symptoms that start between weeks 14 and 20 of pregnancy. The symptoms may last several days or weeks. These symptoms include:  Light spotting or bleeding from the vagina.  Pelvic pressure.  A change in vaginal discharge, such as changes from clear, white, or light yellow to pink or tan.  Back pain.  Abdominal pain or cramping. How is this diagnosed? This condition may be diagnosed based on:  Your symptoms.  Your medical history, including:  Any problems during past pregnancies, such as miscarriages.  Any procedures performed on your cervix.  Any history of cervical insufficiency. During the second  trimester, cervical insufficiency may be diagnosed based on:  An ultrasound done with a probe inserted into your vagina (transvaginal ultrasound).  A pelvic exam.  Tests of fluid in the amniotic sac. This is done to rule out infection. How is this treated? This condition may be managed by:  Limiting physical activity.  Limiting activity at home or in the hospital.  Pelvic rest. This means that there should be no sexual intercourse or placing anything in the vagina.  A procedure to sew the cervix closed and prevent it from opening too early (cerclage). The stitches (sutures) are removed between weeks 36 and 38 to avoid problems during labor. Cerclage may be recommended if:  You have a history of miscarriages or preterm births without a known cause.  You have a short cervix. A short cervix is identified by ultrasound.  Your cervix has dilated before 24 weeks of pregnancy. Follow these instructions at home:  Get plenty of rest and lessen activity as told by your health care provider. Ask your health care provider what activities are safe for you.  If pelvic rest was recommended, you shouldnot have sex, use tampons, douche, or place anything inside your vagina until your health care provider says that this is okay.  Take over-the-counter and prescription medicines only as told by your health care provider.  Keep all follow-up visits and prenatal visits as told by your health care provider. This is important. Get help right away if:  You have vaginal bleeding, even if it is a small amount or even if it is painless.  You have pain in your abdomen or your lower back.  You have a feeling of increased pressure in your pelvis.  You have vaginal discharge that changes from clear, white, or light yellow to pink or tan.  You have a fever.  You have severe nausea or vomiting. Summary  Cervical insufficiency is when the cervix is weak and starts to dilate and efface before the  pregnancy is at term and before labor starts.  Symptoms of this condition can vary from no symptoms to mild symptoms that start between weeks 14 and 20 of pregnancy. The symptoms may  last several days or weeks.  This condition may be managed by limiting physical activity, having pelvic rest, or having cervical cerclage.  If pelvic rest was recommended, you should not have sex, use tampons, use a douche, or place anything inside your vagina until your health care provider says that this is okay. This information is not intended to replace advice given to you by your health care provider. Make sure you discuss any questions you have with your health care provider. Document Released: 07/08/2005 Document Revised: 07/11/2016 Document Reviewed: 07/11/2016 Elsevier Interactive Patient Education  2017 [employer].

## 2016-10-10 ENCOUNTER — Telehealth: Payer: Self-pay | Admitting: *Deleted

## 2016-10-10 LAB — GC/CHLAMYDIA PROBE AMP
CHLAMYDIA, DNA PROBE: NEGATIVE
Neisseria gonorrhoeae by PCR: NEGATIVE

## 2016-10-10 LAB — URINE CULTURE

## 2016-10-10 NOTE — Telephone Encounter (Signed)
Patient called with complaints of more lower abdominal and vaginal pressure. She was found to have a short cervix on 3/20 and was prescribed prometrium. She is not bleeding but is concerned about the increase in pressure. [facility]. Pt concerned that her pregnancy would be terminated but reassured patient that she may just be put on bedrest. Patient very [facility] but advised she needed to be seen for increased vaginal pressure. Pt stated she would go.

## 2016-10-18 ENCOUNTER — Inpatient Hospital Stay (HOSPITAL_COMMUNITY)
Admission: AD | Admit: 2016-10-18 | Discharge: 2016-10-19 | Disposition: A | Discharge status: Home / Self Care | Payer: Medicaid Other | Source: Ambulatory Visit | Attending: Obstetrics and Gynecology | Admitting: Obstetrics and Gynecology

## 2016-10-18 DIAGNOSIS — Z87891 Personal history of nicotine dependence: Secondary | ICD-10-CM | POA: Insufficient documentation

## 2016-10-18 DIAGNOSIS — N898 Other specified noninflammatory disorders of vagina: Secondary | ICD-10-CM | POA: Insufficient documentation

## 2016-10-18 DIAGNOSIS — O26892 Other specified pregnancy related conditions, second trimester: Secondary | ICD-10-CM | POA: Insufficient documentation

## 2016-10-18 DIAGNOSIS — Z8249 Family history of ischemic heart disease and other diseases of the circulatory system: Secondary | ICD-10-CM | POA: Insufficient documentation

## 2016-10-18 DIAGNOSIS — R109 Unspecified abdominal pain: Secondary | ICD-10-CM | POA: Insufficient documentation

## 2016-10-18 DIAGNOSIS — Z3A21 21 weeks gestation of pregnancy: Secondary | ICD-10-CM | POA: Insufficient documentation

## 2016-10-18 DIAGNOSIS — O26899 Other specified pregnancy related conditions, unspecified trimester: Secondary | ICD-10-CM

## 2016-10-18 DIAGNOSIS — Z841 Family history of disorders of kidney and ureter: Secondary | ICD-10-CM | POA: Insufficient documentation

## 2016-10-18 DIAGNOSIS — Z833 Family history of diabetes mellitus: Secondary | ICD-10-CM | POA: Insufficient documentation

## 2016-10-18 DIAGNOSIS — O26872 Cervical shortening, second trimester: Secondary | ICD-10-CM

## 2016-10-19 ENCOUNTER — Encounter (HOSPITAL_COMMUNITY): Payer: Self-pay | Admitting: *Deleted

## 2016-10-19 ENCOUNTER — Inpatient Hospital Stay (HOSPITAL_COMMUNITY): Payer: Medicaid Other

## 2016-10-19 DIAGNOSIS — Z8249 Family history of ischemic heart disease and other diseases of the circulatory system: Secondary | ICD-10-CM | POA: Diagnosis not present

## 2016-10-19 DIAGNOSIS — O26892 Other specified pregnancy related conditions, second trimester: Secondary | ICD-10-CM | POA: Diagnosis not present

## 2016-10-19 DIAGNOSIS — Z3A21 21 weeks gestation of pregnancy: Secondary | ICD-10-CM | POA: Diagnosis not present

## 2016-10-19 DIAGNOSIS — Z841 Family history of disorders of kidney and ureter: Secondary | ICD-10-CM | POA: Diagnosis not present

## 2016-10-19 DIAGNOSIS — R109 Unspecified abdominal pain: Secondary | ICD-10-CM | POA: Diagnosis not present

## 2016-10-19 DIAGNOSIS — Z833 Family history of diabetes mellitus: Secondary | ICD-10-CM | POA: Diagnosis not present

## 2016-10-19 DIAGNOSIS — N898 Other specified noninflammatory disorders of vagina: Secondary | ICD-10-CM | POA: Diagnosis present

## 2016-10-19 DIAGNOSIS — Z87891 Personal history of nicotine dependence: Secondary | ICD-10-CM | POA: Diagnosis not present

## 2016-10-19 LAB — URINALYSIS, ROUTINE W REFLEX MICROSCOPIC
BILIRUBIN URINE: NEGATIVE
Glucose, UA: 50 mg/dL — AB
HGB URINE DIPSTICK: NEGATIVE
KETONES UR: NEGATIVE mg/dL
Leukocytes, UA: NEGATIVE
NITRITE: NEGATIVE
PROTEIN: NEGATIVE mg/dL
Specific Gravity, Urine: 1.023 (ref 1.005–1.030)
pH: 5 (ref 5.0–8.0)

## 2016-10-19 LAB — WET PREP, GENITAL
CLUE CELLS WET PREP: NONE SEEN
Sperm: NONE SEEN
TRICH WET PREP: NONE SEEN
YEAST WET PREP: NONE SEEN

## 2016-10-19 NOTE — MAU Note (Signed)
Have been having a lot of pelvic pressure. On u/s 3/20 my cervix was thinning. Work time cut from 8hrs to [employer]. Having thick, mucousy d/c and my stomach is getting tight.

## 2016-10-19 NOTE — Discharge Instructions (Signed)
Abdominal Pain During Pregnancy °Belly (abdominal) pain is common during pregnancy. Most of the time, it is not a serious problem. Other times, it can be a sign that something is wrong with the pregnancy. Always tell your doctor if you have belly pain. °Follow these instructions at home: °Monitor your belly pain for any changes. The following actions may help you feel better: °· Do not have sex (intercourse) or put anything in your vagina until you feel better. °· Rest until your pain stops. °· Drink clear fluids if you feel sick to your stomach (nauseous). Do not eat solid food until you feel better. °· Only take medicine as told by your doctor. °· Keep all doctor visits as told. °Get help right away if: °· You are bleeding, leaking fluid, or pieces of tissue come out of your vagina. °· You have more pain or cramping. °· You keep throwing up (vomiting). °· You have pain when you pee (urinate) or have blood in your pee. °· You have a fever. °· You do not feel your baby moving as much. °· You feel very weak or feel like passing out. °· You have trouble breathing, with or without belly pain. °· You have a very bad headache and belly pain. °· You have fluid leaking from your vagina and belly pain. °· You keep having watery poop (diarrhea). °· Your belly pain does not go away after resting, or the pain gets worse. °This information is not intended to replace advice given to you by your health care provider. Make sure you discuss any questions you have with your health care provider. °Document Released: 06/26/2009 Document Revised: 02/14/2016 Document Reviewed: 02/04/2013 °Elsevier Interactive Patient Education © 2017 Elsevier Inc. ° °

## 2016-10-19 NOTE — MAU Provider Note (Signed)
History     CSN: [id number]  Arrival date and time: 10/18/16 2357   First Provider Initiated Contact with Patient 10/19/16 0038      Chief Complaint  Patient presents with  . pelvic pressure  . Vaginal Discharge   HPI Ms. Robyn Willis is a 26 y.o. G3P1011 at [redacted]w[redacted]d who presents to MAU today with complaint of lower abdominal and vaginal pressure. She states that this has been present for a while, but worse recently. She was seen in the office at [facility] on 10/08/16 and had [place] that showed a shortened cervix. She also complains of mucus discharge today. She denies vaginal bleeding, LOF or contractions. She has had occasional cramps. She reports good fetal movement.    OB History    Gravida Para Term Preterm AB Living   SAB TAB Ectopic Multiple Live Births   2       1      Past Medical History:  Diagnosis Date  . Medical history non-contributory   . Pregnant 01/02/2016    Past Surgical History:  Procedure Laterality Date  . NO PAST SURGERIES      Family History  Problem Relation Age of Onset  . Kidney disease Mother   . Diabetes Mother   . Hypertension Mother   . Diabetes Father   . Hypertension Father   . Heart disease Maternal Grandmother   . Alzheimer's disease Paternal Grandmother     Social History  Substance Use Topics  . Smoking status: Former Games developer  . Smokeless tobacco: Never Used  . Alcohol use No    Allergies:  PCN  No prescriptions prior to admission.    Review of Systems  Constitutional: Negative for fever.  Gastrointestinal: Positive for abdominal pain.  Genitourinary: Positive for vaginal discharge. Negative for vaginal bleeding.   Physical Exam   Blood pressure 116/75, pulse 71, temperature 98.6 F (37 C), resp. rate 18, height  (1.626 m), weight 119 lb (54 kg), last menstrual period 05/20/2016.  Physical Exam  Nursing note and vitals reviewed. Constitutional: She is oriented to person, place, and time. She appears  well-developed and well-nourished. No distress.  HENT:  Head: Normocephalic and atraumatic.  Cardiovascular: Normal rate.   Respiratory: Effort normal.  GI: Soft. She exhibits no distension and no mass. There is no tenderness. There is no rebound and no guarding.  Genitourinary: Uterus is enlarged. Uterus is not tender. Cervix exhibits no motion tenderness. There is erythema (mild) in the vagina. No bleeding in the vagina. Vaginal discharge (moderate, thick, white, clumpy discharge noted with some mucus) found.  Neurological: She is alert and oriented to person, place, and time.  Skin: Skin is warm and dry. No erythema.  Psychiatric: She has a normal mood and affect.  Dilation: Closed Effacement (%): 50 Cervical Position: Posterior Exam by:: Magnus Sinning, PA-C   Results for orders placed or performed during the hospital encounter of 10/18/16 (from the past 24 hour(s))  Urinalysis, Routine w reflex microscopic     Status: Abnormal   Collection Time: 10/19/16 12:25 AM  Result Value Ref Range   Color, Urine YELLOW YELLOW   APPearance HAZY (A) CLEAR   Specific Gravity, Urine 1.023 1.005 - 1.030   pH 5.0 5.0 - 8.0   Glucose, UA 50 (A) NEGATIVE mg/dL   Hgb urine dipstick NEGATIVE NEGATIVE   Bilirubin Urine NEGATIVE NEGATIVE   Ketones, ur NEGATIVE NEGATIVE mg/dL   Protein, ur  NEGATIVE NEGATIVE mg/dL   Nitrite NEGATIVE NEGATIVE   Leukocytes, UA NEGATIVE NEGATIVE  Wet prep, genital     Status: Abnormal   Collection Time: 10/19/16  1:35 AM  Result Value Ref Range   Yeast Wet Prep HPF POC NONE SEEN NONE SEEN   Trich, Wet Prep NONE SEEN NONE SEEN   Clue Cells Wet Prep HPF POC NONE SEEN NONE SEEN   WBC, Wet Prep HPF POC MODERATE (A) NONE SEEN   Sperm NONE SEEN     MAU Course  Procedures None  MDM FHR - 160 bpm with doppler Reviewed last [place] from the office. Cervical length was 1.8-2.6 cm  [place] today to assess cervical length Wet prep sent after speculum exam revealed discharge consistent  with a possible yeast infection vs vaginal progesterone.  Discussed patient with Dr. Emelda Fear. OK for discharge and follow-up as scheduled.  Assessment and Plan  A: SIUP at [redacted]w[redacted]d Abdominal pain in pregnancy, second trimester  P:  Discharge home Tylenol PRN for pain Second trimester precautions discussed Patient advised to follow-up with [facility] as scheduled for routine prenatal care Patient may return to MAU as needed or if her condition were to change or worsen   Marny Lowenstein, PA-C  10/19/2016, 2:29 AM

## 2016-10-21 ENCOUNTER — Other Ambulatory Visit: Payer: Self-pay | Admitting: Women's Health

## 2016-10-21 DIAGNOSIS — O26879 Cervical shortening, unspecified trimester: Secondary | ICD-10-CM

## 2016-10-21 DIAGNOSIS — IMO0001 Reserved for inherently not codable concepts without codable children: Secondary | ICD-10-CM

## 2016-10-22 ENCOUNTER — Encounter: Payer: Self-pay | Admitting: Obstetrics & Gynecology

## 2016-10-22 ENCOUNTER — Ambulatory Visit (INDEPENDENT_AMBULATORY_CARE_PROVIDER_SITE_OTHER): Payer: Medicaid Other | Admitting: Obstetrics & Gynecology

## 2016-10-22 ENCOUNTER — Ambulatory Visit (INDEPENDENT_AMBULATORY_CARE_PROVIDER_SITE_OTHER): Payer: Medicaid Other

## 2016-10-22 VITALS — BP 102/60 | HR 98 | Wt 118.0 lb

## 2016-10-22 DIAGNOSIS — O0992 Supervision of high risk pregnancy, unspecified, second trimester: Secondary | ICD-10-CM

## 2016-10-22 DIAGNOSIS — O26879 Cervical shortening, unspecified trimester: Secondary | ICD-10-CM

## 2016-10-22 DIAGNOSIS — Z331 Pregnant state, incidental: Secondary | ICD-10-CM | POA: Diagnosis not present

## 2016-10-22 DIAGNOSIS — O350XX0 Maternal care for (suspected) central nervous system malformation in fetus, not applicable or unspecified: Secondary | ICD-10-CM

## 2016-10-22 DIAGNOSIS — IMO0001 Reserved for inherently not codable concepts without codable children: Secondary | ICD-10-CM

## 2016-10-22 DIAGNOSIS — Z1389 Encounter for screening for other disorder: Secondary | ICD-10-CM

## 2016-10-22 LAB — POCT URINALYSIS DIPSTICK
Blood, UA: NEGATIVE
GLUCOSE UA: NEGATIVE
LEUKOCYTES UA: NEGATIVE
Nitrite, UA: NEGATIVE
Protein, UA: NEGATIVE

## 2016-10-22 NOTE — Progress Notes (Signed)
Fetal Surveillance Testing today:  sonogram   High Risk Pregnancy Diagnosis(es):   Short cervix, assymptomatic  [id number] [redacted]w[redacted]d Estimated Date of Delivery: 02/24/17  Blood pressure 102/60, pulse 98, weight 118 lb (53.5 kg), last menstrual period 05/20/2016.  Urinalysis: Negative   HPI: The patient is being seen today for ongoing management of as above. Today she reports some pressure at times   BP weight and urine results all reviewed and noted. Patient reports good fetal movement, denies any bleeding and no rupture of membranes symptoms or regular contractions.  Fundal Height:  22 Fetal Heart rate:  146 Edema:  none  Patient is without complaints other than noted in her HPI. All questions were answered.  All lab and sonogram results have been reviewed. Comments:    Assessment:  1.  Pregnancy at [redacted]w[redacted]d,  Estimated Date of Delivery: 02/24/17 :                          2.  Short cervix, assymptomatic                        3.  CP cyst, uncahnged, unilateral  Medication(s) Plans:  continue vaginal prometrium 200 mg  Treatment Plan:  Re check cervical length in 2 weeks  Return in about 2 weeks (around 11/05/2016) for cervical lenght, HROB. for appointment for high risk OB care  No orders of the defined types were placed in this encounter.  Orders Placed This Encounter  Procedures  . US OB Limited  . POCT urinalysis dipstick

## 2016-10-22 NOTE — Progress Notes (Signed)
Korea 22+1 wks,cephalic,post pl gr 1,normal ov's bilat,svp of fluid 5 cm,fhr 157 bpm,right choroid plexus cyst 3.9 x 3.3 x 3.4 mm,dynamic shortened cervix w/ funneling  3.3 cm to 1.4 cm, 1.1 cm w/pressure,EFW 479 g

## 2016-11-04 ENCOUNTER — Inpatient Hospital Stay (HOSPITAL_COMMUNITY)
Admission: EM | Admit: 2016-11-04 | Discharge: 2016-11-04 | Disposition: A | Discharge status: Home / Self Care | Payer: Medicaid Other | Attending: Family Medicine | Admitting: Family Medicine

## 2016-11-04 ENCOUNTER — Encounter (HOSPITAL_COMMUNITY): Payer: Self-pay

## 2016-11-04 DIAGNOSIS — Z87891 Personal history of nicotine dependence: Secondary | ICD-10-CM | POA: Insufficient documentation

## 2016-11-04 DIAGNOSIS — Z833 Family history of diabetes mellitus: Secondary | ICD-10-CM | POA: Diagnosis not present

## 2016-11-04 DIAGNOSIS — O26892 Other specified pregnancy related conditions, second trimester: Secondary | ICD-10-CM | POA: Insufficient documentation

## 2016-11-04 DIAGNOSIS — R109 Unspecified abdominal pain: Secondary | ICD-10-CM | POA: Diagnosis present

## 2016-11-04 DIAGNOSIS — Z820 Family history of epilepsy and other diseases of the nervous system: Secondary | ICD-10-CM | POA: Insufficient documentation

## 2016-11-04 DIAGNOSIS — Z79899 Other long term (current) drug therapy: Secondary | ICD-10-CM | POA: Insufficient documentation

## 2016-11-04 DIAGNOSIS — Z880 Allergy status to penicillin: Secondary | ICD-10-CM | POA: Insufficient documentation

## 2016-11-04 DIAGNOSIS — Z3A24 24 weeks gestation of pregnancy: Secondary | ICD-10-CM | POA: Insufficient documentation

## 2016-11-04 DIAGNOSIS — Z0371 Encounter for suspected problem with amniotic cavity and membrane ruled out: Secondary | ICD-10-CM

## 2016-11-04 DIAGNOSIS — Z841 Family history of disorders of kidney and ureter: Secondary | ICD-10-CM | POA: Diagnosis not present

## 2016-11-04 DIAGNOSIS — Z8249 Family history of ischemic heart disease and other diseases of the circulatory system: Secondary | ICD-10-CM | POA: Insufficient documentation

## 2016-11-04 DIAGNOSIS — R102 Pelvic and perineal pain: Secondary | ICD-10-CM | POA: Diagnosis not present

## 2016-11-04 DIAGNOSIS — N898 Other specified noninflammatory disorders of vagina: Secondary | ICD-10-CM | POA: Diagnosis not present

## 2016-11-04 LAB — COMPREHENSIVE METABOLIC PANEL
ALK PHOS: 53 U/L (ref 38–126)
ALT: 10 U/L — AB (ref 14–54)
AST: 15 U/L (ref 15–41)
Albumin: 3 g/dL — ABNORMAL LOW (ref 3.5–5.0)
Anion gap: 7 (ref 5–15)
BUN: 6 mg/dL (ref 6–20)
CALCIUM: 8.1 mg/dL — AB (ref 8.9–10.3)
CHLORIDE: 105 mmol/L (ref 101–111)
CO2: 23 mmol/L (ref 22–32)
CREATININE: 0.37 mg/dL — AB (ref 0.44–1.00)
Glucose, Bld: 87 mg/dL (ref 65–99)
Potassium: 3.4 mmol/L — ABNORMAL LOW (ref 3.5–5.1)
SODIUM: 135 mmol/L (ref 135–145)
Total Bilirubin: 0.6 mg/dL (ref 0.3–1.2)
Total Protein: 6 g/dL — ABNORMAL LOW (ref 6.5–8.1)

## 2016-11-04 LAB — CBC
HCT: 35.2 % — ABNORMAL LOW (ref 36.0–46.0)
HEMOGLOBIN: 12 g/dL (ref 12.0–15.0)
MCH: 32 pg (ref 26.0–34.0)
MCHC: 34.1 g/dL (ref 30.0–36.0)
MCV: 93.9 fL (ref 78.0–100.0)
PLATELETS: 212 10*3/uL (ref 150–400)
RBC: 3.75 MIL/uL — AB (ref 3.87–5.11)
RDW: 12.2 % (ref 11.5–15.5)
WBC: 12.8 10*3/uL — ABNORMAL HIGH (ref 4.0–10.5)

## 2016-11-04 LAB — PROTEIN / CREATININE RATIO, URINE
CREATININE, URINE: 98 mg/dL
PROTEIN CREATININE RATIO: 0.1 mg/mg{creat} (ref 0.00–0.15)
Total Protein, Urine: 10 mg/dL

## 2016-11-04 LAB — WET PREP, GENITAL
Clue Cells Wet Prep HPF POC: NONE SEEN
SPERM: NONE SEEN
TRICH WET PREP: NONE SEEN
Yeast Wet Prep HPF POC: NONE SEEN

## 2016-11-04 LAB — URINALYSIS, ROUTINE W REFLEX MICROSCOPIC
BILIRUBIN URINE: NEGATIVE
GLUCOSE, UA: NEGATIVE mg/dL
HGB URINE DIPSTICK: NEGATIVE
KETONES UR: 5 mg/dL — AB
Leukocytes, UA: NEGATIVE
Nitrite: NEGATIVE
PROTEIN: NEGATIVE mg/dL
Specific Gravity, Urine: 1.015 (ref 1.005–1.030)
pH: 6 (ref 5.0–8.0)

## 2016-11-04 LAB — AMNISURE RUPTURE OF MEMBRANE (ROM) NOT AT ARMC: AMNISURE: NEGATIVE

## 2016-11-04 MED ORDER — SODIUM CHLORIDE 0.9 % IV SOLN
INTRAVENOUS | Status: DC
Start: 2016-11-04 — End: 2016-11-04
  Administered 2016-11-04: 05:00:00 via INTRAVENOUS

## 2016-11-04 MED ORDER — IBUPROFEN 600 MG PO TABS
600.0000 mg | ORAL_TABLET | Freq: Once | ORAL | Status: AC
Start: 2016-11-04 — End: 2016-11-04
  Administered 2016-11-04: 600 mg via ORAL
  Filled 2016-11-04: qty 1

## 2016-11-04 MED ORDER — NIFEDIPINE 10 MG PO CAPS: 10.0000 mg | ORAL_CAPSULE | Freq: Four times a day (QID) | ORAL | 1 refills | Status: DC | PRN

## 2016-11-04 MED ORDER — NIFEDIPINE 10 MG PO CAPS
10.0000 mg | ORAL_CAPSULE | Freq: Once | ORAL | Status: AC
Start: 2016-11-04 — End: 2016-11-04
  Administered 2016-11-04: 10 mg via ORAL
  Filled 2016-11-04: qty 1

## 2016-11-04 NOTE — MAU Note (Signed)
Pt reports cramping 8/10. + FM. Pt reports leaking since 0200.

## 2016-11-04 NOTE — Progress Notes (Signed)
CSW provided Taxi voucher for patient to be transported back to [facility].   Blaine Hamper, MSW, LCSW Clinical Social Work [phone]

## 2016-11-04 NOTE — Discharge Instructions (Signed)
Preventing Preterm Birth °Preterm birth is when your baby is delivered between 20 weeks and 37 weeks of pregnancy. A full-term pregnancy lasts for at least 37 weeks. Preterm birth can be dangerous for your baby because the last few weeks of pregnancy are an important time for your baby's brain and lungs to grow. Many things can cause a baby to be born early. Sometimes the cause is not known. There are certain factors that make you more likely to experience preterm birth, such as: °· Having a previous baby born preterm. °· Being pregnant with twins or other multiples. °· Having had fertility treatment. °· Being overweight or underweight at the start of your pregnancy. °· Having any of the following during pregnancy: °¨ An infection, including a urinary tract infection (UTI) or an STI (sexually transmitted infection). °¨ High blood pressure. °¨ Diabetes. °¨ Vaginal bleeding. °· Being age 35 or older. °· Being age 18 or younger. °· Getting pregnant within 6 months of a previous pregnancy. °· Suffering extreme stress or physical or emotional abuse during pregnancy. °· Standing for long periods of time during pregnancy, such as working at a job that requires standing. °What are the risks? °The most serious risk of preterm birth is that the baby may not survive. This is more likely to happen if a baby is born before 34 weeks. Other risks and complications of preterm birth may include your baby having: °· Breathing problems. °· Brain damage that affects movement and coordination (cerebral palsy). °· Feeding difficulties. °· Vision or hearing problems. °· Infections or inflammation of the digestive tract (colitis). °· Developmental delays. °· Learning disabilities. °· Higher risk for diabetes, heart disease, and high blood pressure later in life. °What can I do to lower my risk? °Medical care °The most important thing you can do to lower your risk for preterm birth is to get routine medical care during pregnancy (prenatal  care). If you have a high risk of preterm birth, you may be referred to a health care provider who specializes in managing high-risk pregnancies (perinatologist). You may be given medicine to help prevent preterm birth. °Lifestyle changes °Certain lifestyle changes can also lower your risk of preterm birth: °· Wait at least 6 months after a pregnancy to become pregnant again. °· Try to plan pregnancy for when you are between 19 and 35 years old. °· Get to a healthy weight before getting pregnant. If you are overweight, work with your health care provider to safely lose weight. °· Do not use any products that contain nicotine or tobacco, such as cigarettes and e-cigarettes. If you need help quitting, ask your health care provider. °· Do not drink alcohol. °· Do not use drugs. °Where to find support: °For more support, consider: °· Talking with your health care provider. °· Talking with a therapist or substance abuse counselor, if you need help quitting. °· Working with a diet and nutrition specialist (dietitian) or a personal trainer to maintain a healthy weight. °· Joining a support group. °Where to find more information: °Learn more about preventing preterm birth from: °· Centers for Disease Control and Prevention: cdc.gov/reproductivehealth/maternalinfanthealth/pretermbirth.htm °· March of Dimes: marchofdimes.org/complications/premature-babies.aspx °· American Pregnancy Association: americanpregnancy.org/labor-and-birth/premature-labor °Contact a health care provider if: °· You have any of the following signs of preterm labor before 37 weeks: °¨ A change or increase in vaginal discharge. °¨ Fluid leaking from your vagina. °¨ Pressure or cramps in your lower abdomen. °¨ A backache that does not go away or gets worse. °¨   Regular tightening (contractions) in your lower abdomen. °Summary °· Preterm birth means having your baby during weeks 20-37 of pregnancy. °· Preterm birth may put your baby at risk for physical and  mental problems. °· Getting good prenatal care can help prevent preterm birth. °· You can lower your risk of preterm birth by making certain lifestyle changes, such as not smoking and not using alcohol. °This information is not intended to replace advice given to you by your health care provider. Make sure you discuss any questions you have with your health care provider. °Document Released: 08/22/2015 Document Revised: 03/16/2016 Document Reviewed: 03/16/2016 °Elsevier Interactive Patient Education © 2017 Elsevier Inc. ° °

## 2016-11-04 NOTE — ED Triage Notes (Signed)
Patient states that she started cramping last night around 10 pm and she went to sleep and it woke her up at 1 am.  Patient states that she felt a gush of fluid around 0200 that was clear.

## 2016-11-04 NOTE — ED Provider Notes (Signed)
[facility] DEPT Provider Note   CSN: [id number] Arrival date & time: 11/04/16  0355     History   Chief Complaint Chief Complaint  Patient presents with  . Abdominal Pain    HPI Robyn Willis is a 26 y.o. female.  Patient presents [redacted] weeks pregnant with abdominal cramping and a gush of clear fluid at 2 this morning. This is the first time this is happened during this pregnancy. Patient is a high-risk pregnancy and has a history of miscarriage or proximal me 9 weeks. Patient did have one vaginal delivery. Patient is followed by OB locally. Patient denies bleeding fevers or chills. Patient has a short cervix. Patient's OB is Dr. Wallace Keller      Past Medical History:  Diagnosis Date  . Medical history non-contributory   . Pregnant 01/02/2016    Patient Active Problem List   Diagnosis Date Noted  . Short cervix, antepartum 10/08/2016  . Hives 09/10/2016  . Marijuana use 08/15/2016  . Depression 08/13/2016  . Supervision of high risk pregnancy, antepartum 01/02/2016    Past Surgical History:  Procedure Laterality Date  . NO PAST SURGERIES      OB History    Gravida Para Term Preterm AB Living   SAB TAB Ectopic Multiple Live Births   0       1       Home Medications    Prior to Admission medications   Medication Sig Start Date End Date Taking? Authorizing Provider  Prenat w/o A-FeCbGl-DSS-FA-DHA (CITRANATAL ASSURE) 35-1 & 300 MG tablet One tablet and one capsule daily 08/26/16  Yes Cheral Marker, CNM  progesterone (PROMETRIUM) 200 MG capsule Place 1 capsule (200 mg total) vaginally at bedtime. 10/08/16  Yes Cheral Marker, CNM  ranitidine (ZANTAC) 150 MG tablet Take 1 tablet (150 mg total) by mouth 2 (two) times daily. 09/10/16  Yes Cheral Marker, CNM    Family History Family History  Problem Relation Age of Onset  . Kidney disease Mother   . Diabetes Mother   . Hypertension Mother   . Diabetes Father   . Hypertension Father   .  Heart disease Maternal Grandmother   . Alzheimer's disease Paternal Grandmother     Social History Social History  Substance Use Topics  . Smoking status: Former Games developer  . Smokeless tobacco: Never Used  . Alcohol use No     Allergies   Penicillins   Review of Systems Review of Systems  Constitutional: Negative for chills and fever.  HENT: Negative for congestion.   Eyes: Negative for visual disturbance.  Respiratory: Negative for shortness of breath.   Cardiovascular: Negative for chest pain.  Gastrointestinal: Positive for abdominal pain. Negative for vomiting.  Genitourinary: Negative for dysuria, flank pain and vaginal bleeding.  Musculoskeletal: Negative for back pain, neck pain and neck stiffness.  Skin: Negative for rash.  Neurological: Negative for light-headedness and headaches.     Physical Exam Updated Vital Signs BP (!) 137/96 (BP Location: Left Arm)   Pulse (!) 114   Temp 98.2 F (36.8 C) (Oral)   Resp 16   Ht  (1.626 m)   Wt 118 lb (53.5 kg)   LMP 05/20/2016 (Approximate)   SpO2 100%   BMI 20.25 kg/m   Physical Exam  Constitutional: She is oriented to person, place, and time. She appears well-developed and well-nourished.  HENT:  Head: Normocephalic and atraumatic.  Eyes: Conjunctivae are  normal. Right eye exhibits no discharge. Left eye exhibits no discharge.  Neck: Normal range of motion. Neck supple. No tracheal deviation present.  Cardiovascular: Regular rhythm.   Pulmonary/Chest: Effort normal and breath sounds normal.  Abdominal: Soft. There is tenderness (minimal pelvic and infraumbilical). There is no guarding.  Musculoskeletal: She exhibits no edema.  Neurological: She is alert and oriented to person, place, and time.  Skin: Skin is warm. No rash noted.  Psychiatric: She has a normal mood and affect.  Nursing note and vitals reviewed.    ED Treatments / Results  Labs (all labs ordered are listed, but only abnormal results are  displayed) Labs Reviewed  URINE CULTURE  URINALYSIS, ROUTINE W REFLEX MICROSCOPIC    EKG  EKG Interpretation None       Radiology No results found.  Procedures Procedures (including critical care time)  Medications Ordered in ED Medications - No data to display   Initial Impression / Assessment and Plan / ED Course  I have reviewed the triage vital signs and the nursing notes.  Pertinent labs & imaging results that were available during my care of the patient were reviewed by me and considered in my medical decision making (see chart for details).    Patient presents with abdominal cramping worsened after she felt a gush of fluid. Patient's high risk pregnancy 24 weeks. Bedside ultrasound confirmed live intrauterine pregnancy heart rate approximately 130s. Good fetal movement on ultrasound exam. Patient being monitored in the ER. Discussed with Dr. Shawnie Pons on for obstetrics who agrees with recommendation of transfer to [facility] [place] hospital immediately for further monitoring. Initially patient refused and wanted to leave and medical advice per nursing. I discussed the importance and high risk nature of her situation and she agreed to be transferred to [facility] hospital to see obstetrics specialist. Ambulance notified.  The patients results and plan were reviewed and discussed.   Any x-rays performed were independently reviewed by myself.   Differential diagnosis were considered with the presenting HPI.  Medications  0.9 %  sodium chloride infusion (not administered)    Vitals:   11/04/16 0403 11/04/16 0411  BP:  (!) 137/96  Pulse:  (!) 114  Resp:  16  Temp:  98.2 F (36.8 C)  TempSrc:  Oral  SpO2:  100%  Weight: 118 lb (53.5 kg)   Height:  (1.626 m)     Final diagnoses:  [redacted] weeks gestation of pregnancy  Abdominal cramping      Final Clinical Impressions(s) / ED Diagnoses   Final diagnoses:  None    New Prescriptions New Prescriptions   No medications  on file     Blane Ohara, MD 11/04/16 [phone]

## 2016-11-04 NOTE — MAU Provider Note (Signed)
History     CSN: [id number]  Arrival date and time: 11/04/16 0355   First Provider Initiated Contact with Patient 11/04/16 [phone]      Chief Complaint  Patient presents with  . Pelvic Pain   Pelvic Pain  The patient's primary symptoms include pelvic pain and vaginal discharge. This is a new problem. The current episode started yesterday. The problem occurs intermittently. The problem has been unchanged. The pain is moderate. The problem affects both sides. She is pregnant. Pertinent negatives include no abdominal pain or headaches. The vaginal discharge was white and watery. There has been no bleeding. She has not been passing clots. She has not been passing tissue. Nothing aggravates the symptoms. She has tried nothing for the symptoms.    Past Medical History:  Diagnosis Date  . Medical history non-contributory   . Pregnant 01/02/2016    Past Surgical History:  Procedure Laterality Date  . NO PAST SURGERIES      Family History  Problem Relation Age of Onset  . Kidney disease Mother   . Diabetes Mother   . Hypertension Mother   . Diabetes Father   . Hypertension Father   . Heart disease Maternal Grandmother   . Alzheimer's disease Paternal Grandmother     Social History  Substance Use Topics  . Smoking status: Former Games developer  . Smokeless tobacco: Never Used  . Alcohol use No    Allergies:  Allergies  Allergen Reactions  . Penicillins Hives    Has patient had a PCN reaction causing immediate rash, facial/tongue/throat swelling, SOB or lightheadedness with hypotension: No Has patient had a PCN reaction causing severe rash involving mucus membranes or skin necrosis: No Has patient had a PCN reaction that required hospitalization No Has patient had a PCN reaction occurring within the last 10 years: No If all of the above answers are "NO", then may proceed with Cephalosporin     Prescriptions Prior to Admission  Medication Sig Dispense Refill Last Dose  . Prenat  w/o A-FeCbGl-DSS-FA-DHA (CITRANATAL ASSURE) 35-1 & 300 MG tablet One tablet and one capsule daily 60 tablet 11 11/03/2016 at Unknown time  . progesterone (PROMETRIUM) 200 MG capsule Place 1 capsule (200 mg total) vaginally at bedtime. 30 capsule 5 11/03/2016 at Unknown time  . ranitidine (ZANTAC) 150 MG tablet Take 1 tablet (150 mg total) by mouth 2 (two) times daily. (Patient taking differently: Take 150 mg by mouth 2 (two) times daily as needed for heartburn. ) 60 tablet 6 Past Month at Unknown time    Review of Systems  Eyes: Negative for visual disturbance.  Gastrointestinal: Negative for abdominal pain.  Genitourinary: Positive for pelvic pain and vaginal discharge.  Neurological: Negative for headaches.   Physical Exam   Blood pressure 122/80, pulse (!) 107, temperature 98.2 F (36.8 C), resp. rate 18, height  (1.626 m), weight 53.5 kg (118 lb), last menstrual period 05/20/2016, SpO2 100 %.  Physical Exam  Nursing note and vitals reviewed. Constitutional: She is oriented to person, place, and time. She appears well-developed and well-nourished. No distress.  HENT:  Head: Normocephalic.  Cardiovascular: Normal rate.   Respiratory: Effort normal.  GI: Soft. There is no tenderness. There is no rebound.  Genitourinary:  Genitourinary Comments:  External: no lesion Vagina: small amount of white discharge Cervix: pink, smooth, no CMT Uterus: NSSC Adnexa: NT   Neurological: She is alert and oriented to person, place, and time.  Skin: Skin is warm and dry.  Psychiatric: She  has a normal mood and affect.  FHT: 155, moderate with 10x10 accels, no decels Toco: no UCs   MAU Course  Procedures  None  MDM 0800 Care transferred J. Rasch Tawnya Crook 8:14 AM 11/04/16  Initial fern slide positive per the RN, however fern slide was not labeled with patient identifier. Positive fern not consistent with provider exam. Amnisure negative Speculum re-performed, no pooling  of fluid, fern slide negative.  1 dose of procardia, and 600 mg of ibuprofen given PO Patient says her pain has significantly improved. No contractions noted on fetal tracing.   Assessment and Plan    A:  1. Abdominal cramping   2. Encounter for suspected premature rupture of amniotic membranes, with rupture of membranes not found     P:  Discharge home in stable condition Rx: procardia Return to MAU if leaking, pain continues Follow up with OB on 4/18 as scheduled Preterm labor precautions.  Duane Lope, NP 11/04/2016 10:46 AM

## 2016-11-05 LAB — CULTURE, OB URINE: Culture: NO GROWTH

## 2016-11-05 LAB — GC/CHLAMYDIA PROBE AMP (~~LOC~~) NOT AT ARMC
CHLAMYDIA, DNA PROBE: NEGATIVE
Neisseria Gonorrhea: NEGATIVE

## 2016-11-06 ENCOUNTER — Ambulatory Visit (INDEPENDENT_AMBULATORY_CARE_PROVIDER_SITE_OTHER): Payer: Medicaid Other | Admitting: Obstetrics and Gynecology

## 2016-11-06 ENCOUNTER — Encounter: Payer: Self-pay | Admitting: Obstetrics and Gynecology

## 2016-11-06 ENCOUNTER — Other Ambulatory Visit: Payer: Self-pay | Admitting: Obstetrics & Gynecology

## 2016-11-06 ENCOUNTER — Ambulatory Visit (INDEPENDENT_AMBULATORY_CARE_PROVIDER_SITE_OTHER): Payer: Medicaid Other

## 2016-11-06 VITALS — BP 120/68 | HR 72 | Wt 120.0 lb

## 2016-11-06 DIAGNOSIS — O26879 Cervical shortening, unspecified trimester: Secondary | ICD-10-CM

## 2016-11-06 DIAGNOSIS — O0990 Supervision of high risk pregnancy, unspecified, unspecified trimester: Secondary | ICD-10-CM | POA: Diagnosis not present

## 2016-11-06 DIAGNOSIS — Z1389 Encounter for screening for other disorder: Secondary | ICD-10-CM | POA: Diagnosis not present

## 2016-11-06 DIAGNOSIS — Z331 Pregnant state, incidental: Secondary | ICD-10-CM

## 2016-11-06 DIAGNOSIS — O26872 Cervical shortening, second trimester: Secondary | ICD-10-CM

## 2016-11-06 LAB — POCT URINALYSIS DIPSTICK
Blood, UA: NEGATIVE
Glucose, UA: NEGATIVE
LEUKOCYTES UA: NEGATIVE
Nitrite, UA: NEGATIVE

## 2016-11-06 MED ORDER — NIFEDIPINE ER OSMOTIC RELEASE 30 MG PO TB24: 30.0000 mg | ORAL_TABLET | Freq: Two times a day (BID) | ORAL | 2 refills | Status: DC

## 2016-11-06 MED ORDER — BETAMETHASONE SOD PHOS & ACET 6 (3-3) MG/ML IJ SUSP
12.0000 mg | INTRAMUSCULAR | Status: AC
  Administered 2016-11-06 – 2016-11-07 (×2): 12 mg via INTRAMUSCULAR

## 2016-11-06 NOTE — Assessment & Plan Note (Signed)
Short cervix 1.5 cm

## 2016-11-06 NOTE — Progress Notes (Signed)
High Risk Pregnancy HROB Diagnosis(es):   Short cervix  G3P1011 [redacted]w[redacted]d Estimated Date of Delivery: 02/24/17    HPI: The patient is being seen today for ongoing management of above. Today she has no complaints aside from concerns regarding her short cervix. States last measurement was ~ 1.8 cm. She notes having decreased activity with work as well as at home. She was seen at MAU on 11/04/16 and was discharged home with procardia.  Patient reports good fetal movement, denies any bleeding and no rupture of membranes symptoms or regular contractions.   BP weight and urine results reviewed and noted. Blood pressure 120/68, pulse 72, weight 120 lb (54.4 kg), last menstrual period 05/20/2016.  Fundal Height:  23 Fetal Heart rate:  150 Physical Examination: Abdomen - soft, nontender, nondistended, no masses or organomegaly                                     Pelvic - 13.3 mm, 13.0 mm, and 15.0 mm, 15.5 mm measurements of cervix. 2 cm wide internal os with 10.5 mm cervical funneling. Fetus is vertex                                     Edema:  none  Urinalysis: POSITIVE: small ketones, trace protein,   Fetal Surveillance Testing today:  Bedside US  Lab and sonogram results have been reviewed. Comments:   Assessment:  1.  Pregnancy at [redacted]w[redacted]d,  G3P1011   :  Estimated Date of Delivery: 02/24/17                        2.  Short cervix, rule out preterm labor                        3.   Medication(s) Plans:  Switch to procardia XL 30 tablets BID, betamethasone daily x2 for lung maturation  Treatment Plan:  See above, taken out of work today  Follow up in 1 day for second betamethasone injection  By signing my name below, I, Sonum Patel, attest that this documentation has been prepared under the direction and in the presence of Tilda Burrow, MD. Electronically Signed: Sonum Patel, Neurosurgeon. . 2:43 PM.  I personally performed the services described in this documentation, which was SCRIBED in my  presence. The recorded information has been reviewed and considered accurate. It has been edited as necessary during review. Tilda Burrow, MD

## 2016-11-06 NOTE — Progress Notes (Signed)
[place] 24+2 wks,cephalic,post pl gr 1,normal ov's bilat,svp of fluid 7.3 cm,fhr 164 bpm,dynamic cervix w/a V shaped  funneling 2.2 -1 cm

## 2016-11-07 ENCOUNTER — Encounter: Payer: Self-pay | Admitting: *Deleted

## 2016-11-07 ENCOUNTER — Ambulatory Visit (INDEPENDENT_AMBULATORY_CARE_PROVIDER_SITE_OTHER): Payer: Medicaid Other | Admitting: *Deleted

## 2016-11-07 VITALS — BP 122/78 | HR 110 | Wt 120.6 lb

## 2016-11-07 DIAGNOSIS — Z1389 Encounter for screening for other disorder: Secondary | ICD-10-CM

## 2016-11-07 DIAGNOSIS — O26872 Cervical shortening, second trimester: Secondary | ICD-10-CM

## 2016-11-07 DIAGNOSIS — Z331 Pregnant state, incidental: Secondary | ICD-10-CM

## 2016-11-07 LAB — POCT URINALYSIS DIPSTICK
Blood, UA: NEGATIVE
GLUCOSE UA: 2
KETONES UA: NEGATIVE
Leukocytes, UA: NEGATIVE
Nitrite, UA: NEGATIVE
Protein, UA: NEGATIVE

## 2016-11-07 NOTE — Progress Notes (Signed)
Betamethasone  given IM right ventrogluteal without complications.

## 2016-11-14 ENCOUNTER — Encounter: Payer: Self-pay | Admitting: *Deleted

## 2016-11-14 ENCOUNTER — Ambulatory Visit: Payer: Medicaid Other

## 2016-11-17 ENCOUNTER — Inpatient Hospital Stay (HOSPITAL_COMMUNITY)
Admission: AD | Admit: 2016-11-17 | Discharge: 2016-11-17 | Disposition: A | Discharge status: Home / Self Care | Payer: Medicaid Other | Source: Ambulatory Visit | Attending: Obstetrics & Gynecology | Admitting: Obstetrics & Gynecology

## 2016-11-17 ENCOUNTER — Encounter (HOSPITAL_COMMUNITY): Payer: Self-pay | Admitting: *Deleted

## 2016-11-17 ENCOUNTER — Inpatient Hospital Stay (HOSPITAL_COMMUNITY): Payer: Medicaid Other

## 2016-11-17 DIAGNOSIS — R109 Unspecified abdominal pain: Secondary | ICD-10-CM | POA: Diagnosis not present

## 2016-11-17 DIAGNOSIS — Z3A25 25 weeks gestation of pregnancy: Secondary | ICD-10-CM | POA: Diagnosis not present

## 2016-11-17 DIAGNOSIS — Z87891 Personal history of nicotine dependence: Secondary | ICD-10-CM | POA: Diagnosis not present

## 2016-11-17 DIAGNOSIS — N939 Abnormal uterine and vaginal bleeding, unspecified: Secondary | ICD-10-CM | POA: Diagnosis present

## 2016-11-17 DIAGNOSIS — O26899 Other specified pregnancy related conditions, unspecified trimester: Secondary | ICD-10-CM

## 2016-11-17 DIAGNOSIS — O26892 Other specified pregnancy related conditions, second trimester: Secondary | ICD-10-CM | POA: Diagnosis not present

## 2016-11-17 DIAGNOSIS — Z841 Family history of disorders of kidney and ureter: Secondary | ICD-10-CM | POA: Insufficient documentation

## 2016-11-17 DIAGNOSIS — O26872 Cervical shortening, second trimester: Secondary | ICD-10-CM | POA: Diagnosis not present

## 2016-11-17 DIAGNOSIS — Z8249 Family history of ischemic heart disease and other diseases of the circulatory system: Secondary | ICD-10-CM | POA: Insufficient documentation

## 2016-11-17 DIAGNOSIS — Z880 Allergy status to penicillin: Secondary | ICD-10-CM | POA: Insufficient documentation

## 2016-11-17 DIAGNOSIS — Z833 Family history of diabetes mellitus: Secondary | ICD-10-CM | POA: Insufficient documentation

## 2016-11-17 LAB — URINALYSIS, ROUTINE W REFLEX MICROSCOPIC
BILIRUBIN URINE: NEGATIVE
Glucose, UA: NEGATIVE mg/dL
Hgb urine dipstick: NEGATIVE
Ketones, ur: 20 mg/dL — AB
Nitrite: NEGATIVE
PH: 6 (ref 5.0–8.0)
Protein, ur: NEGATIVE mg/dL
SPECIFIC GRAVITY, URINE: 1.016 (ref 1.005–1.030)

## 2016-11-17 MED ORDER — NIFEDIPINE ER OSMOTIC RELEASE 30 MG PO TB24
30.0000 mg | ORAL_TABLET | Freq: Once | ORAL | Status: AC
  Administered 2016-11-17: 30 mg via ORAL
  Filled 2016-11-17: qty 1

## 2016-11-17 NOTE — Discharge Instructions (Signed)
. °Preterm Labor and Birth Information °The normal length of a pregnancy is 39-41 weeks. Preterm labor is when labor starts before 37 completed weeks of pregnancy. °What are the risk factors for preterm labor? °Preterm labor is more likely to occur in women who: °· Have certain infections during pregnancy such as a bladder infection, sexually transmitted infection, or infection inside the uterus (chorioamnionitis). °· Have a shorter-than-normal cervix. °· Have gone into preterm labor before. °· Have had surgery on their cervix. °· Are younger than age 17 or older than age 35. °· Are African American. °· Are pregnant with twins or multiple babies (multiple gestation). °· Take street drugs or smoke while pregnant. °· Do not gain enough weight while pregnant. °· Became pregnant shortly after having been pregnant. °What are the symptoms of preterm labor? °Symptoms of preterm labor include: °· Cramps similar to those that can happen during a menstrual period. The cramps may happen with diarrhea. °· Pain in the abdomen or lower back. °· Regular uterine contractions that may feel like tightening of the abdomen. °· A feeling of increased pressure in the pelvis. °· Increased watery or bloody mucus discharge from the vagina. °· Water breaking (ruptured amniotic sac). °Why is it important to recognize signs of preterm labor? °It is important to recognize signs of preterm labor because babies who are born prematurely may not be fully developed. This can put them at an increased risk for: °· Long-term (chronic) heart and lung problems. °· Difficulty immediately after birth with regulating body systems, including blood sugar, body temperature, heart rate, and breathing rate. °· Bleeding in the brain. °· Cerebral palsy. °· Learning difficulties. °· Death. °These risks are highest for babies who are born before 34 weeks of pregnancy. °How is preterm labor treated? °Treatment depends on the length of your pregnancy, your condition,  and the health of your baby. It may involve: °· Having a stitch (suture) placed in your cervix to prevent your cervix from opening too early (cerclage). °· Taking or being given medicines, such as: °¨ Hormone medicines. These may be given early in pregnancy to help support the pregnancy. °¨ Medicine to stop contractions. °¨ Medicines to help mature the baby’s lungs. These may be prescribed if the risk of delivery is high. °¨ Medicines to prevent your baby from developing cerebral palsy. °If the labor happens before 34 weeks of pregnancy, you may need to stay in the hospital. °What should I do if I think I am in preterm labor? °If you think that you are going into preterm labor, call your health care provider right away. °How can I prevent preterm labor in future pregnancies? °To increase your chance of having a full-term pregnancy: °· Do not use any tobacco products, such as cigarettes, chewing tobacco, and e-cigarettes. If you need help quitting, ask your health care provider. °· Do not use street drugs or medicines that have not been prescribed to you during your pregnancy. °· Talk with your health care provider before taking any herbal supplements, even if you have been taking them regularly. °· Make sure you gain a healthy amount of weight during your pregnancy. °· Watch for infection. If you think that you might have an infection, get it checked right away. °· Make sure to tell your health care provider if you have gone into preterm labor before. °This information is not intended to replace advice given to you by your health care provider. Make sure you discuss any questions you have with your   health care provider. °Document Released: 09/28/2003 Document Revised: 12/19/2015 Document Reviewed: 11/29/2015 °Elsevier Interactive Patient Education © 2017 [employer] Inc. ° °

## 2016-11-17 NOTE — MAU Note (Signed)
Pt presents to MAU with complaints of vaginal bleeding when she wipes with lower abdominal cramping. Reports good fetal movement,

## 2016-11-17 NOTE — MAU Provider Note (Signed)
History     CSN: [id number]  Arrival date and time: 11/17/16 1315   First Provider Initiated Contact with Patient 11/17/16 1343      Chief Complaint  Patient presents with  . Vaginal Bleeding  . Abdominal Cramping   HPI Robyn Willis is a 26 y.o. G3P1011 at [redacted]w[redacted]d who presents for abdominal cramping and spotting. She states the cramping started last night and is every 10 to 15 minutes. She rates her pain a 10/10 in her lower abdomen and has not treated the pain. This morning she started having spotting when she wipes. Denies recent intercourse. Is using vaginal progesterone for shortened cervix and last used last night. Also using procardia for preterm contractions but states she hasn't used "it in a while". Reports good fetal movement.    OB History    Gravida Para Term Preterm AB Living   SAB TAB Ectopic Multiple Live Births   0       1      Past Medical History:  Diagnosis Date  . Medical history non-contributory     Past Surgical History:  Procedure Laterality Date  . NO PAST SURGERIES      Family History  Problem Relation Age of Onset  . Kidney disease Mother   . Diabetes Mother   . Hypertension Mother   . Diabetes Father   . Hypertension Father   . Heart disease Maternal Grandmother   . Alzheimer's disease Paternal Grandmother     Social History  Substance Use Topics  . Smoking status: Former Games developer  . Smokeless tobacco: Never Used  . Alcohol use No    Allergies:  Allergies  Allergen Reactions  . Penicillins Hives    Has patient had a PCN reaction causing immediate rash, facial/tongue/throat swelling, SOB or lightheadedness with hypotension: No Has patient had a PCN reaction causing severe rash involving mucus membranes or skin necrosis: No Has patient had a PCN reaction that required hospitalization No Has patient had a PCN reaction occurring within the last 10 years: No If all of the above answers are "NO", then may proceed with  Cephalosporin     Prescriptions Prior to Admission  Medication Sig Dispense Refill Last Dose  . NIFEdipine (PROCARDIA-XL/ADALAT-CC/NIFEDICAL-XL) 30 MG 24 hr tablet Take 1 tablet (30 mg total) by mouth 2 (two) times daily. 60 tablet 2   . Prenat w/o A-FeCbGl-DSS-FA-DHA (CITRANATAL ASSURE) 35-1 & 300 MG tablet One tablet and one capsule daily 60 tablet 11 Taking  . progesterone (PROMETRIUM) 200 MG capsule Place 1 capsule (200 mg total) vaginally at bedtime. 30 capsule 5 Taking  . ranitidine (ZANTAC) 150 MG tablet Take 1 tablet (150 mg total) by mouth 2 (two) times daily. (Patient taking differently: Take 150 mg by mouth 2 (two) times daily as needed for heartburn. ) 60 tablet 6 Taking    Review of Systems  Constitutional: Negative.   Gastrointestinal: Positive for abdominal pain.  Genitourinary: Positive for vaginal bleeding. Negative for dysuria and vaginal pain.   Physical Exam   Blood pressure 118/83, pulse 88, temperature 98.4 F (36.9 C), resp. rate 18, height  (1.626 m), weight 118 lb (53.5 kg), last menstrual period 05/20/2016.  Physical Exam  Nursing note and vitals reviewed. Constitutional: She appears well-developed and well-nourished. She appears distressed.  HENT:  Head: Normocephalic and atraumatic.  Eyes: Conjunctivae are normal. No scleral icterus.  Respiratory: Effort normal. No respiratory distress.  GI: Soft.  No contractions palpated  Genitourinary: Cervix exhibits no friability. There is erythema in the vagina. No bleeding in the vagina. Vaginal discharge found.  Neurological: She is alert.  Skin: Skin is warm and dry.  Psychiatric: She has a normal mood and affect. Her behavior is normal. Judgment and thought content normal.  Dilation: Closed Exam by:: Antony Odea CNM Student   Fetal Tracing:  Baseline: 150 Variability: moderate Accelerations: 10x10  Decelerations: none  Toco: none    MAU Course  Procedures Results for orders placed or  performed during the hospital encounter of 11/17/16 (from the past 24 hour(s))  Urinalysis, Routine w reflex microscopic     Status: Abnormal   Collection Time: 11/17/16  1:23 PM  Result Value Ref Range   Color, Urine YELLOW YELLOW   APPearance CLOUDY (A) CLEAR   Specific Gravity, Urine 1.016 1.005 - 1.030   pH 6.0 5.0 - 8.0   Glucose, UA NEGATIVE NEGATIVE mg/dL   Hgb urine dipstick NEGATIVE NEGATIVE   Bilirubin Urine NEGATIVE NEGATIVE   Ketones, ur 20 (A) NEGATIVE mg/dL   Protein, ur NEGATIVE NEGATIVE mg/dL   Nitrite NEGATIVE NEGATIVE   Leukocytes, UA MODERATE (A) NEGATIVE   RBC / HPF 0-5 0 - 5 RBC/hpf   WBC, UA TOO NUMEROUS TO COUNT 0 - 5 WBC/hpf   Bacteria, UA RARE (A) NONE SEEN   Squamous Epithelial / LPF 6-30 (A) NONE SEEN   Mucous PRESENT    MDM Cervix closed. Will order cervical length.  Ultrasound shows cervical length comparable to previous study. Funnels 2.5 to 0.8cm Given dose of procardia and patient reports feeling better.  Fetal tracing appropriate for gestational age.  Contractions not palpated or noted on Toco.   Assessment and Plan   1. [redacted] weeks gestation of pregnancy   2. Short cervical length during pregnancy in second trimester   3. Abdominal cramping affecting pregnancy     Discharge home. Discussed when to return to MAU. Continue taking procardia and vaginal progesterone as prescribed. Keep follow up appointment on May 3rd at [facility].  Cleone Slim SNM 11/17/2016, 1:59 PM

## 2016-11-18 LAB — CULTURE, OB URINE: Culture: NO GROWTH

## 2016-11-21 ENCOUNTER — Ambulatory Visit (INDEPENDENT_AMBULATORY_CARE_PROVIDER_SITE_OTHER): Payer: Medicaid Other | Admitting: Women's Health

## 2016-11-21 ENCOUNTER — Encounter: Payer: Self-pay | Admitting: Women's Health

## 2016-11-21 VITALS — BP 112/60 | HR 77 | Wt 120.0 lb

## 2016-11-21 DIAGNOSIS — Z1389 Encounter for screening for other disorder: Secondary | ICD-10-CM | POA: Diagnosis not present

## 2016-11-21 DIAGNOSIS — O350XX0 Maternal care for (suspected) central nervous system malformation in fetus, not applicable or unspecified: Secondary | ICD-10-CM

## 2016-11-21 DIAGNOSIS — O0992 Supervision of high risk pregnancy, unspecified, second trimester: Secondary | ICD-10-CM

## 2016-11-21 DIAGNOSIS — Z331 Pregnant state, incidental: Secondary | ICD-10-CM | POA: Diagnosis not present

## 2016-11-21 DIAGNOSIS — O26872 Cervical shortening, second trimester: Secondary | ICD-10-CM

## 2016-11-21 DIAGNOSIS — O26879 Cervical shortening, unspecified trimester: Secondary | ICD-10-CM

## 2016-11-21 DIAGNOSIS — O0990 Supervision of high risk pregnancy, unspecified, unspecified trimester: Secondary | ICD-10-CM

## 2016-11-21 DIAGNOSIS — IMO0001 Reserved for inherently not codable concepts without codable children: Secondary | ICD-10-CM

## 2016-11-21 LAB — POCT URINALYSIS DIPSTICK
Blood, UA: NEGATIVE
Glucose, UA: NEGATIVE
KETONES UA: NEGATIVE
LEUKOCYTES UA: NEGATIVE
Nitrite, UA: NEGATIVE

## 2016-11-21 NOTE — Patient Instructions (Addendum)
You will have your sugar test next visit.  Please do not eat or drink anything after midnight the night before you come, not even water.  You will be here for at least two hours.     Call the office [phone]) or go to [facility] if:  You begin to have strong, frequent contractions  Your water breaks.  Sometimes it is a big gush of fluid, sometimes it is just a trickle that keeps getting your panties wet or running down your legs  You have vaginal bleeding.  It is normal to have a small amount of spotting if your cervix was checked.   You don't feel your baby moving like normal.  If you don't, get you something to eat and drink and lay down and focus on feeling your baby move.  You should feel at least 10 movements in 2 hours.  If you don't, you should call the office or go to [facility].   [place] Pediatricians/Family Doctors:  [facility] [phone]            [facility] [phone]                 [facility] [phone] (usually not accepting new patients unless you have family there already, you are always welcome to call and ask)            [facility]/Family Doctors:   [facility]: [phone]  [facility]: [phone]      Preterm Labor and Birth Information The normal length of a pregnancy is 39-41 weeks. Preterm labor is when labor starts before 37 completed weeks of pregnancy. What are the risk factors for preterm labor? Preterm labor is more likely to occur in women who:  Have certain infections during pregnancy such as a bladder infection, sexually transmitted infection, or infection inside the uterus (chorioamnionitis).  Have a shorter-than-normal cervix.  Have gone into preterm labor before.  Have had surgery on their cervix.  Are younger than age 24 or older than age 24.  Are African American.  Are pregnant with twins or multiple babies (multiple  gestation).  Take street drugs or smoke while pregnant.  Do not gain enough weight while pregnant.  Became pregnant shortly after having been pregnant. What are the symptoms of preterm labor? Symptoms of preterm labor include:  Cramps similar to those that can happen during a menstrual period. The cramps may happen with diarrhea.  Pain in the abdomen or lower back.  Regular uterine contractions that may feel like tightening of the abdomen.  A feeling of increased pressure in the pelvis.  Increased watery or bloody mucus discharge from the vagina.  Water breaking (ruptured amniotic sac). Why is it important to recognize signs of preterm labor? It is important to recognize signs of preterm labor because babies who are born prematurely may not be fully developed. This can put them at an increased risk for:  Long-term (chronic) heart and lung problems.  Difficulty immediately after birth with regulating body systems, including blood sugar, body temperature, heart rate, and breathing rate.  Bleeding in the brain.  Cerebral palsy.  Learning difficulties.  Death. These risks are highest for babies who are born before 34 weeks of pregnancy. How is preterm labor treated? Treatment depends on the length of your pregnancy, your condition, and the health of your baby. It may involve:  Having a stitch (suture) placed in your cervix to prevent your cervix from opening too early (cerclage).  Taking or being given medicines, such as:  Hormone medicines. These may be given early in pregnancy to help support the pregnancy.  Medicine to stop contractions.  Medicines to help mature the baby's lungs. These may be prescribed if the risk of delivery is high.  Medicines to prevent your baby from developing cerebral palsy. If the labor happens before 34 weeks of pregnancy, you may need to stay in the hospital. What should I do if I think I am in preterm labor? If you think that you are  going into preterm labor, call your health care provider right away. How can I prevent preterm labor in future pregnancies? To increase your chance of having a full-term pregnancy:  Do not use any tobacco products, such as cigarettes, chewing tobacco, and e-cigarettes. If you need help quitting, ask your health care provider.  Do not use street drugs or medicines that have not been prescribed to you during your pregnancy.  Talk with your health care provider before taking any herbal supplements, even if you have been taking them regularly.  Make sure you gain a healthy amount of weight during your pregnancy.  Watch for infection. If you think that you might have an infection, get it checked right away.  Make sure to tell your health care provider if you have gone into preterm labor before. This information is not intended to replace advice given to you by your health care provider. Make sure you discuss any questions you have with your health care provider. Document Released: 09/28/2003 Document Revised: 12/19/2015 Document Reviewed: 11/29/2015 Elsevier Interactive Patient Education  2017 [employer].

## 2016-11-21 NOTE — Progress Notes (Signed)
High Risk Pregnancy Diagnosis(es): short cx G3P1011 4038w3d Estimated Date of Delivery: 02/24/17 BP 112/60   Pulse 77   Wt 120 lb (54.4 kg)   LMP 05/20/2016 (Approximate)   BMI 20.60 kg/m   Urinalysis: Positive for tr protein HPI:  Doing well. [facility] w/ spotting/cramping. U/S c/w dynamic cx 25-418mm w/ funneling. Cx was closed on exam. Still taking nightly progesterone and procardia 30xl bid.  BP, weight, and urine reviewed.  Reports good fm. Denies regular uc's, lof, vb, uti s/s. No complaints.  Fundal Height:  24 Fetal Heart rate:  160 Edema:  none  Reviewed ptl s/s, fkc. Discussed weight gain, has only gained 5lb of recommended 25-35lbs total based on her pre-gravid BMI. Sometimes doesn't have much of an appetite. Can try boost/ensure.  All questions were answered Assessment: 5638w3d short dynamic cx Medication(s) Plans:  Continue prometrium, procardia Treatment Plan:  u/s next visit to recheck CP cyst, efw/afi and look at cervix Follow up in 2wks for high-risk OB appt and f/u u/s

## 2016-12-06 ENCOUNTER — Ambulatory Visit (INDEPENDENT_AMBULATORY_CARE_PROVIDER_SITE_OTHER): Payer: Medicaid Other

## 2016-12-06 ENCOUNTER — Other Ambulatory Visit: Payer: Self-pay | Admitting: Women's Health

## 2016-12-06 ENCOUNTER — Encounter: Payer: Self-pay | Admitting: Obstetrics and Gynecology

## 2016-12-06 ENCOUNTER — Other Ambulatory Visit: Payer: Medicaid Other

## 2016-12-06 ENCOUNTER — Ambulatory Visit (INDEPENDENT_AMBULATORY_CARE_PROVIDER_SITE_OTHER): Payer: Medicaid Other | Admitting: Obstetrics and Gynecology

## 2016-12-06 VITALS — BP 106/60 | HR 81 | Wt 123.8 lb

## 2016-12-06 DIAGNOSIS — O0993 Supervision of high risk pregnancy, unspecified, third trimester: Secondary | ICD-10-CM

## 2016-12-06 DIAGNOSIS — O26873 Cervical shortening, third trimester: Secondary | ICD-10-CM

## 2016-12-06 DIAGNOSIS — O26879 Cervical shortening, unspecified trimester: Secondary | ICD-10-CM

## 2016-12-06 DIAGNOSIS — Z3A28 28 weeks gestation of pregnancy: Secondary | ICD-10-CM

## 2016-12-06 DIAGNOSIS — Z331 Pregnant state, incidental: Secondary | ICD-10-CM | POA: Diagnosis not present

## 2016-12-06 DIAGNOSIS — IMO0001 Reserved for inherently not codable concepts without codable children: Secondary | ICD-10-CM

## 2016-12-06 DIAGNOSIS — Z1389 Encounter for screening for other disorder: Secondary | ICD-10-CM | POA: Diagnosis not present

## 2016-12-06 DIAGNOSIS — O0990 Supervision of high risk pregnancy, unspecified, unspecified trimester: Secondary | ICD-10-CM

## 2016-12-06 DIAGNOSIS — Z131 Encounter for screening for diabetes mellitus: Secondary | ICD-10-CM

## 2016-12-06 DIAGNOSIS — O350XX0 Maternal care for (suspected) central nervous system malformation in fetus, not applicable or unspecified: Secondary | ICD-10-CM | POA: Diagnosis not present

## 2016-12-06 LAB — POCT URINALYSIS DIPSTICK
Blood, UA: NEGATIVE
Glucose, UA: 500
KETONES UA: NEGATIVE
NITRITE UA: NEGATIVE
PROTEIN UA: NEGATIVE

## 2016-12-06 NOTE — Progress Notes (Signed)
US 28+4 wks,cephalic,dynamic shortened cx 1.6-1.02.1-1.6 cm,normal ovaries bilat,resolved choroid plexus cyst,post pl gr 1,AFI 9.8 cm,EFW 1085 g 34%

## 2016-12-06 NOTE — Progress Notes (Signed)
Robyn Willis is a 26 y.o. female  High Risk Pregnancy HROB Diagnosis(es): short cervix  G3P1011 7372w4d Estimated Date of Delivery: 02/24/17 BP 106/60   Pulse 81   Wt 123 lb 12.8 oz (56.2 kg)   LMP 05/20/2016 (Approximate)   BMI 21.25 kg/m      HPI: The patient is being seen today for ongoing management of the above.Today she reports mild mucous like discharge; no watery discharge seen. No other acute concerns at this time. Patient reports good fetal movement; denies any bleeding and no rupture of membranes symptoms or regular contractions.   BP weight and urine results reviewed and noted. Blood pressure 106/60, pulse 81, weight 123 lb 12.8 oz (56.2 kg), last menstrual period 05/20/2016.  Fundal Height:  26cm Fetal Heart rate:  162 bpm Physical Examination: Abdomen - soft, nontender, nondistended, no masses or organomegaly                                     Pelvic - Cervix 1.6 cm in length per transvaginal US.   Urinalysis: POSITIVE for moderate leukocytes  Fetal Surveillance Testing today:  Transabdominal and transvaginal US                        Cervix stable at 1.6 cm Lab and sonogram results have been reviewed.  Assessment:  1.  Pregnancy at 7372w4d,  G3P1011                          2.  Short cervix  , stable       Medication(s) Plans:  Continue prometrium, procardia  Treatment Plan: Follow up in 2 weeks for appointment for high risk OB care  By signing my name below, I, Freida Busmaniana Omoyeni, attest that this documentation has been prepared under the direction and in the presence of Tilda BurrowJohn V Denilson Salminen, MD . Electronically Signed: Freida Busmaniana Omoyeni, Scribe. 12/06/2016. 11:06 AM. I personally performed the services described in this documentation, which was SCRIBED in my presence. The recorded information has been reviewed and considered accurate. It has been edited as necessary during review. Tilda BurrowFERGUSON,Zakkary Thibault V, MD

## 2016-12-07 LAB — CBC
HEMOGLOBIN: 12 g/dL (ref 11.1–15.9)
Hematocrit: 34.3 % (ref 34.0–46.6)
MCH: 32 pg (ref 26.6–33.0)
MCHC: 35 g/dL (ref 31.5–35.7)
MCV: 92 fL (ref 79–97)
PLATELETS: 203 10*3/uL (ref 150–379)
RBC: 3.75 x10E6/uL — AB (ref 3.77–5.28)
RDW: 12.7 % (ref 12.3–15.4)
WBC: 12.7 10*3/uL — ABNORMAL HIGH (ref 3.4–10.8)

## 2016-12-07 LAB — GLUCOSE TOLERANCE, 2 HOURS W/ 1HR
Glucose, 1 hour: 145 mg/dL (ref 65–179)
Glucose, 2 hour: 131 mg/dL (ref 65–152)
Glucose, Fasting: 83 mg/dL (ref 65–91)

## 2016-12-07 LAB — RPR: RPR Ser Ql: NONREACTIVE

## 2016-12-07 LAB — HIV ANTIBODY (ROUTINE TESTING W REFLEX): HIV SCREEN 4TH GENERATION: NONREACTIVE

## 2016-12-07 LAB — ANTIBODY SCREEN: Antibody Screen: NEGATIVE

## 2016-12-20 ENCOUNTER — Ambulatory Visit (INDEPENDENT_AMBULATORY_CARE_PROVIDER_SITE_OTHER): Payer: Medicaid Other | Admitting: Women's Health

## 2016-12-20 ENCOUNTER — Encounter: Payer: Self-pay | Admitting: Women's Health

## 2016-12-20 VITALS — BP 114/60 | HR 96 | Wt 124.0 lb

## 2016-12-20 DIAGNOSIS — Z1389 Encounter for screening for other disorder: Secondary | ICD-10-CM

## 2016-12-20 DIAGNOSIS — O0993 Supervision of high risk pregnancy, unspecified, third trimester: Secondary | ICD-10-CM | POA: Diagnosis not present

## 2016-12-20 DIAGNOSIS — Z331 Pregnant state, incidental: Secondary | ICD-10-CM | POA: Diagnosis not present

## 2016-12-20 DIAGNOSIS — O26879 Cervical shortening, unspecified trimester: Secondary | ICD-10-CM

## 2016-12-20 LAB — POCT URINALYSIS DIPSTICK
Blood, UA: NEGATIVE
Glucose, UA: NEGATIVE
Ketones, UA: NEGATIVE
NITRITE UA: NEGATIVE
PROTEIN UA: NEGATIVE

## 2016-12-20 NOTE — Progress Notes (Addendum)
High Risk Pregnancy Diagnosis(es): short cx G3P1011 828w4d Estimated Date of Delivery: 02/24/17 BP 114/60   Pulse 96   Wt 124 lb (56.2 kg)   LMP 05/20/2016 (Approximate)   BMI 21.28 kg/m   Urinalysis: Positive for small leuks HPI:  Doing well BP, weight, and urine reviewed.  Reports good fm. Denies regular uc's, lof, vb, uti s/s. No complaints.  Fundal Height:  28 Fetal Heart rate:  145 Edema:  none  Reviewed ptl s/s, normal pn2 results, fkc. Recommended Tdap at HD/PCP per CDC guidelines.  All questions were answered Assessment: 8828w4d short cx Medication(s) Plans:  Continue prometrium and procardia Treatment Plan:  q 2wk visits for now Follow up in 2wks for high-risk OB appt

## 2016-12-20 NOTE — Patient Instructions (Addendum)
Call the office ([phone]) or go to [facility] if:  You begin to have strong, frequent contractions  Your water breaks.  Sometimes it is a big gush of fluid, sometimes it is just a trickle that keeps getting your panties wet or running down your legs  You have vaginal bleeding.  It is normal to have a small amount of spotting if your cervix was checked.   You don't feel your baby moving like normal.  If you don't, get you something to eat and drink and lay down and focus on feeling your baby move.  You should feel at least 10 movements in 2 hours.  If you don't, you should call the office or go to [facility].    Tdap Vaccine  It is recommended that you get the Tdap vaccine during the third trimester of EACH pregnancy to help protect your baby from getting pertussis (whooping cough)  27-36 weeks is the BEST time to do this so that you can pass the protection on to your baby. During pregnancy is better than after pregnancy, but if you are unable to get it during pregnancy it will be offered at the hospital.   You can get this vaccine at the health department or your family doctor  Everyone who will be around your baby should also be up-to-date on their vaccines. Adults (who are not pregnant) only need 1 dose of Tdap during adulthood.     Preterm Labor and Birth Information The normal length of a pregnancy is 39-41 weeks. Preterm labor is when labor starts before 37 completed weeks of pregnancy. What are the risk factors for preterm labor? Preterm labor is more likely to occur in women who:  Have certain infections during pregnancy such as a bladder infection, sexually transmitted infection, or infection inside the uterus (chorioamnionitis).  Have a shorter-than-normal cervix.  Have gone into preterm labor before.  Have had surgery on their cervix.  Are younger than age 17 or older than age 35.  Are African American.  Are pregnant with twins or multiple babies (multiple  gestation).  Take street drugs or smoke while pregnant.  Do not gain enough weight while pregnant.  Became pregnant shortly after having been pregnant.  What are the symptoms of preterm labor? Symptoms of preterm labor include:  Cramps similar to those that can happen during a menstrual period. The cramps may happen with diarrhea.  Pain in the abdomen or lower back.  Regular uterine contractions that may feel like tightening of the abdomen.  A feeling of increased pressure in the pelvis.  Increased watery or bloody mucus discharge from the vagina.  Water breaking (ruptured amniotic sac).  Why is it important to recognize signs of preterm labor? It is important to recognize signs of preterm labor because babies who are born prematurely may not be fully developed. This can put them at an increased risk for:  Long-term (chronic) heart and lung problems.  Difficulty immediately after birth with regulating body systems, including blood sugar, body temperature, heart rate, and breathing rate.  Bleeding in the brain.  Cerebral palsy.  Learning difficulties.  Death.  These risks are highest for babies who are born before 34 weeks of pregnancy. How is preterm labor treated? Treatment depends on the length of your pregnancy, your condition, and the health of your baby. It may involve:  Having a stitch (suture) placed in your cervix to prevent your cervix from opening too early (cerclage).  Taking or being given medicines,   such as: ? Hormone medicines. These may be given early in pregnancy to help support the pregnancy. ? Medicine to stop contractions. ? Medicines to help mature the baby's lungs. These may be prescribed if the risk of delivery is high. ? Medicines to prevent your baby from developing cerebral palsy.  If the labor happens before 34 weeks of pregnancy, you may need to stay in the hospital. What should I do if I think I am in preterm labor? If you think that you  are going into preterm labor, call your health care provider right away. How can I prevent preterm labor in future pregnancies? To increase your chance of having a full-term pregnancy:  Do not use any tobacco products, such as cigarettes, chewing tobacco, and e-cigarettes. If you need help quitting, ask your health care provider.  Do not use street drugs or medicines that have not been prescribed to you during your pregnancy.  Talk with your health care provider before taking any herbal supplements, even if you have been taking them regularly.  Make sure you gain a healthy amount of weight during your pregnancy.  Watch for infection. If you think that you might have an infection, get it checked right away.  Make sure to tell your health care provider if you have gone into preterm labor before.  This information is not intended to replace advice given to you by your health care provider. Make sure you discuss any questions you have with your health care provider. Document Released: 09/28/2003 Document Revised: 12/19/2015 Document Reviewed: 11/29/2015 Elsevier Interactive Patient Education  2018 [employer]  

## 2016-12-23 ENCOUNTER — Telehealth: Payer: Self-pay | Admitting: *Deleted

## 2016-12-23 NOTE — Telephone Encounter (Signed)
Informed patient that per Selena BattenKim, patient needs to be seen. Verbalized understanding. Will schedule tomorrow.

## 2016-12-23 NOTE — Telephone Encounter (Signed)
Patient called stating she thinks she has BV again, excessive discharge and foul odor. She has some metrogel left but needs a refill. Please advise.

## 2016-12-24 ENCOUNTER — Encounter: Payer: Self-pay | Admitting: Advanced Practice Midwife

## 2016-12-24 ENCOUNTER — Ambulatory Visit (INDEPENDENT_AMBULATORY_CARE_PROVIDER_SITE_OTHER): Payer: Medicaid Other | Admitting: Advanced Practice Midwife

## 2016-12-24 VITALS — BP 116/68 | HR 95 | Wt 125.0 lb

## 2016-12-24 DIAGNOSIS — N760 Acute vaginitis: Secondary | ICD-10-CM

## 2016-12-24 DIAGNOSIS — Z1389 Encounter for screening for other disorder: Secondary | ICD-10-CM

## 2016-12-24 DIAGNOSIS — B9689 Other specified bacterial agents as the cause of diseases classified elsewhere: Secondary | ICD-10-CM

## 2016-12-24 DIAGNOSIS — Z331 Pregnant state, incidental: Secondary | ICD-10-CM

## 2016-12-24 MED ORDER — CLINDAMYCIN HCL 300 MG PO CAPS: 300.0000 mg | ORAL_CAPSULE | Freq: Three times a day (TID) | ORAL | 0 refills | Status: DC

## 2016-12-24 NOTE — Patient Instructions (Signed)
Bacterial Vaginosis Bacterial vaginosis is a vaginal infection that occurs when the normal balance of bacteria in the vagina is disrupted. It results from an overgrowth of certain bacteria. This is the most common vaginal infection among women ages 15-44. Because bacterial vaginosis increases your risk for STIs (sexually transmitted infections), getting treated can help reduce your risk for chlamydia, gonorrhea, herpes, and HIV (human immunodeficiency virus). Treatment is also important for preventing complications in pregnant women, because this condition can cause an early (premature) delivery. What are the causes? This condition is caused by an increase in harmful bacteria that are normally present in small amounts in the vagina. However, the reason that the condition develops is not fully understood. What increases the risk? The following factors may make you more likely to develop this condition:  Having a new sexual partner or multiple sexual partners.  Having unprotected sex.  Douching.  Having an intrauterine device (IUD).  Smoking.  Drug and alcohol abuse.  Taking certain antibiotic medicines.  Being pregnant.  You cannot get bacterial vaginosis from toilet seats, bedding, swimming pools, or contact with objects around you. What are the signs or symptoms? Symptoms of this condition include:  Grey or white vaginal discharge. The discharge can also be watery or foamy.  A fish-like odor with discharge, especially after sexual intercourse or during menstruation.  Itching in and around the vagina.  Burning or pain with urination.  Some women with bacterial vaginosis have no signs or symptoms. How is this diagnosed? This condition is diagnosed based on:  Your medical history.  A physical exam of the vagina.  Testing a sample of vaginal fluid under a microscope to look for a large amount of bad bacteria or abnormal cells. Your health care provider may use a cotton swab  or a small wooden spatula to collect the sample.  How is this treated? This condition is treated with antibiotics. These may be given as a pill, a vaginal cream, or a medicine that is put into the vagina (suppository). If the condition comes back after treatment, a second round of antibiotics may be needed. Follow these instructions at home: Medicines  Take over-the-counter and prescription medicines only as told by your health care provider.  Take or use your antibiotic as told by your health care provider. Do not stop taking or using the antibiotic even if you start to feel better. General instructions  If you have a female sexual partner, tell her that you have a vaginal infection. She should see her health care provider and be treated if she has symptoms. If you have a female sexual partner, he does not need treatment.  During treatment: ? Avoid sexual activity until you finish treatment. ? Do not douche. ? Avoid alcohol as directed by your health care provider. ? Avoid breastfeeding as directed by your health care provider.  Drink enough water and fluids to keep your urine clear or pale yellow.  Keep the area around your vagina and rectum clean. ? Wash the area daily with warm water. ? Wipe yourself from front to back after using the toilet.  Keep all follow-up visits as told by your health care provider. This is important. How is this prevented?  Do not douche.  Wash the outside of your vagina with warm water only.  Use protection when having sex. This includes latex condoms and dental dams.  Limit how many sexual partners you have. To help prevent bacterial vaginosis, it is best to have sex with just   one partner (monogamous).  Make sure you and your sexual partner are tested for STIs.  Wear cotton or cotton-lined underwear.  Avoid wearing tight pants and pantyhose, especially during summer.  Limit the amount of alcohol that you drink.  Do not use any products that  contain nicotine or tobacco, such as cigarettes and e-cigarettes. If you need help quitting, ask your health care provider.  Do not use illegal drugs. Where to find more information:  Centers for Disease Control and Prevention: www.cdc.gov/std  American Sexual Health Association (ASHA): www.ashastd.org  [place] Department of Health and Human Services, Office on Women's Health: www.womenshealth.gov/ or https://www.womenshealth.gov/a-z-topics/bacterial-vaginosis Contact a health care provider if:  Your symptoms do not improve, even after treatment.  You have more discharge or pain when urinating.  You have a fever.  You have pain in your abdomen.  You have pain during sex.  You have vaginal bleeding between periods. Summary  Bacterial vaginosis is a vaginal infection that occurs when the normal balance of bacteria in the vagina is disrupted.  Because bacterial vaginosis increases your risk for STIs (sexually transmitted infections), getting treated can help reduce your risk for chlamydia, gonorrhea, herpes, and HIV (human immunodeficiency virus). Treatment is also important for preventing complications in pregnant women, because the condition can cause an early (premature) delivery.  This condition is treated with antibiotic medicines. These may be given as a pill, a vaginal cream, or a medicine that is put into the vagina (suppository). This information is not intended to replace advice given to you by your health care provider. Make sure you discuss any questions you have with your health care provider. Document Released: 07/08/2005 Document Revised: 03/23/2016 Document Reviewed: 03/23/2016 Elsevier Interactive Patient Education  2017 Elsevier Inc.  

## 2016-12-24 NOTE — Progress Notes (Signed)
WORK IN FOR VAGINAL DISHCARGE CHIEF COMPLAINT/HPI:  26 y.o. female complains of white, frothy and thin vaginal discharge for a few days. . Denies abnormal vaginal bleeding, significant pelvic pain or fever.  no itch, no irritation, fishy odor. No UTI symptoms.   Denies history of known exposure to STD or symptoms in partner.  Patient's last menstrual period was 05/20/2016 (approximate).  Has not` tried an OTC product.   Review of Systems  Constitutional: Negative for fever and chills Eyes: Negative for visual disturbances Respiratory: Negative for shortness of breath, dyspnea Cardiovascular: Negative for chest pain or palpitations  Gastrointestinal: Negative for vomiting, diarrhea and constipation Genitourinary: Negative for dysuria and urgency Musculoskeletal: Negative for back pain, joint pain, myalgias  Neurological: Negative for dizziness and headaches    Past Medical History: Past Medical History:  Diagnosis Date  . Medical history non-contributory     Past Surgical History: Past Surgical History:  Procedure Laterality Date  . NO PAST SURGERIES      Obstetrical History: OB History    Gravida Para Term Preterm AB Living   3 1 1   1 1    SAB TAB Ectopic Multiple Live Births   0       1      Social History: Social History   Social History  . Marital status: Single    Spouse name: N/A  . Number of children: N/A  . Years of education: N/A   Social History Main Topics  . Smoking status: Former Games developermoker  . Smokeless tobacco: Never Used  . Alcohol use No  . Drug use: No  . Sexual activity: Not Currently    Birth control/ protection: None   Other Topics Concern  . None   Social History Narrative  . None    Family History: Family History  Problem Relation Age of Onset  . Kidney disease Mother   . Diabetes Mother   . Hypertension Mother   . Diabetes Father   . Hypertension Father   . Heart disease Maternal Grandmother   . Alzheimer's disease Paternal  Grandmother     Allergies: Allergies  Allergen Reactions  . Penicillins Hives    Has patient had a PCN reaction causing immediate rash, facial/tongue/throat swelling, SOB or lightheadedness with hypotension: No Has patient had a PCN reaction causing severe rash involving mucus membranes or skin necrosis: No Has patient had a PCN reaction that required hospitalization No Has patient had a PCN reaction occurring within the last 10 years: No If all of the above answers are "NO", then may proceed with Cephalosporin         PHYSICAL EXAM: Physical Examination: General appearance - well appearing, and in no distress Mental status - alert, oriented to person, place, and time Chest:  Normal respiratory effort Heart - normal rate and regular rhythm Abdomen:  Soft, nontender Pelvic: SSE:  Thin white discharge, wet prep neg trich, pos clue, pos wbc, no yeast Musculoskeletal:  Normal range of motion without pain Extremities:  No edema    Labs: No results found for this or any previous visit (from the past 24 hour(s)).   Assessment: BV  Plan:  Orders Placed This Encounter  Procedures  . POCT urinalysis dipstick  (pt can't tolerate flagyl and avoiding vag cream d/t short cx) cLINDAMYCIN   CRESENZO-DISHMAN,Charda Janis 12/24/16 2:24 PM

## 2016-12-31 ENCOUNTER — Telehealth: Payer: Self-pay | Admitting: Women's Health

## 2016-12-31 NOTE — Telephone Encounter (Signed)
Patient states she is spotting and cramping that just started about 15 minutes ago. She is currently resting on her side and is drinking gatorade. She has not taken a procardia as patient states "I don't want to take one until I can't take it anymore". Advised patient to take a Procardia as prescribed and not wait until the pain gets worse. [facility] after [facility] bleeding continues or pain continues or gets worse [facility] Verbalized understanding.

## 2017-01-03 ENCOUNTER — Ambulatory Visit (INDEPENDENT_AMBULATORY_CARE_PROVIDER_SITE_OTHER): Payer: Medicaid Other | Admitting: Women's Health

## 2017-01-03 ENCOUNTER — Encounter: Payer: Self-pay | Admitting: Women's Health

## 2017-01-03 VITALS — BP 100/60 | HR 78 | Wt 127.0 lb

## 2017-01-03 DIAGNOSIS — O0993 Supervision of high risk pregnancy, unspecified, third trimester: Secondary | ICD-10-CM | POA: Diagnosis not present

## 2017-01-03 DIAGNOSIS — O0990 Supervision of high risk pregnancy, unspecified, unspecified trimester: Secondary | ICD-10-CM | POA: Diagnosis not present

## 2017-01-03 DIAGNOSIS — Z1389 Encounter for screening for other disorder: Secondary | ICD-10-CM

## 2017-01-03 DIAGNOSIS — O26843 Uterine size-date discrepancy, third trimester: Secondary | ICD-10-CM

## 2017-01-03 DIAGNOSIS — Z331 Pregnant state, incidental: Secondary | ICD-10-CM

## 2017-01-03 DIAGNOSIS — O26879 Cervical shortening, unspecified trimester: Secondary | ICD-10-CM | POA: Diagnosis not present

## 2017-01-03 DIAGNOSIS — O368130 Decreased fetal movements, third trimester, not applicable or unspecified: Secondary | ICD-10-CM | POA: Diagnosis not present

## 2017-01-03 DIAGNOSIS — F1290 Cannabis use, unspecified, uncomplicated: Secondary | ICD-10-CM

## 2017-01-03 LAB — POCT URINALYSIS DIPSTICK
Blood, UA: NEGATIVE
Ketones, UA: NEGATIVE
LEUKOCYTES UA: NEGATIVE
Nitrite, UA: NEGATIVE

## 2017-01-03 NOTE — Progress Notes (Signed)
High Risk Pregnancy Diagnosis(es): Short/dynamic cx G3P1011 6742w4d Estimated Date of Delivery: 02/24/17 BP 100/60   Pulse 78   Wt 127 lb (57.6 kg)   LMP 05/20/2016 (Approximate)   BMI 21.80 kg/m   Urinalysis: Positive for tr protein HPI:  No fm earlier today until about 11, feels like belly is smaller than what it should be BP, weight, and urine reviewed.  Reports good fm. Denies regular uc's, lof, vb, uti s/s.   Fundal Height:  28, s<d Fetal Heart rate:  155, reactive NST, much fm since being on EFM Edema:  none  Reviewed ptl s/s, fkc All questions were answered Assessment: 3942w4d short/dynamic cx, s<d Medication(s) Plans:  Continue prometrium and procardia Treatment Plan:  Efw/afi u/s next week for s<d- can look at cx at same time Follow up in next week for high-risk OB appt and efw/afi u/s

## 2017-01-03 NOTE — Patient Instructions (Signed)
Call the office ([phone]) or go to [facility] if:  You begin to have strong, frequent contractions  Your water breaks.  Sometimes it is a big gush of fluid, sometimes it is just a trickle that keeps getting your panties wet or running down your legs  You have vaginal bleeding.  It is normal to have a small amount of spotting if your cervix was checked.   You don't feel your baby moving like normal.  If you don't, get you something to eat and drink and lay down and focus on feeling your baby move.  You should feel at least 10 movements in 2 hours.  If you don't, you should call the office or go to [facility].     Preterm Labor and Birth Information The normal length of a pregnancy is 39-41 weeks. Preterm labor is when labor starts before 37 completed weeks of pregnancy. What are the risk factors for preterm labor? Preterm labor is more likely to occur in women who:  Have certain infections during pregnancy such as a bladder infection, sexually transmitted infection, or infection inside the uterus (chorioamnionitis).  Have a shorter-than-normal cervix.  Have gone into preterm labor before.  Have had surgery on their cervix.  Are younger than age 17 or older than age 35.  Are African American.  Are pregnant with twins or multiple babies (multiple gestation).  Take street drugs or smoke while pregnant.  Do not gain enough weight while pregnant.  Became pregnant shortly after having been pregnant.  What are the symptoms of preterm labor? Symptoms of preterm labor include:  Cramps similar to those that can happen during a menstrual period. The cramps may happen with diarrhea.  Pain in the abdomen or lower back.  Regular uterine contractions that may feel like tightening of the abdomen.  A feeling of increased pressure in the pelvis.  Increased watery or bloody mucus discharge from the vagina.  Water breaking (ruptured amniotic sac).  Why is it important to  recognize signs of preterm labor? It is important to recognize signs of preterm labor because babies who are born prematurely may not be fully developed. This can put them at an increased risk for:  Long-term (chronic) heart and lung problems.  Difficulty immediately after birth with regulating body systems, including blood sugar, body temperature, heart rate, and breathing rate.  Bleeding in the brain.  Cerebral palsy.  Learning difficulties.  Death.  These risks are highest for babies who are born before 34 weeks of pregnancy. How is preterm labor treated? Treatment depends on the length of your pregnancy, your condition, and the health of your baby. It may involve:  Having a stitch (suture) placed in your cervix to prevent your cervix from opening too early (cerclage).  Taking or being given medicines, such as: ? Hormone medicines. These may be given early in pregnancy to help support the pregnancy. ? Medicine to stop contractions. ? Medicines to help mature the baby's lungs. These may be prescribed if the risk of delivery is high. ? Medicines to prevent your baby from developing cerebral palsy.  If the labor happens before 34 weeks of pregnancy, you may need to stay in the hospital. What should I do if I think I am in preterm labor? If you think that you are going into preterm labor, call your health care provider right away. How can I prevent preterm labor in future pregnancies? To increase your chance of having a full-term pregnancy:  Do not use   any tobacco products, such as cigarettes, chewing tobacco, and e-cigarettes. If you need help quitting, ask your health care provider.  Do not use street drugs or medicines that have not been prescribed to you during your pregnancy.  Talk with your health care provider before taking any herbal supplements, even if you have been taking them regularly.  Make sure you gain a healthy amount of weight during your pregnancy.  Watch  for infection. If you think that you might have an infection, get it checked right away.  Make sure to tell your health care provider if you have gone into preterm labor before.  This information is not intended to replace advice given to you by your health care provider. Make sure you discuss any questions you have with your health care provider. Document Released: 09/28/2003 Document Revised: 12/19/2015 Document Reviewed: 11/29/2015 Elsevier Interactive Patient Education  2018 Elsevier Inc.  

## 2017-01-06 ENCOUNTER — Telehealth: Payer: Self-pay | Admitting: Obstetrics and Gynecology

## 2017-01-07 ENCOUNTER — Other Ambulatory Visit: Payer: Self-pay | Admitting: Women's Health

## 2017-01-07 ENCOUNTER — Encounter: Payer: Self-pay | Admitting: Advanced Practice Midwife

## 2017-01-07 ENCOUNTER — Ambulatory Visit (INDEPENDENT_AMBULATORY_CARE_PROVIDER_SITE_OTHER): Payer: Medicaid Other

## 2017-01-07 ENCOUNTER — Ambulatory Visit (INDEPENDENT_AMBULATORY_CARE_PROVIDER_SITE_OTHER): Payer: Medicaid Other | Admitting: Advanced Practice Midwife

## 2017-01-07 VITALS — BP 98/60 | HR 82 | Wt 128.5 lb

## 2017-01-07 DIAGNOSIS — O26843 Uterine size-date discrepancy, third trimester: Secondary | ICD-10-CM | POA: Diagnosis not present

## 2017-01-07 DIAGNOSIS — Z1389 Encounter for screening for other disorder: Secondary | ICD-10-CM

## 2017-01-07 DIAGNOSIS — O26879 Cervical shortening, unspecified trimester: Secondary | ICD-10-CM

## 2017-01-07 DIAGNOSIS — O0993 Supervision of high risk pregnancy, unspecified, third trimester: Secondary | ICD-10-CM | POA: Diagnosis not present

## 2017-01-07 DIAGNOSIS — Z331 Pregnant state, incidental: Secondary | ICD-10-CM | POA: Diagnosis not present

## 2017-01-07 LAB — POCT URINALYSIS DIPSTICK
Blood, UA: NEGATIVE
Glucose, UA: NEGATIVE
Ketones, UA: NEGATIVE
NITRITE UA: NEGATIVE
PROTEIN UA: NEGATIVE

## 2017-01-07 NOTE — Progress Notes (Addendum)
Fetal Surveillance Testing today:  Robyn Willis   High Risk Pregnancy Diagnosis(es):   Short/dynamic cx  G3P1011 5237w1d Estimated Date of Delivery: 02/24/17  Blood pressure 98/60, pulse 82, weight 128 lb 8 oz (58.3 kg), last menstrual period 05/20/2016.  Urinalysis: Positive for leukocytes   HPI: The patient is being seen today for ongoing management of the above. Using prometrium qhs and takes procardia when cramping, now only about once a week. Today she reports no complaints other than some muscle pain in thighs/groin   BP weight and urine results all reviewed and noted. Patient reports good fetal movement, denies any bleeding and no rupture of membranes symptoms or regular contractions.   Patient is without complaints other than noted in her HPI. All questions were answered.  All lab and sonogram results have been reviewed. Comments:  Robyn Willis 31+5 wks,cephalic,post pl gr 3,normal ovaries bilat,afi 9 cm,fhr 141 bpm,efw 1819 g 23 % AC 5%,RI .68,.70 86%,shortened dynamic CX: length 1.7(w/pressure) -2.9 cm (w/o pressure  a). Normal fluid volume b). Technically normal Fetal Doppler ratios but statistically, on the high side, but not greater tha 90% yet with consistent diastolic flow  c). Normal growth percentile, 23%, with  interval growth, but AC 5% which suggests early evidence of uteroplacental insufficiency  3.  Normal general sonographic findings Lack of clinical data and/or  evidence for cervical length determinations at this gestation, but in this case, cervical length is stable and no further recommendations specific to the cervical length  Assessment:  1.  Pregnancy at 6737w1d,  Estimated Date of Delivery: 02/24/17 :                          2.  Low AC                        3.  Short cx  Medication(s) Plans:  Continue prometrium qhs and procardia prn  Treatment Plan:    twice weekly testing, NST alternating with sonograms(with BPP/Dopplers, EFW q3 weeks) beginning next week  Return in  about 2 weeks (around 01/21/2017) for HROB. for appointment for high risk OB care  No orders of the defined types were placed in this encounter.  Orders Placed This Encounter  Procedures  . POCT Urinalysis Dipstick

## 2017-01-07 NOTE — Progress Notes (Signed)
US 31+5 wks,cephalic,post pl gr 3,normal ovaries bilat,afi 9 cm,fhr 141 bpm,efw 1819 g 23 % AC 5%,RI .68,.70 86%,shortened dynamic CX: length 1.7(w/pressure) -2.9 cm (w/o pressure)

## 2017-01-08 ENCOUNTER — Encounter: Payer: Self-pay | Admitting: Women's Health

## 2017-01-08 DIAGNOSIS — O365990 Maternal care for other known or suspected poor fetal growth, unspecified trimester, not applicable or unspecified: Secondary | ICD-10-CM | POA: Insufficient documentation

## 2017-01-13 ENCOUNTER — Ambulatory Visit (INDEPENDENT_AMBULATORY_CARE_PROVIDER_SITE_OTHER): Payer: Medicaid Other | Admitting: Obstetrics and Gynecology

## 2017-01-13 ENCOUNTER — Other Ambulatory Visit: Payer: Medicaid Other

## 2017-01-13 ENCOUNTER — Encounter: Payer: Self-pay | Admitting: Obstetrics and Gynecology

## 2017-01-13 VITALS — BP 100/60 | HR 80 | Wt 129.8 lb

## 2017-01-13 DIAGNOSIS — O0990 Supervision of high risk pregnancy, unspecified, unspecified trimester: Secondary | ICD-10-CM

## 2017-01-13 DIAGNOSIS — Z331 Pregnant state, incidental: Secondary | ICD-10-CM | POA: Diagnosis not present

## 2017-01-13 DIAGNOSIS — O365910 Maternal care for other known or suspected poor fetal growth, first trimester, not applicable or unspecified: Secondary | ICD-10-CM

## 2017-01-13 DIAGNOSIS — O365930 Maternal care for other known or suspected poor fetal growth, third trimester, not applicable or unspecified: Secondary | ICD-10-CM | POA: Diagnosis not present

## 2017-01-13 DIAGNOSIS — Z1389 Encounter for screening for other disorder: Secondary | ICD-10-CM | POA: Diagnosis not present

## 2017-01-13 DIAGNOSIS — O0993 Supervision of high risk pregnancy, unspecified, third trimester: Secondary | ICD-10-CM

## 2017-01-13 LAB — POCT URINALYSIS DIPSTICK
Glucose, UA: NEGATIVE
Ketones, UA: NEGATIVE
Nitrite, UA: NEGATIVE
Protein, UA: NEGATIVE

## 2017-01-13 NOTE — Progress Notes (Signed)
31

## 2017-01-13 NOTE — Progress Notes (Signed)
High Risk Pregnancy HROB Diagnosis(es):   Short/dynamic cx  G3P1011 306w0d Estimated Date of Delivery: 02/24/17    HPI: The patient is being seen today for ongoing management of above. Today she has no complaints.  Patient reports good fetal movement, denies any bleeding and no rupture of membranes symptoms or regular contractions.   BP weight and urine results reviewed and noted. Blood pressure 100/60, pulse 80, weight 129 lb 12.8 oz (58.9 kg), last menstrual period 05/20/2016.  Fundal Height:  31 cm Fetal Heart rate:  150 with accels to 165 Physical Examination: Abdomen - soft, nontender, nondistended, no masses or organomegaly                                     Pelvic - examination not indicated                                      Urinalysis: trace blood and 3+ leukocytes   Fetal Surveillance Testing today:  NST=Reactive  Lab and sonogram results have been reviewed. Comments:    Assessment:  1.  Pregnancy at 236w0d,  G3P1011   :   Estimated Date of Delivery: 02/24/17                        2. Low AC                        3. Short cx  Medication(s) Plans:  Continue Prometrium pv  qhs and procardia PRN   Treatment Plan:  Twice weekly testing, NST alternating with sonograms (with BPP/Dopplers, EFW q3 weeks)  Follow up in 3 days for appointment for high risk OB care, BPP  By signing my name below, I, Sonum Patel, attest that this documentation has been prepared under the direction and in the presence of Tilda BurrowFerguson, Autumm Hattery V, MD. Electronically Signed: Sonum Patel, Neurosurgeoncribe. 01/13/17. 11:42 AM.  I personally performed the services described in this documentation, which was SCRIBED in my presence. The recorded information has been reviewed and considered accurate. It has been edited as necessary during review. Tilda BurrowFERGUSON,Brenn Gatton V, MD

## 2017-01-14 ENCOUNTER — Other Ambulatory Visit: Payer: Medicaid Other

## 2017-01-15 ENCOUNTER — Other Ambulatory Visit: Payer: Self-pay | Admitting: Obstetrics and Gynecology

## 2017-01-15 DIAGNOSIS — O365931 Maternal care for other known or suspected poor fetal growth, third trimester, fetus 1: Secondary | ICD-10-CM

## 2017-01-16 ENCOUNTER — Ambulatory Visit (INDEPENDENT_AMBULATORY_CARE_PROVIDER_SITE_OTHER): Payer: Medicaid Other

## 2017-01-16 ENCOUNTER — Ambulatory Visit (INDEPENDENT_AMBULATORY_CARE_PROVIDER_SITE_OTHER): Payer: Medicaid Other | Admitting: Advanced Practice Midwife

## 2017-01-16 ENCOUNTER — Encounter: Payer: Self-pay | Admitting: Advanced Practice Midwife

## 2017-01-16 VITALS — BP 90/60 | HR 80 | Wt 131.0 lb

## 2017-01-16 DIAGNOSIS — O365931 Maternal care for other known or suspected poor fetal growth, third trimester, fetus 1: Secondary | ICD-10-CM

## 2017-01-16 DIAGNOSIS — Z1389 Encounter for screening for other disorder: Secondary | ICD-10-CM

## 2017-01-16 DIAGNOSIS — O0993 Supervision of high risk pregnancy, unspecified, third trimester: Secondary | ICD-10-CM | POA: Diagnosis not present

## 2017-01-16 DIAGNOSIS — O26879 Cervical shortening, unspecified trimester: Secondary | ICD-10-CM

## 2017-01-16 DIAGNOSIS — Z331 Pregnant state, incidental: Secondary | ICD-10-CM | POA: Diagnosis not present

## 2017-01-16 LAB — POCT URINALYSIS DIPSTICK
Blood, UA: NEGATIVE
GLUCOSE UA: NEGATIVE
KETONES UA: NEGATIVE
Nitrite, UA: NEGATIVE
PROTEIN UA: NEGATIVE

## 2017-01-16 NOTE — Patient Instructions (Signed)

## 2017-01-16 NOTE — Progress Notes (Signed)
US 34+3 wks,cephalic,fhr 147 bpm,normal ov's bilat,BPP 8/8,post pl g 3,RI .63,.63 70%,AFI 11.8 CM

## 2017-01-16 NOTE — Progress Notes (Signed)
Fetal Surveillance Testing today:  BPP/Dopplers   High Risk Pregnancy Diagnosis(es):   Short/dynamic cx, FGR w/low AC/high normal dopplers  G3P1011 979w3d Estimated Date of Delivery: 02/24/17  Last menstrual period 05/20/2016.  Urinalysis: Negative   HPI:` The patient is being seen today for ongoing management of the above. Today she reports no complaints.      BP weight and urine results all reviewed and noted. Patient reports good fetal movement, denies any bleeding and no rupture of membranes symptoms or regular contractions.US    Patient is without complaints other than noted in her HPI. All questions were answered.  All lab and sonogram results have been reviewed. Comments: normal US 34+3 wks,cephalic,fhr 147 bpm,normal ov's bilat,BPP 8/8,post pl g 3,RI .63,.63 70%,AFI 11.8 CM  Assessment:  1.  Pregnancy at 659w3d,  Estimated Date of Delivery: 02/24/17 :                         2.  Low AC, normal dopplers                        3.  Short cx  Medication(s) Plans:  Continue prometrium qhs and procardia prn  Treatment Plan:   twice weekly testing, NST alternating with sonograms(with BPP/Dopplers, EFW q3 weeks)  Return for Mondays NST/HROB and THurs US/HROB (schedule  as  many as possible for the next few weeks). for appointment for high risk OB care  No orders of the defined types were placed in this encounter.  No orders of the defined types were placed in this encounter.

## 2017-01-20 ENCOUNTER — Other Ambulatory Visit: Payer: Medicaid Other

## 2017-01-20 ENCOUNTER — Ambulatory Visit (INDEPENDENT_AMBULATORY_CARE_PROVIDER_SITE_OTHER): Payer: Medicaid Other | Admitting: Obstetrics and Gynecology

## 2017-01-20 ENCOUNTER — Other Ambulatory Visit: Payer: Medicaid Other | Admitting: Women's Health

## 2017-01-20 ENCOUNTER — Encounter: Payer: Self-pay | Admitting: Obstetrics and Gynecology

## 2017-01-20 VITALS — BP 100/50 | HR 80 | Wt 129.8 lb

## 2017-01-20 DIAGNOSIS — O26879 Cervical shortening, unspecified trimester: Secondary | ICD-10-CM

## 2017-01-20 DIAGNOSIS — Z331 Pregnant state, incidental: Secondary | ICD-10-CM

## 2017-01-20 DIAGNOSIS — Z3A35 35 weeks gestation of pregnancy: Secondary | ICD-10-CM

## 2017-01-20 DIAGNOSIS — Z1389 Encounter for screening for other disorder: Secondary | ICD-10-CM | POA: Diagnosis not present

## 2017-01-20 DIAGNOSIS — O09893 Supervision of other high risk pregnancies, third trimester: Secondary | ICD-10-CM

## 2017-01-20 DIAGNOSIS — O365930 Maternal care for other known or suspected poor fetal growth, third trimester, not applicable or unspecified: Secondary | ICD-10-CM | POA: Diagnosis not present

## 2017-01-20 LAB — POCT URINALYSIS DIPSTICK
Glucose, UA: NEGATIVE
Ketones, UA: NEGATIVE
NITRITE UA: NEGATIVE
Protein, UA: NEGATIVE
RBC UA: NEGATIVE

## 2017-01-20 NOTE — Progress Notes (Signed)
High Risk Pregnancy Diagnosis(es): IUGR, short cx G3P1011 1685w0d Estimated Date of Delivery: 02/24/17 BP (!) 100/50   Pulse 80   Wt 129 lb 12.8 oz (58.9 kg)   LMP 05/20/2016 (Approximate)   BMI 22.28 kg/m   Urinalysis: Positive for 2+ leuks HPI:  Doing well, some occ uc's BP, weight, and urine reviewed.  Reports good fm. Denies regular uc's, lof, vb, uti s/s.   Fundal Height:  31 Fetal Heart rate:  155 Edema:  none  Reviewed ptl s/s, fkc All questions were answered Assessment: 6785w0d IUGR, short cx Medication(s) Plans:  n/a Treatment Plan:  2x/wk testing nst alt w/ bpp/dopp, efw ~36wks, IOL 37wks or prn Follow up in 3d as scheduled for high-risk OB appt and bpp/dopp u/s

## 2017-01-20 NOTE — Patient Instructions (Signed)
Call the office ([phone]) or go to [facility] if:  You begin to have strong, frequent contractions  Your water breaks.  Sometimes it is a big gush of fluid, sometimes it is just a trickle that keeps getting your panties wet or running down your legs  You have vaginal bleeding.  It is normal to have a small amount of spotting if your cervix was checked.   You don't feel your baby moving like normal.  If you don't, get you something to eat and drink and lay down and focus on feeling your baby move.  You should feel at least 10 movements in 2 hours.  If you don't, you should call the office or go to [facility].     Preterm Labor and Birth Information The normal length of a pregnancy is 39-41 weeks. Preterm labor is when labor starts before 37 completed weeks of pregnancy. What are the risk factors for preterm labor? Preterm labor is more likely to occur in women who:  Have certain infections during pregnancy such as a bladder infection, sexually transmitted infection, or infection inside the uterus (chorioamnionitis).  Have a shorter-than-normal cervix.  Have gone into preterm labor before.  Have had surgery on their cervix.  Are younger than age 17 or older than age 35.  Are African American.  Are pregnant with twins or multiple babies (multiple gestation).  Take street drugs or smoke while pregnant.  Do not gain enough weight while pregnant.  Became pregnant shortly after having been pregnant.  What are the symptoms of preterm labor? Symptoms of preterm labor include:  Cramps similar to those that can happen during a menstrual period. The cramps may happen with diarrhea.  Pain in the abdomen or lower back.  Regular uterine contractions that may feel like tightening of the abdomen.  A feeling of increased pressure in the pelvis.  Increased watery or bloody mucus discharge from the vagina.  Water breaking (ruptured amniotic sac).  Why is it important to  recognize signs of preterm labor? It is important to recognize signs of preterm labor because babies who are born prematurely may not be fully developed. This can put them at an increased risk for:  Long-term (chronic) heart and lung problems.  Difficulty immediately after birth with regulating body systems, including blood sugar, body temperature, heart rate, and breathing rate.  Bleeding in the brain.  Cerebral palsy.  Learning difficulties.  Death.  These risks are highest for babies who are born before 34 weeks of pregnancy. How is preterm labor treated? Treatment depends on the length of your pregnancy, your condition, and the health of your baby. It may involve:  Having a stitch (suture) placed in your cervix to prevent your cervix from opening too early (cerclage).  Taking or being given medicines, such as: ? Hormone medicines. These may be given early in pregnancy to help support the pregnancy. ? Medicine to stop contractions. ? Medicines to help mature the baby's lungs. These may be prescribed if the risk of delivery is high. ? Medicines to prevent your baby from developing cerebral palsy.  If the labor happens before 34 weeks of pregnancy, you may need to stay in the hospital. What should I do if I think I am in preterm labor? If you think that you are going into preterm labor, call your health care provider right away. How can I prevent preterm labor in future pregnancies? To increase your chance of having a full-term pregnancy:  Do not use   any tobacco products, such as cigarettes, chewing tobacco, and e-cigarettes. If you need help quitting, ask your health care provider.  Do not use street drugs or medicines that have not been prescribed to you during your pregnancy.  Talk with your health care provider before taking any herbal supplements, even if you have been taking them regularly.  Make sure you gain a healthy amount of weight during your pregnancy.  Watch  for infection. If you think that you might have an infection, get it checked right away.  Make sure to tell your health care provider if you have gone into preterm labor before.  This information is not intended to replace advice given to you by your health care provider. Make sure you discuss any questions you have with your health care provider. Document Released: 09/28/2003 Document Revised: 12/19/2015 Document Reviewed: 11/29/2015 Elsevier Interactive Patient Education  2018 Elsevier Inc.  

## 2017-01-21 ENCOUNTER — Encounter: Payer: Medicaid Other | Admitting: Obstetrics & Gynecology

## 2017-01-23 ENCOUNTER — Encounter: Payer: Medicaid Other | Admitting: Obstetrics and Gynecology

## 2017-01-23 ENCOUNTER — Other Ambulatory Visit: Payer: Medicaid Other

## 2017-01-27 ENCOUNTER — Ambulatory Visit (INDEPENDENT_AMBULATORY_CARE_PROVIDER_SITE_OTHER): Payer: Medicaid Other | Admitting: Obstetrics & Gynecology

## 2017-01-27 ENCOUNTER — Encounter: Payer: Self-pay | Admitting: Obstetrics & Gynecology

## 2017-01-27 VITALS — BP 90/60 | HR 78 | Wt 134.4 lb

## 2017-01-27 DIAGNOSIS — O365930 Maternal care for other known or suspected poor fetal growth, third trimester, not applicable or unspecified: Secondary | ICD-10-CM

## 2017-01-27 DIAGNOSIS — O26873 Cervical shortening, third trimester: Secondary | ICD-10-CM

## 2017-01-27 DIAGNOSIS — Z331 Pregnant state, incidental: Secondary | ICD-10-CM | POA: Diagnosis not present

## 2017-01-27 DIAGNOSIS — Z3A36 36 weeks gestation of pregnancy: Secondary | ICD-10-CM | POA: Diagnosis not present

## 2017-01-27 DIAGNOSIS — O09893 Supervision of other high risk pregnancies, third trimester: Secondary | ICD-10-CM

## 2017-01-27 DIAGNOSIS — Z1389 Encounter for screening for other disorder: Secondary | ICD-10-CM | POA: Diagnosis not present

## 2017-01-27 DIAGNOSIS — O26879 Cervical shortening, unspecified trimester: Secondary | ICD-10-CM

## 2017-01-27 DIAGNOSIS — O0993 Supervision of high risk pregnancy, unspecified, third trimester: Secondary | ICD-10-CM

## 2017-01-27 LAB — POCT URINALYSIS DIPSTICK
Glucose, UA: NEGATIVE
Ketones, UA: NEGATIVE
Leukocytes, UA: NEGATIVE
NITRITE UA: NEGATIVE
RBC UA: NEGATIVE

## 2017-01-27 NOTE — Progress Notes (Signed)
Fetal Surveillance Testing today:  Reactive NST   High Risk Pregnancy Diagnosis(es):   FGR, short cervix  G3P1011 9227w0d Estimated Date of Delivery: 02/24/17  Blood pressure 90/60, pulse 78, weight 134 lb 6.4 oz (61 kg), last menstrual period 05/20/2016.  Urinalysis: Positive   HPI: The patient is being seen today for ongoing management of as above. Today she reports some contractions   BP weight and urine results all reviewed and noted. Patient reports good fetal movement, denies any bleeding and no rupture of membranes symptoms or regular contractions.  Fundal Height:  34 Fetal Heart rate:  140 Edema:  none  Patient is without complaints other than noted in her HPI. All questions were answered.  All lab and sonogram results have been reviewed. Comments:    Assessment:  1.  Pregnancy at 827w0d,  Estimated Date of Delivery: 02/24/17 :                          2.  FGR, AC <5%                        3.  Short cervix  Medication(s) Plans:  Continue procardia xl 30 mg  Treatment Plan:  Sonogram on Thursday, already scheduled, induction next week for FGR  Return in about 3 days (around 01/30/2017) for BPP/sono, HROB. for appointment for high risk OB care  No orders of the defined types were placed in this encounter.  Orders Placed This Encounter  Procedures  . POCT urinalysis dipstick

## 2017-01-28 ENCOUNTER — Encounter (HOSPITAL_COMMUNITY): Payer: Self-pay

## 2017-01-28 ENCOUNTER — Emergency Department (HOSPITAL_COMMUNITY)
Admission: EM | Admit: 2017-01-28 | Discharge: 2017-01-28 | Disposition: A | Discharge status: Home / Self Care | Payer: Medicaid Other | Attending: Emergency Medicine | Admitting: Emergency Medicine

## 2017-01-28 DIAGNOSIS — O4703 False labor before 37 completed weeks of gestation, third trimester: Secondary | ICD-10-CM

## 2017-01-28 DIAGNOSIS — Z3A00 Weeks of gestation of pregnancy not specified: Secondary | ICD-10-CM | POA: Insufficient documentation

## 2017-01-28 DIAGNOSIS — Z3480 Encounter for supervision of other normal pregnancy, unspecified trimester: Secondary | ICD-10-CM | POA: Diagnosis not present

## 2017-01-28 DIAGNOSIS — Z87891 Personal history of nicotine dependence: Secondary | ICD-10-CM | POA: Insufficient documentation

## 2017-01-28 DIAGNOSIS — R1030 Lower abdominal pain, unspecified: Secondary | ICD-10-CM | POA: Diagnosis present

## 2017-01-28 DIAGNOSIS — Z3490 Encounter for supervision of normal pregnancy, unspecified, unspecified trimester: Secondary | ICD-10-CM

## 2017-01-28 DIAGNOSIS — Z79899 Other long term (current) drug therapy: Secondary | ICD-10-CM | POA: Diagnosis not present

## 2017-01-28 LAB — URINALYSIS, ROUTINE W REFLEX MICROSCOPIC
BILIRUBIN URINE: NEGATIVE
Bacteria, UA: NONE SEEN
Glucose, UA: NEGATIVE mg/dL
HGB URINE DIPSTICK: NEGATIVE
KETONES UR: NEGATIVE mg/dL
NITRITE: NEGATIVE
PROTEIN: NEGATIVE mg/dL
SPECIFIC GRAVITY, URINE: 1.005 (ref 1.005–1.030)
pH: 7 (ref 5.0–8.0)

## 2017-01-28 LAB — I-STAT CHEM 8, ED
CHLORIDE: 105 mmol/L (ref 101–111)
Calcium, Ion: 1.08 mmol/L — ABNORMAL LOW (ref 1.15–1.40)
Creatinine, Ser: 0.4 mg/dL — ABNORMAL LOW (ref 0.44–1.00)
Glucose, Bld: 77 mg/dL (ref 65–99)
HEMATOCRIT: 35 % — AB (ref 36.0–46.0)
Hemoglobin: 11.9 g/dL — ABNORMAL LOW (ref 12.0–15.0)
POTASSIUM: 3.7 mmol/L (ref 3.5–5.1)
SODIUM: 138 mmol/L (ref 135–145)
TCO2: 21 mmol/L (ref 0–100)

## 2017-01-28 MED ORDER — SODIUM CHLORIDE 0.9 % IV BOLUS (SEPSIS)
1000.0000 mL | Freq: Once | INTRAVENOUS | Status: AC
  Administered 2017-01-28: 1000 mL via INTRAVENOUS

## 2017-01-28 NOTE — Discharge Instructions (Signed)

## 2017-01-28 NOTE — ED Notes (Signed)
Per Dr. Emelda FearFerguson - pt may be removed from the baby monitor.

## 2017-01-28 NOTE — ED Notes (Signed)
[facility] RN notified to enter pt into monitor. Pt has been placed on monitor here at [facility] with fetal heart rate around 145bpm.

## 2017-01-28 NOTE — ED Notes (Signed)
Dr. Ferguson at bedside. 

## 2017-01-28 NOTE — Progress Notes (Signed)
  History     CSN: [id number] Labor check.   Arrival date and time: 01/28/17 0709   None     Chief Complaint  Patient presents with  . Abdominal Pain   HPI : Labor check. This 4378w1d G3P1011 was seen for Low risk ob visit yesterday, had cervical check, was reported as 3 cm, posterior. Pt noted some light brown d/c this am, no fresh bleeding, and no ROM. Pt alleges no family transportation, [facility] Pt encouraged to show family [facility] issues.      Past Medical History:  Diagnosis Date  . Medical history non-contributory     Past Surgical History:  Procedure Laterality Date  . NO PAST SURGERIES      Family History  Problem Relation Age of Onset  . Kidney disease Mother   . Diabetes Mother   . Hypertension Mother   . Diabetes Father   . Hypertension Father   . Heart disease Maternal Grandmother   . Alzheimer's disease Paternal Grandmother     Social History  Substance Use Topics  . Smoking status: Former Games developermoker  . Smokeless tobacco: Never Used  . Alcohol use No    Allergies:  Allergies  Allergen Reactions  . Penicillins Hives    Has patient had a PCN reaction causing immediate rash, facial/tongue/throat swelling, SOB or lightheadedness with hypotension: No Has patient had a PCN reaction causing severe rash involving mucus membranes or skin necrosis: No Has patient had a PCN reaction that required hospitalization No Has patient had a PCN reaction occurring within the last 10 years: No If all of the above answers are "NO", then may proceed with Cephalosporin      (Not in a hospital admission)  Review of Systems  Constitutional: Negative.   Genitourinary: Positive for vaginal pain.   Physical Exam   Blood pressure 126/87, pulse 72, temperature 97.9 F (36.6 C), temperature source Oral, resp. rate 16, height 5\' 4"  (1.626 m), weight 134 lb (60.8 kg), last menstrual period 05/20/2016, SpO2 100 %.  Physical Exam   Constitutional: She appears well-developed and well-nourished.  HENT:  Head: Normocephalic and atraumatic.  Eyes: Pupils are equal, round, and reactive to light.  Neck: Neck supple.  Cardiovascular: Normal rate and regular rhythm.   Respiratory: Effort normal.  GI: Soft.  Gravid nontender uterus. NST reactive. No uterine activity.  Cervix 2 cm posterior very posterior , low presenting vertex presentation Review of monitor after 1 hour, no contractions. FHR Cat I.  MAU Course  Procedures  MDM Serial exam NST.  Assessment and Plan  Lite spotting s/p routine OB office exam False Labor. Pelvic pressure due to low presenting vertex presentation. Discharge home followup office prn, go to [facility] prn.  Markita Stcharles V 01/28/2017, 9:19 AM

## 2017-01-28 NOTE — ED Notes (Signed)
Rueben BashM. White notifying rapid response [facility]

## 2017-01-28 NOTE — ED Provider Notes (Signed)
[facility] Provider Note   CSN: [id number] Arrival date & time: 01/28/17  0709     History   Chief Complaint Chief Complaint  Patient presents with  . Abdominal Pain    HPI Robyn Willis is a 26 y.o. female.  Patient states that she is term pregnancy and was seen by her OB/GYN yesterday was 3 cm. She had a brown discharge today and is having abdominal discomfort   The history is provided by the patient.  Abdominal Pain   This is a recurrent problem. The current episode started 3 to 5 hours ago. The problem occurs constantly. The problem has not changed since onset.The pain is associated with an unknown factor. The pain is located in the suprapubic region. The quality of the pain is aching. The pain is at a severity of 4/10. The pain is moderate. Pertinent negatives include anorexia, diarrhea, frequency, hematuria and headaches.    Past Medical History:  Diagnosis Date  . Medical history non-contributory     Patient Active Problem List   Diagnosis Date Noted  . IUGR (intrauterine growth restriction) affecting care of mother Low AC <10%ile 01/08/2017  . Short cervix, antepartum 10/08/2016  . Hives 09/10/2016  . Marijuana use 08/15/2016  . Depression 08/13/2016  . Supervision of high risk pregnancy, antepartum 01/02/2016    Past Surgical History:  Procedure Laterality Date  . NO PAST SURGERIES      OB History    Gravida Para Term Preterm AB Living   3 1 1   1 1    SAB TAB Ectopic Multiple Live Births   0       1       Home Medications    Prior to Admission medications   Medication Sig Start Date End Date Taking? Authorizing Provider  diphenhydrAMINE (BENADRYL) 25 MG tablet Take 25 mg by mouth at bedtime as needed for sleep.   Yes [provider]  Prenat w/o A-[id number]-[id number] (CITRANATAL ASSURE) 35-1 & 300 MG tablet One tablet and one capsule daily 08/26/16  Yes Cheral MarkerBooker, Kimberly R, CNM  progesterone (PROMETRIUM) 200 MG capsule Place 1  capsule (200 mg total) vaginally at bedtime. 10/08/16  Yes Cheral MarkerBooker, Kimberly R, CNM  ranitidine (ZANTAC) 150 MG tablet Take 1 tablet (150 mg total) by mouth 2 (two) times daily. 09/10/16  Yes Cheral MarkerBooker, Kimberly R, CNM    Family History Family History  Problem Relation Age of Onset  . Kidney disease Mother   . Diabetes Mother   . Hypertension Mother   . Diabetes Father   . Hypertension Father   . Heart disease Maternal Grandmother   . Alzheimer's disease Paternal Grandmother     Social History Social History  Substance Use Topics  . Smoking status: Former Games developermoker  . Smokeless tobacco: Never Used  . Alcohol use No     Allergies   Penicillins   Review of Systems Review of Systems  Constitutional: Negative for appetite change and fatigue.  HENT: Negative for congestion, ear discharge and sinus pressure.   Eyes: Negative for discharge.  Respiratory: Negative for cough.   Cardiovascular: Negative for chest pain.  Gastrointestinal: Positive for abdominal pain. Negative for anorexia and diarrhea.  Genitourinary: Negative for frequency and hematuria.  Musculoskeletal: Negative for back pain.  Skin: Negative for rash.  Neurological: Negative for seizures and headaches.  Psychiatric/Behavioral: Negative for hallucinations.     Physical Exam Updated Vital Signs BP (!) 113/97 (BP Location: Left Arm)   Pulse 74  Temp 97.7 F (36.5 C) (Oral)   Resp 18   Ht 5\' 4"  (1.626 m)   Wt 60.8 kg (134 lb)   LMP 05/20/2016 (Approximate)   SpO2 100%   BMI 23.00 kg/m   Physical Exam  Constitutional: She is oriented to person, place, and time. She appears well-developed.  HENT:  Head: Normocephalic.  Eyes: Conjunctivae and EOM are normal. No scleral icterus.  Neck: Neck supple. No thyromegaly present.  Cardiovascular: Normal rate and regular rhythm.  Exam reveals no gallop and no friction rub.   No murmur heard. Pulmonary/Chest: No stridor. She has no wheezes. She has no rales. She  exhibits no tenderness.  Abdominal: She exhibits no distension. There is no rebound.  Patient is obviously term pregnancy with mild tenderness suprapubic  Genitourinary:  Genitourinary Comments: Bimanual exam shows baby is vertex cervix is very posterior. Possibly about 3 cm dilated. 75% effaced.  Musculoskeletal: Normal range of motion. She exhibits no edema.  Lymphadenopathy:    She has no cervical adenopathy.  Neurological: She is oriented to person, place, and time. She exhibits normal muscle tone. Coordination normal.  Skin: No rash noted. No erythema.  Psychiatric: She has a normal mood and affect. Her behavior is normal.     ED Treatments / Results  Labs (all labs ordered are listed, but only abnormal results are displayed) Labs Reviewed  URINALYSIS, ROUTINE W REFLEX MICROSCOPIC - Abnormal; Notable for the following:       Result Value   Color, Urine STRAW (*)    Leukocytes, UA SMALL (*)    Squamous Epithelial / LPF 0-5 (*)    All other components within normal limits  I-STAT CHEM 8, ED - Abnormal; Notable for the following:    BUN <3 (*)    Creatinine, Ser 0.40 (*)    Calcium, Ion 1.08 (*)    Hemoglobin 11.9 (*)    HCT 35.0 (*)    All other components within normal limits    EKG  EKG Interpretation None       Radiology No results found.  Procedures Procedures (including critical care time)  Medications Ordered in ED Medications  sodium chloride 0.9 % bolus 1,000 mL (0 mLs Intravenous Stopped 01/28/17 0845)     Initial Impression / Assessment and Plan / ED Course  I have reviewed the triage vital signs and the nursing notes.  Pertinent labs & imaging results that were available during my care of the patient were reviewed by me and considered in my medical decision making (see chart for details).     The patient was examined by Dr. Emelda Fear the OB/GYN doctor and he felt like she was safe to be discharged home. She will continue her care at his office or  go to [facility] if she goes into labor  Final Clinical Impressions(s) / ED Diagnoses   Final diagnoses:  None    New Prescriptions New Prescriptions   No medications on file     Bethann Berkshire, MD 01/28/17 [phone]

## 2017-01-28 NOTE — Progress Notes (Signed)
Spoke with Dr Emelda FearFerguson who is in house [facility].  He says he will go down and check pt cervix

## 2017-01-28 NOTE — Progress Notes (Signed)
Spoke with Arther DamesLeslie, Rn at [facility].  She says it is her understanding that pt is to dc home

## 2017-01-28 NOTE — ED Triage Notes (Signed)
Pt reports she is [redacted] weeks pregnant, due date is Aug 5.  Reports pregnancy is high risk because she has a thin cervix.  Pt says she saw Dr. Despina HiddenEure yesterday and he checked her cervix and told her she was dilated to 3cm.  Pt says has been having "mild" contractions since 1am and woke up with brownish/bloody discharge.  Also reports baby not moving as much as usual.  This is patient's 3rd pregnancy, has 1 living child.

## 2017-01-28 NOTE — Progress Notes (Signed)
[facility].  Pt presented this am complaining of ucs q2810min since 1am and bloody to brown discharge.  Pt reports that she has a short cervix and was 3cm in the office yesterday but did not know [facility] so [facility].

## 2017-01-30 ENCOUNTER — Ambulatory Visit (INDEPENDENT_AMBULATORY_CARE_PROVIDER_SITE_OTHER): Payer: Medicaid Other | Admitting: Women's Health

## 2017-01-30 ENCOUNTER — Other Ambulatory Visit: Payer: Self-pay | Admitting: Women's Health

## 2017-01-30 ENCOUNTER — Encounter: Payer: Self-pay | Admitting: Women's Health

## 2017-01-30 ENCOUNTER — Ambulatory Visit (INDEPENDENT_AMBULATORY_CARE_PROVIDER_SITE_OTHER): Payer: Medicaid Other

## 2017-01-30 VITALS — BP 112/58 | HR 78 | Wt 135.0 lb

## 2017-01-30 DIAGNOSIS — Z1389 Encounter for screening for other disorder: Secondary | ICD-10-CM

## 2017-01-30 DIAGNOSIS — Z331 Pregnant state, incidental: Secondary | ICD-10-CM | POA: Diagnosis not present

## 2017-01-30 DIAGNOSIS — Z3A36 36 weeks gestation of pregnancy: Secondary | ICD-10-CM

## 2017-01-30 DIAGNOSIS — O365930 Maternal care for other known or suspected poor fetal growth, third trimester, not applicable or unspecified: Secondary | ICD-10-CM

## 2017-01-30 DIAGNOSIS — O09893 Supervision of other high risk pregnancies, third trimester: Secondary | ICD-10-CM

## 2017-01-30 DIAGNOSIS — O0993 Supervision of high risk pregnancy, unspecified, third trimester: Secondary | ICD-10-CM

## 2017-01-30 LAB — POCT URINALYSIS DIPSTICK
Glucose, UA: NEGATIVE
KETONES UA: NEGATIVE
NITRITE UA: NEGATIVE
PROTEIN UA: NEGATIVE

## 2017-01-30 NOTE — Progress Notes (Signed)
US 36+3 wks,cephalic,BPP 8/8,normal ovaries bilat,post pl gr 3,RI .58,.56=53%,FHR 160 bpm,AFI 10.7 cm,efw 2630 g 26%,AC 10.7%,HC 4.7%

## 2017-01-30 NOTE — Progress Notes (Signed)
High Risk Pregnancy Diagnosis(es): IUGR, short cx G3P1011 8067w3d Estimated Date of Delivery: 02/24/17 BP (!) 112/58   Pulse 78   Wt 135 lb (61.2 kg)   LMP 05/20/2016 (Approximate)   BMI 23.17 kg/m   Urinalysis: Positive for tr blood/tr leuks HPI:  Doing well, [facility] to [facility]/ abd pain/spotting after last visit- [facility] BP, weight, and urine reviewed.  Reports good fm. Denies regular uc's, lof, vb, uti s/s. No complaints.  Fundal Height:  34 Fetal Heart rate:  160 u/s Edema:  trace  Reviewed today's u/s: AC up to 10.7% (from 5%), HC 4.7% w/ overall EFW 22%, normal AFI, bpp 8/8, dopp 53%. Discussed w/ LHE as his note said IOL @ 37wks, states OK to push out until 39wks based on today's u/s All questions were answered Assessment: 4267w3d IUGR, short cx Medication(s) Plans:  n/a Treatment Plan:  2x/wk testing nst alt w/ bpp/dopp, IOL @ 39wks or earlier if clinically indicated Follow up on Monday for high-risk OB appt and NST

## 2017-01-30 NOTE — Patient Instructions (Addendum)
Call the office ([phone]) or go to [facility] if:  You begin to have strong, frequent contractions  Your water breaks.  Sometimes it is a big gush of fluid, sometimes it is just a trickle that keeps getting your panties wet or running down your legs  You have vaginal bleeding.  It is normal to have a small amount of spotting if your cervix was checked.   You don't feel your baby moving like normal.  If you don't, get you something to eat and drink and lay down and focus on feeling your baby move.  You should feel at least 10 movements in 2 hours.  If you don't, you should call the office or go to [facility].     Braxton Hicks Contractions Contractions of the uterus can occur throughout pregnancy, but they are not always a sign that you are in labor. You may have practice contractions called Braxton Hicks contractions. These false labor contractions are sometimes confused with true labor. What are Braxton Hicks contractions? Braxton Hicks contractions are tightening movements that occur in the muscles of the uterus before labor. Unlike true labor contractions, these contractions do not result in opening (dilation) and thinning of the cervix. Toward the end of pregnancy (32-34 weeks), Braxton Hicks contractions can happen more often and may become stronger. These contractions are sometimes difficult to tell apart from true labor because they can be very uncomfortable. You should not feel embarrassed if you go to the hospital with false labor. Sometimes, the only way to tell if you are in true labor is for your health care provider to look for changes in the cervix. The health care provider will do a physical exam and may monitor your contractions. If you are not in true labor, the exam should show that your cervix is not dilating and your water has not broken. If there are no prenatal problems or other health problems associated with your pregnancy, it is completely safe for you to be sent  home with false labor. You may continue to have Braxton Hicks contractions until you go into true labor. How can I tell the difference between true labor and false labor?  Differences ? False labor ? Contractions last 30-70 seconds.: Contractions are usually shorter and not as strong as true labor contractions. ? Contractions become very regular.: Contractions are usually irregular. ? Discomfort is usually felt in the top of the uterus, and it spreads to the lower abdomen and low back.: Contractions are often felt in the front of the lower abdomen and in the groin. ? Contractions do not go away with walking.: Contractions may go away when you walk around or change positions while lying down. ? Contractions usually become more intense and increase in frequency.: Contractions get weaker and are shorter-lasting as time goes on. ? The cervix dilates and gets thinner.: The cervix usually does not dilate or become thin. Follow these instructions at home:  Take over-the-counter and prescription medicines only as told by your health care provider.  Keep up with your usual exercises and follow other instructions from your health care provider.  Eat and drink lightly if you think you are going into labor.  If Braxton Hicks contractions are making you uncomfortable: ? Change your position from lying down or resting to walking, or change from walking to resting. ? Sit and rest in a tub of warm water. ? Drink enough fluid to keep your urine clear or pale yellow. Dehydration may cause these contractions. ?   Do slow and deep breathing several times an hour.  Keep all follow-up prenatal visits as told by your health care provider. This is important. Contact a health care provider if:  You have a fever.  You have continuous pain in your abdomen. Get help right away if:  Your contractions become stronger, more regular, and closer together.  You have fluid leaking or gushing from your vagina.  You  pass blood-tinged mucus (bloody show).  You have bleeding from your vagina.  You have low back pain that you never had before.  You feel your baby's head pushing down and causing pelvic pressure.  Your baby is not moving inside you as much as it used to. Summary  Contractions that occur before labor are called Braxton Hicks contractions, false labor, or practice contractions.  Braxton Hicks contractions are usually shorter, weaker, farther apart, and less regular than true labor contractions. True labor contractions usually become progressively stronger and regular and they become more frequent.  Manage discomfort from Braxton Hicks contractions by changing position, resting in a warm bath, drinking plenty of water, or practicing deep breathing. This information is not intended to replace advice given to you by your health care provider. Make sure you discuss any questions you have with your health care provider. Document Released: 07/08/2005 Document Revised: 05/27/2016 Document Reviewed: 05/27/2016 Elsevier Interactive Patient Education  2017 [employer] Inc.  

## 2017-02-03 ENCOUNTER — Ambulatory Visit (INDEPENDENT_AMBULATORY_CARE_PROVIDER_SITE_OTHER): Payer: Medicaid Other | Admitting: Women's Health

## 2017-02-03 ENCOUNTER — Inpatient Hospital Stay (HOSPITAL_COMMUNITY): Payer: Medicaid Other

## 2017-02-03 ENCOUNTER — Encounter: Payer: Self-pay | Admitting: Women's Health

## 2017-02-03 VITALS — BP 98/56 | HR 82 | Wt 137.0 lb

## 2017-02-03 DIAGNOSIS — O09893 Supervision of other high risk pregnancies, third trimester: Secondary | ICD-10-CM

## 2017-02-03 DIAGNOSIS — Z1389 Encounter for screening for other disorder: Secondary | ICD-10-CM

## 2017-02-03 DIAGNOSIS — O365930 Maternal care for other known or suspected poor fetal growth, third trimester, not applicable or unspecified: Secondary | ICD-10-CM | POA: Diagnosis not present

## 2017-02-03 DIAGNOSIS — O0993 Supervision of high risk pregnancy, unspecified, third trimester: Secondary | ICD-10-CM

## 2017-02-03 DIAGNOSIS — Z3483 Encounter for supervision of other normal pregnancy, third trimester: Secondary | ICD-10-CM

## 2017-02-03 DIAGNOSIS — Z3A37 37 weeks gestation of pregnancy: Secondary | ICD-10-CM

## 2017-02-03 DIAGNOSIS — Z331 Pregnant state, incidental: Secondary | ICD-10-CM

## 2017-02-03 LAB — POCT URINALYSIS DIPSTICK
GLUCOSE UA: NEGATIVE
NITRITE UA: NEGATIVE
Protein, UA: NEGATIVE
RBC UA: NEGATIVE

## 2017-02-03 LAB — POCT HEMOGLOBIN: Hemoglobin: 11.1 g/dL — AB (ref 12.2–16.2)

## 2017-02-03 NOTE — Patient Instructions (Signed)
Call the office ([phone]) or go to [facility] if:  You begin to have strong, frequent contractions  Your water breaks.  Sometimes it is a big gush of fluid, sometimes it is just a trickle that keeps getting your panties wet or running down your legs  You have vaginal bleeding.  It is normal to have a small amount of spotting if your cervix was checked.   You don't feel your baby moving like normal.  If you don't, get you something to eat and drink and lay down and focus on feeling your baby move.  You should feel at least 10 movements in 2 hours.  If you don't, you should call the office or go to [facility].     Braxton Hicks Contractions Contractions of the uterus can occur throughout pregnancy, but they are not always a sign that you are in labor. You may have practice contractions called Braxton Hicks contractions. These false labor contractions are sometimes confused with true labor. What are Braxton Hicks contractions? Braxton Hicks contractions are tightening movements that occur in the muscles of the uterus before labor. Unlike true labor contractions, these contractions do not result in opening (dilation) and thinning of the cervix. Toward the end of pregnancy (32-34 weeks), Braxton Hicks contractions can happen more often and may become stronger. These contractions are sometimes difficult to tell apart from true labor because they can be very uncomfortable. You should not feel embarrassed if you go to the hospital with false labor. Sometimes, the only way to tell if you are in true labor is for your health care provider to look for changes in the cervix. The health care provider will do a physical exam and may monitor your contractions. If you are not in true labor, the exam should show that your cervix is not dilating and your water has not broken. If there are no prenatal problems or other health problems associated with your pregnancy, it is completely safe for you to be sent  home with false labor. You may continue to have Braxton Hicks contractions until you go into true labor. How can I tell the difference between true labor and false labor?  Differences ? False labor ? Contractions last 30-70 seconds.: Contractions are usually shorter and not as strong as true labor contractions. ? Contractions become very regular.: Contractions are usually irregular. ? Discomfort is usually felt in the top of the uterus, and it spreads to the lower abdomen and low back.: Contractions are often felt in the front of the lower abdomen and in the groin. ? Contractions do not go away with walking.: Contractions may go away when you walk around or change positions while lying down. ? Contractions usually become more intense and increase in frequency.: Contractions get weaker and are shorter-lasting as time goes on. ? The cervix dilates and gets thinner.: The cervix usually does not dilate or become thin. Follow these instructions at home:  Take over-the-counter and prescription medicines only as told by your health care provider.  Keep up with your usual exercises and follow other instructions from your health care provider.  Eat and drink lightly if you think you are going into labor.  If Braxton Hicks contractions are making you uncomfortable: ? Change your position from lying down or resting to walking, or change from walking to resting. ? Sit and rest in a tub of warm water. ? Drink enough fluid to keep your urine clear or pale yellow. Dehydration may cause these contractions. ?   Do slow and deep breathing several times an hour.  Keep all follow-up prenatal visits as told by your health care provider. This is important. Contact a health care provider if:  You have a fever.  You have continuous pain in your abdomen. Get help right away if:  Your contractions become stronger, more regular, and closer together.  You have fluid leaking or gushing from your vagina.  You  pass blood-tinged mucus (bloody show).  You have bleeding from your vagina.  You have low back pain that you never had before.  You feel your baby's head pushing down and causing pelvic pressure.  Your baby is not moving inside you as much as it used to. Summary  Contractions that occur before labor are called Braxton Hicks contractions, false labor, or practice contractions.  Braxton Hicks contractions are usually shorter, weaker, farther apart, and less regular than true labor contractions. True labor contractions usually become progressively stronger and regular and they become more frequent.  Manage discomfort from Braxton Hicks contractions by changing position, resting in a warm bath, drinking plenty of water, or practicing deep breathing. This information is not intended to replace advice given to you by your health care provider. Make sure you discuss any questions you have with your health care provider. Document Released: 07/08/2005 Document Revised: 05/27/2016 Document Reviewed: 05/27/2016 Elsevier Interactive Patient Education  2017 Elsevier Inc.  

## 2017-02-03 NOTE — Progress Notes (Signed)
High Risk Pregnancy Diagnosis(es): IUGR G3P1011 5268w0d Estimated Date of Delivery: 02/24/17 BP (!) 98/56   Pulse 82   Wt 137 lb (62.1 kg)   LMP 05/20/2016 (Approximate)   BMI 23.52 kg/m   Urinalysis: Negative HPI:  Doing well BP, weight, and urine reviewed.  Reports good fm. Denies regular uc's, lof, vb, uti s/s.  Craving ice, fingerstick Hgb 11.1 today  Fundal Height:  33 Fetal Heart rate:  145, reactive NST Edema:  None GBS, gc/ct collected Declines SVE, vtx by [facility]  Reviewed labor s/s, fkc All questions were answered Assessment: 2068w0d IUGR Medication(s) Plans:  n/a Treatment Plan:  2x/wk testing nst alt w/ bpp/dopp, IOL @ 39wks Follow up in 3d as [place] for high-risk OB appt and bpp/dopp u/s

## 2017-02-03 NOTE — Addendum Note (Signed)
Addended by: Tish FredericksonLANCASTER, Latoyia Tecson A on: 02/03/2017 12:25 PM   Modules accepted: Orders

## 2017-02-05 NOTE — Progress Notes (Signed)
EFM tracing on 02/05/2017 at 0229 to 0255 recorded on incorrect patient; patient Szczerba was not discharged out of [facility] before other patient admitted; EFM tracing belongs to Anna GenreGalloway, Makayla MRN: [id number]  g

## 2017-02-06 ENCOUNTER — Ambulatory Visit (INDEPENDENT_AMBULATORY_CARE_PROVIDER_SITE_OTHER): Payer: Medicaid Other | Admitting: Obstetrics & Gynecology

## 2017-02-06 ENCOUNTER — Encounter: Payer: Self-pay | Admitting: Obstetrics & Gynecology

## 2017-02-06 ENCOUNTER — Ambulatory Visit (INDEPENDENT_AMBULATORY_CARE_PROVIDER_SITE_OTHER): Payer: Medicaid Other

## 2017-02-06 VITALS — BP 132/78 | HR 77 | Wt 139.0 lb

## 2017-02-06 DIAGNOSIS — O0993 Supervision of high risk pregnancy, unspecified, third trimester: Secondary | ICD-10-CM | POA: Diagnosis not present

## 2017-02-06 DIAGNOSIS — Z3A37 37 weeks gestation of pregnancy: Secondary | ICD-10-CM

## 2017-02-06 DIAGNOSIS — Z331 Pregnant state, incidental: Secondary | ICD-10-CM

## 2017-02-06 DIAGNOSIS — Z1389 Encounter for screening for other disorder: Secondary | ICD-10-CM | POA: Diagnosis not present

## 2017-02-06 DIAGNOSIS — O09893 Supervision of other high risk pregnancies, third trimester: Secondary | ICD-10-CM | POA: Diagnosis not present

## 2017-02-06 DIAGNOSIS — O365930 Maternal care for other known or suspected poor fetal growth, third trimester, not applicable or unspecified: Secondary | ICD-10-CM

## 2017-02-06 LAB — POCT URINALYSIS DIPSTICK
Glucose, UA: NEGATIVE
Ketones, UA: NEGATIVE
LEUKOCYTES UA: NEGATIVE
NITRITE UA: NEGATIVE
PROTEIN UA: NEGATIVE
RBC UA: NEGATIVE

## 2017-02-06 LAB — GC/CHLAMYDIA PROBE AMP
CHLAMYDIA, DNA PROBE: POSITIVE — AB
Neisseria gonorrhoeae by PCR: NEGATIVE

## 2017-02-06 NOTE — Progress Notes (Signed)
US 37+3 wks,cephalic,RI .58,.64=72%,FHR 164 bpm,post pl gr 3,BPP 8/8,AFI 10 cm,

## 2017-02-06 NOTE — Progress Notes (Signed)
Fetal Surveillance Testing today:  BPP 8/8   High Risk Pregnancy Diagnosis(es):   FGR with AC<10%  G3P1011 [redacted]w[redacted]d Estimated Date of Delivery: 02/24/17  Blood pressure 132/78, pulse 77, weight 139 lb (63 kg), last menstrual period 05/20/2016.  Urinalysis: Negative   HPI: The patient is being seen today for ongoing management of as above. Today she reports no problems   BP weight and urine results all reviewed and noted. Patient reports good fetal movement, denies any bleeding and no rupture of membranes symptoms or regular contractions.  Fundal Height:  35 Fetal Heart rate:  144 Edema:  none  Patient is without complaints other than noted in her HPI. All questions were answered.  All lab and sonogram results have been reviewed. Comments:    Assessment:  1.  Pregnancy at 359w3d,  Estimated Date of Delivery: 02/24/17 :                          2.  FGR AC<10% with reassuring fetal surveillance, plan induction 39 weeks                        3.    Medication(s) Plans:  None, stopped prometrium  Treatment Plan:  Twice weekly surveillance with planned induction 39 weeks  Return in about 4 days (around 02/10/2017) for NST, HROB. for appointment for high risk OB care  No orders of the defined types were placed in this encounter.  Orders Placed This Encounter  Procedures  . POCT Urinalysis Dipstick

## 2017-02-07 ENCOUNTER — Telehealth: Payer: Self-pay | Admitting: Women's Health

## 2017-02-07 ENCOUNTER — Encounter: Payer: Self-pay | Admitting: Women's Health

## 2017-02-07 DIAGNOSIS — A749 Chlamydial infection, unspecified: Secondary | ICD-10-CM | POA: Insufficient documentation

## 2017-02-07 DIAGNOSIS — O98813 Other maternal infectious and parasitic diseases complicating pregnancy, third trimester: Secondary | ICD-10-CM

## 2017-02-07 MED ORDER — AZITHROMYCIN 500 MG PO TABS: 1000.0000 mg | ORAL_TABLET | Freq: Once | ORAL | 0 refills | Status: AC

## 2017-02-07 NOTE — Telephone Encounter (Signed)
Notified pt of +CT and +GBS. Rx azithromycin 1gm po x 1 for pt. She states partner can get tx elsewhere. No sex until at least 7d from both finished taking meds. POC in 3-4wks for pt. Communicable disease report sent.  Cheral MarkerKimberly R. Tarris Delbene, CNM, [facility]NP-BC 02/07/2017 3:48 PM

## 2017-02-08 LAB — STREP GP B SUSCEPTIBILITY

## 2017-02-08 LAB — STREP GP B NAA+RFLX: Strep Gp B NAA+Rflx: POSITIVE — AB

## 2017-02-09 ENCOUNTER — Encounter: Payer: Self-pay | Admitting: Women's Health

## 2017-02-10 ENCOUNTER — Encounter: Payer: Self-pay | Admitting: Obstetrics and Gynecology

## 2017-02-10 ENCOUNTER — Ambulatory Visit (INDEPENDENT_AMBULATORY_CARE_PROVIDER_SITE_OTHER): Payer: Medicaid Other | Admitting: Obstetrics and Gynecology

## 2017-02-10 VITALS — BP 112/66 | HR 82 | Wt 139.4 lb

## 2017-02-10 DIAGNOSIS — O09293 Supervision of pregnancy with other poor reproductive or obstetric history, third trimester: Secondary | ICD-10-CM

## 2017-02-10 DIAGNOSIS — Z1389 Encounter for screening for other disorder: Secondary | ICD-10-CM | POA: Diagnosis not present

## 2017-02-10 DIAGNOSIS — Z331 Pregnant state, incidental: Secondary | ICD-10-CM | POA: Diagnosis not present

## 2017-02-10 DIAGNOSIS — Z3A38 38 weeks gestation of pregnancy: Secondary | ICD-10-CM

## 2017-02-10 DIAGNOSIS — O0993 Supervision of high risk pregnancy, unspecified, third trimester: Secondary | ICD-10-CM | POA: Diagnosis not present

## 2017-02-10 LAB — POCT URINALYSIS DIPSTICK
Glucose, UA: NEGATIVE
KETONES UA: NEGATIVE
Nitrite, UA: NEGATIVE
PROTEIN UA: NEGATIVE
RBC UA: NEGATIVE

## 2017-02-10 NOTE — Progress Notes (Signed)
Robyn Willis is a 26 y.o. female  High Risk Pregnancy HROB Diagnosis(es):   FGR with AC<10%  G3P1011 5420w0d Estimated Date of Delivery: 02/24/17    HPI: The patient is being seen today for ongoing management of FGR with AC<10%. Plans are for induction of labor at 39 weeks or earlier when necessary biweekly testing being done Today she reports no pertinent complaints today. Pt is GBS positive. Patient reports good fetal movement, denies any bleeding and no rupture of membranes symptoms or regular contractions.   BP weight and urine results reviewed and noted. Last menstrual period 05/20/2016.  Fundal Height:  34cm Fetal Heart rate:  150 bpm, reactive Physical Examination: Abdomen - soft, nontender, nondistended, no masses or organomegaly                                     Pelvic - 3 cm, 50% thinned out, -1                                     Edema:  none  Urinalysis: 2+ leukocytes  Fetal Surveillance Testing today:  NST  Lab and sonogram results have been reviewed.   Assessment:  1.  Pregnancy at 5220w0d,  G3P1011   :  Estimated Date of Delivery: 02/24/17                        2. FGR AC<10% with reassuring fetal surveillance, plan induction 39 weeks, scheduled for 7:30 AM on 02/17/2017 patient where                          Medication(s) Plans:  none  Treatment Plan: Twice weekly surveillance with planned induction 39 weeks  Follow up in 3 days for appointment for high risk OB care for BPP and fetal growth , FGR with AC<10%  Induction of labor next Monday  By signing my name below, I, Izna Ahmed, attest that this documentation has been prepared under the direction and in the presence of Tilda BurrowFerguson, Becker Christopher V, MD. Electronically Signed: Redge GainerIzna Ahmed, ED Scribe. 02/10/17. 10:00 AM.  I personally performed the services described in this documentation, which was SCRIBED in my presence. The recorded information has been reviewed and considered accurate. It has been edited as necessary  during review. Tilda BurrowFERGUSON,Donata Reddick V, MD

## 2017-02-11 ENCOUNTER — Telehealth: Payer: Self-pay | Admitting: *Deleted

## 2017-02-11 ENCOUNTER — Telehealth: Payer: Self-pay | Admitting: Obstetrics and Gynecology

## 2017-02-11 ENCOUNTER — Encounter: Payer: Self-pay | Admitting: Obstetrics and Gynecology

## 2017-02-11 NOTE — Telephone Encounter (Signed)
Informed patient indication for induction was FGR<10%. Informed since her FH was off by 4cm, an ultrasound was done to assess weight in which it was found that her baby was <10%ile. Also informed if she went into labor, we would not stop her since she is term. Verbalized understanding.

## 2017-02-11 NOTE — Telephone Encounter (Signed)
Patient called stating that she would like a call back from either Dr. Emelda FearFerguson or his nurse, Patient did not state the nature of his call. Please contact pt

## 2017-02-11 NOTE — Telephone Encounter (Signed)
Returned call.  Unable to leave message.

## 2017-02-12 ENCOUNTER — Other Ambulatory Visit: Payer: Self-pay | Admitting: Obstetrics and Gynecology

## 2017-02-12 DIAGNOSIS — O365931 Maternal care for other known or suspected poor fetal growth, third trimester, fetus 1: Secondary | ICD-10-CM

## 2017-02-13 ENCOUNTER — Encounter (HOSPITAL_COMMUNITY): Payer: Self-pay | Admitting: *Deleted

## 2017-02-13 ENCOUNTER — Inpatient Hospital Stay (EMERGENCY_DEPARTMENT_HOSPITAL)
Admission: AD | Admit: 2017-02-13 | Discharge: 2017-02-13 | Disposition: A | Discharge status: Home / Self Care | Payer: Medicaid Other | Source: Ambulatory Visit | Attending: Obstetrics and Gynecology | Admitting: Obstetrics and Gynecology

## 2017-02-13 ENCOUNTER — Encounter: Payer: Self-pay | Admitting: Advanced Practice Midwife

## 2017-02-13 ENCOUNTER — Ambulatory Visit (INDEPENDENT_AMBULATORY_CARE_PROVIDER_SITE_OTHER): Payer: Medicaid Other | Admitting: Advanced Practice Midwife

## 2017-02-13 ENCOUNTER — Ambulatory Visit (INDEPENDENT_AMBULATORY_CARE_PROVIDER_SITE_OTHER): Payer: Medicaid Other

## 2017-02-13 VITALS — BP 120/90 | HR 76 | Wt 140.0 lb

## 2017-02-13 DIAGNOSIS — O471 False labor at or after 37 completed weeks of gestation: Secondary | ICD-10-CM

## 2017-02-13 DIAGNOSIS — O0990 Supervision of high risk pregnancy, unspecified, unspecified trimester: Secondary | ICD-10-CM

## 2017-02-13 DIAGNOSIS — Z3A38 38 weeks gestation of pregnancy: Secondary | ICD-10-CM

## 2017-02-13 DIAGNOSIS — Z1389 Encounter for screening for other disorder: Secondary | ICD-10-CM | POA: Diagnosis not present

## 2017-02-13 DIAGNOSIS — O365931 Maternal care for other known or suspected poor fetal growth, third trimester, fetus 1: Secondary | ICD-10-CM

## 2017-02-13 DIAGNOSIS — O365930 Maternal care for other known or suspected poor fetal growth, third trimester, not applicable or unspecified: Secondary | ICD-10-CM | POA: Diagnosis not present

## 2017-02-13 DIAGNOSIS — Z331 Pregnant state, incidental: Secondary | ICD-10-CM | POA: Diagnosis not present

## 2017-02-13 LAB — POCT URINALYSIS DIPSTICK
GLUCOSE UA: NEGATIVE
Ketones, UA: NEGATIVE
Nitrite, UA: NEGATIVE
RBC UA: NEGATIVE

## 2017-02-13 NOTE — MAU Note (Signed)
Pt reports contractions. States she has had a couple every hour. States she was in the office and was 4.5cm. Has had some pink vaginal spotting that started after dr appointment today. Reports good fetal movement. Denies LOF.

## 2017-02-13 NOTE — Progress Notes (Signed)
Fetal Surveillance Testing today:  US   High Risk Pregnancy Diagnosis(es):   IUGR, low AC  G3P1011 3140w3d Estimated Date of Delivery: 02/24/17  Blood pressure 120/90, pulse 76, weight 140 lb (63.5 kg), last menstrual period 05/20/2016.  Urinalysis: Negative   HPI: The patient is being seen today for ongoing management of the above. Today she reports some ctx this am but they stopped.     BP weight and urine results all reviewed and noted. Patient reports good fetal movement, denies any bleeding and no rupture of membranes symptoms or regular contractions. Patient is without complaints other than noted in her HPI. All questions were answered.  All lab and sonogram results have been reviewed. Comments:  US 38+3 wks,cephalic,fhr 153 bpm,afi 9.5 cm,BPP 8/8,post pl gr 3,RI .54,.56=52%,EFW 2974 g 24%,HC 4%,AC 13%  Assessment:  1.  Pregnancy at 7540w3d,  Estimated Date of Delivery: 02/24/17 :                          2.  FGR, low AC                        3.  Advanced cx dilation  Medication(s) Plans:  none  Treatment Plan:  IOL set for 7/30 at 7:30 am b JVF last week  Return 6 weeks for postpartum. No orders of the defined types were placed in this encounter.  Orders Placed This Encounter  Procedures  . POCT urinalysis dipstick

## 2017-02-13 NOTE — Progress Notes (Signed)
US 38+3 wks,cephalic,fhr 153 bpm,afi 9.5 cm,BPP 8/8,post pl gr 3,RI .54,.56=52%,EFW 2974 g 24%,HC 4%,AC 13%

## 2017-02-13 NOTE — MAU Provider Note (Signed)
History     CSN: [id number]  Arrival date and time: 02/13/17 1857   First Provider Initiated Contact with Patient 02/13/17 1938      Chief Complaint  Patient presents with  . Labor Eval   HPI 26 yo G3P1011 at 7684w3d here for the evaluation of contractions, pelvic pressure and vaginal bleeding. Patient reports onset of symptoms soon following her appointment earlier this afternoon. She reports noticing some blood when urinating and when she wiped. She did not need to wear a pad. She reports some irregular contractions but is most bothered by the pelvic pressure. Patient with prenatal care with [facility] complicated by fetal growth restrictions and short cervix. Patient reports good fetal movement. She denies leakage of fluid. Patient reports having a cervical exam earlier today  OB History    Gravida Para Term Preterm AB Living   3 1 1   1 1    SAB TAB Ectopic Multiple Live Births   0       1      Past Medical History:  Diagnosis Date  . Medical history non-contributory     Past Surgical History:  Procedure Laterality Date  . NO PAST SURGERIES      Family History  Problem Relation Age of Onset  . Kidney disease Mother   . Diabetes Mother   . Hypertension Mother   . Diabetes Father   . Hypertension Father   . Heart disease Maternal Grandmother   . Alzheimer's disease Paternal Grandmother     Social History  Substance Use Topics  . Smoking status: Former Smoker    Quit date: 07/16/2016  . Smokeless tobacco: Never Used  . Alcohol use No    Allergies:  Allergies  Allergen Reactions  . Penicillins Hives    Has patient had a PCN reaction causing immediate rash, facial/tongue/throat swelling, SOB or lightheadedness with hypotension: No Has patient had a PCN reaction causing severe rash involving mucus membranes or skin necrosis: No Has patient had a PCN reaction that required hospitalization No Has patient had a PCN reaction occurring within the last 10 years:  No If all of the above answers are "NO", then may proceed with Cephalosporin     Prescriptions Prior to Admission  Medication Sig Dispense Refill Last Dose  . Prenat w/o A-FeCbGl-DSS-FA-DHA (CITRANATAL ASSURE) 35-1 & 300 MG tablet One tablet and one capsule daily (Patient taking differently: Take 2 tablets by mouth daily. One tablet and one capsule daily) 60 tablet 11 02/12/2017 at Unknown time  . ranitidine (ZANTAC) 150 MG tablet Take 1 tablet (150 mg total) by mouth 2 (two) times daily. 60 tablet 6 Past Week at Unknown time    Review of Systems  See pertinent in HPI Physical Exam   Blood pressure 136/88, pulse 77, temperature 98.5 F (36.9 C), temperature source Oral, resp. rate 16, height 5\' 4"  (1.626 m), weight 140 lb (63.5 kg), last menstrual period 05/20/2016, SpO2 99 %.  Physical Exam  GENERAL: Well-developed, well-nourished female in no acute distress.  ABDOMEN: Soft, nontender, gravid CERVIX: 4.5/70/-3 EXTREMITIES: No cyanosis, clubbing, or edema, 2+ distal pulses.   MAU Course  Procedures  MDM FHT: baseline 150, mod variability, + accels, no decels Toco: irregular contractions  Assessment and Plan  26 yo G3P1011 at 9184w3d in latent labor - Repeat exam 1 hour later unchanged - Reassurance provided regarding vaginal bleeding which is secondary to earlier cervical exam - Labor precautions reviewed - If no labor, return to hospital on  7/30 for scheduled IOL   Shequita Peplinski 02/13/2017, 8:21 PM

## 2017-02-14 ENCOUNTER — Inpatient Hospital Stay (HOSPITAL_COMMUNITY)
Admission: AD | Admit: 2017-02-14 | Discharge: 2017-02-16 | DRG: 775 | Disposition: A | Discharge status: Home / Self Care | Payer: Medicaid Other | Source: Ambulatory Visit | Attending: Obstetrics and Gynecology | Admitting: Obstetrics and Gynecology

## 2017-02-14 ENCOUNTER — Inpatient Hospital Stay (HOSPITAL_COMMUNITY): Payer: Medicaid Other | Admitting: Anesthesiology

## 2017-02-14 ENCOUNTER — Encounter (HOSPITAL_COMMUNITY): Payer: Self-pay | Admitting: Emergency Medicine

## 2017-02-14 DIAGNOSIS — O26873 Cervical shortening, third trimester: Secondary | ICD-10-CM | POA: Diagnosis present

## 2017-02-14 DIAGNOSIS — O365930 Maternal care for other known or suspected poor fetal growth, third trimester, not applicable or unspecified: Secondary | ICD-10-CM | POA: Diagnosis present

## 2017-02-14 DIAGNOSIS — Z3A38 38 weeks gestation of pregnancy: Secondary | ICD-10-CM

## 2017-02-14 DIAGNOSIS — O99324 Drug use complicating childbirth: Secondary | ICD-10-CM | POA: Diagnosis present

## 2017-02-14 DIAGNOSIS — Z880 Allergy status to penicillin: Secondary | ICD-10-CM

## 2017-02-14 DIAGNOSIS — Z3493 Encounter for supervision of normal pregnancy, unspecified, third trimester: Secondary | ICD-10-CM | POA: Diagnosis present

## 2017-02-14 DIAGNOSIS — Z87891 Personal history of nicotine dependence: Secondary | ICD-10-CM | POA: Diagnosis not present

## 2017-02-14 DIAGNOSIS — F1290 Cannabis use, unspecified, uncomplicated: Secondary | ICD-10-CM | POA: Diagnosis present

## 2017-02-14 DIAGNOSIS — O99824 Streptococcus B carrier state complicating childbirth: Secondary | ICD-10-CM | POA: Diagnosis present

## 2017-02-14 DIAGNOSIS — Z3A39 39 weeks gestation of pregnancy: Secondary | ICD-10-CM | POA: Diagnosis not present

## 2017-02-14 LAB — TYPE AND SCREEN
ABO/RH(D): A POS
ANTIBODY SCREEN: NEGATIVE

## 2017-02-14 LAB — CBC
HCT: 35.1 % — ABNORMAL LOW (ref 36.0–46.0)
HEMOGLOBIN: 12 g/dL (ref 12.0–15.0)
MCH: 30.9 pg (ref 26.0–34.0)
MCHC: 34.2 g/dL (ref 30.0–36.0)
MCV: 90.5 fL (ref 78.0–100.0)
Platelets: 240 10*3/uL (ref 150–400)
RBC: 3.88 MIL/uL (ref 3.87–5.11)
RDW: 12.3 % (ref 11.5–15.5)
WBC: 15.4 10*3/uL — ABNORMAL HIGH (ref 4.0–10.5)

## 2017-02-14 LAB — RPR: RPR: NONREACTIVE

## 2017-02-14 LAB — ABO/RH: ABO/RH(D): A POS

## 2017-02-14 MED ORDER — SIMETHICONE 80 MG PO CHEW
80.0000 mg | CHEWABLE_TABLET | ORAL | Status: DC | PRN
Start: 2017-02-14 — End: 2017-02-16

## 2017-02-14 MED ORDER — LIDOCAINE HCL (PF) 1 % IJ SOLN
INTRAMUSCULAR | Status: DC | PRN
  Administered 2017-02-14: 4 mL via EPIDURAL

## 2017-02-14 MED ORDER — BISACODYL 10 MG RE SUPP: 10.0000 mg | Freq: Every day | RECTAL | Status: DC | PRN

## 2017-02-14 MED ORDER — FLEET ENEMA 7-19 GM/118ML RE ENEM: 1.0000 | ENEMA | Freq: Every day | RECTAL | Status: DC | PRN

## 2017-02-14 MED ORDER — IBUPROFEN 600 MG PO TABS
600.0000 mg | ORAL_TABLET | Freq: Four times a day (QID) | ORAL | Status: DC
  Administered 2017-02-14 – 2017-02-16 (×8): 600 mg via ORAL
  Filled 2017-02-14 (×10): qty 1

## 2017-02-14 MED ORDER — EPHEDRINE 5 MG/ML INJ
10.0000 mg | INTRAVENOUS | Status: DC | PRN
  Filled 2017-02-14: qty 2

## 2017-02-14 MED ORDER — WITCH HAZEL-GLYCERIN EX PADS: 1.0000 "application " | MEDICATED_PAD | CUTANEOUS | Status: DC | PRN

## 2017-02-14 MED ORDER — FENTANYL 2.5 MCG/ML BUPIVACAINE 1/10 % EPIDURAL INFUSION (WH - ANES)
INTRAMUSCULAR | Status: AC
  Filled 2017-02-14: qty 100

## 2017-02-14 MED ORDER — ACETAMINOPHEN 325 MG PO TABS
650.0000 mg | ORAL_TABLET | ORAL | Status: DC | PRN
Start: 2017-02-14 — End: 2017-02-16
  Filled 2017-02-14: qty 2

## 2017-02-14 MED ORDER — ZOLPIDEM TARTRATE 5 MG PO TABS: 5.0000 mg | ORAL_TABLET | Freq: Every evening | ORAL | Status: DC | PRN

## 2017-02-14 MED ORDER — ONDANSETRON HCL 4 MG/2ML IJ SOLN: 4.0000 mg | Freq: Four times a day (QID) | INTRAMUSCULAR | Status: DC | PRN

## 2017-02-14 MED ORDER — LACTATED RINGERS IV SOLN: 500.0000 mL | Freq: Once | INTRAVENOUS | Status: DC

## 2017-02-14 MED ORDER — METHYLERGONOVINE MALEATE 0.2 MG/ML IJ SOLN: 0.2000 mg | INTRAMUSCULAR | Status: DC | PRN

## 2017-02-14 MED ORDER — PHENYLEPHRINE 40 MCG/ML (10ML) SYRINGE FOR IV PUSH (FOR BLOOD PRESSURE SUPPORT)
PREFILLED_SYRINGE | INTRAVENOUS | Status: AC
  Administered 2017-02-14: 400 ug
  Filled 2017-02-14: qty 10

## 2017-02-14 MED ORDER — OXYTOCIN 40 UNITS IN LACTATED RINGERS INFUSION - SIMPLE MED
2.5000 [IU]/h | INTRAVENOUS | Status: DC
  Administered 2017-02-14: 2.5 [IU]/h via INTRAVENOUS

## 2017-02-14 MED ORDER — LIDOCAINE HCL (PF) 1 % IJ SOLN
INTRAMUSCULAR | Status: AC
  Filled 2017-02-14: qty 30

## 2017-02-14 MED ORDER — LIDOCAINE HCL (PF) 1 % IJ SOLN
30.0000 mL | INTRAMUSCULAR | Status: DC | PRN
  Filled 2017-02-14: qty 30

## 2017-02-14 MED ORDER — FERROUS SULFATE 325 (65 FE) MG PO TABS
325.0000 mg | ORAL_TABLET | Freq: Two times a day (BID) | ORAL | Status: DC
  Administered 2017-02-14 – 2017-02-16 (×3): 325 mg via ORAL
  Filled 2017-02-14 (×3): qty 1

## 2017-02-14 MED ORDER — BENZOCAINE-MENTHOL 20-0.5 % EX AERO
1.0000 "application " | INHALATION_SPRAY | CUTANEOUS | Status: DC | PRN
  Administered 2017-02-14: 1 via TOPICAL
  Filled 2017-02-14: qty 56

## 2017-02-14 MED ORDER — TETANUS-DIPHTH-ACELL PERTUSSIS 5-2.5-18.5 LF-MCG/0.5 IM SUSP: 0.5000 mL | Freq: Once | INTRAMUSCULAR | Status: DC

## 2017-02-14 MED ORDER — LACTATED RINGERS IV SOLN: INTRAVENOUS | Status: DC

## 2017-02-14 MED ORDER — DIBUCAINE 1 % RE OINT: 1.0000 "application " | TOPICAL_OINTMENT | RECTAL | Status: DC | PRN

## 2017-02-14 MED ORDER — DOCUSATE SODIUM 100 MG PO CAPS
100.0000 mg | ORAL_CAPSULE | Freq: Two times a day (BID) | ORAL | Status: DC
  Administered 2017-02-14 – 2017-02-16 (×4): 100 mg via ORAL
  Filled 2017-02-14 (×4): qty 1

## 2017-02-14 MED ORDER — FENTANYL 2.5 MCG/ML BUPIVACAINE 1/10 % EPIDURAL INFUSION (WH - ANES)
INTRAMUSCULAR | Status: DC | PRN
  Administered 2017-02-14: 5 mL/h via EPIDURAL

## 2017-02-14 MED ORDER — OXYTOCIN 40 UNITS IN LACTATED RINGERS INFUSION - SIMPLE MED
INTRAVENOUS | Status: AC
  Administered 2017-02-14: 500 mL via INTRAVENOUS
  Filled 2017-02-14: qty 1000

## 2017-02-14 MED ORDER — COCONUT OIL OIL: 1.0000 "application " | TOPICAL_OIL | Status: DC | PRN

## 2017-02-14 MED ORDER — SOD CITRATE-CITRIC ACID 500-334 MG/5ML PO SOLN: 30.0000 mL | ORAL | Status: DC | PRN

## 2017-02-14 MED ORDER — LACTATED RINGERS IV SOLN: 500.0000 mL | INTRAVENOUS | Status: DC | PRN

## 2017-02-14 MED ORDER — DIPHENHYDRAMINE HCL 25 MG PO CAPS: 25.0000 mg | ORAL_CAPSULE | Freq: Four times a day (QID) | ORAL | Status: DC | PRN

## 2017-02-14 MED ORDER — PHENYLEPHRINE 40 MCG/ML (10ML) SYRINGE FOR IV PUSH (FOR BLOOD PRESSURE SUPPORT)
80.0000 ug | PREFILLED_SYRINGE | INTRAVENOUS | Status: DC | PRN
  Filled 2017-02-14: qty 5

## 2017-02-14 MED ORDER — FENTANYL CITRATE (PF) 100 MCG/2ML IJ SOLN
INTRAMUSCULAR | Status: AC
  Administered 2017-02-14: 100 ug via INTRAVENOUS
  Filled 2017-02-14: qty 2

## 2017-02-14 MED ORDER — OXYTOCIN BOLUS FROM INFUSION
500.0000 mL | Freq: Once | INTRAVENOUS | Status: AC
  Administered 2017-02-14: 500 mL via INTRAVENOUS

## 2017-02-14 MED ORDER — VANCOMYCIN HCL IN DEXTROSE 1-5 GM/200ML-% IV SOLN
1000.0000 mg | Freq: Two times a day (BID) | INTRAVENOUS | Status: DC
  Administered 2017-02-14: 1000 mg via INTRAVENOUS
  Filled 2017-02-14: qty 200

## 2017-02-14 MED ORDER — CEFAZOLIN SODIUM-DEXTROSE 1-4 GM/50ML-% IV SOLN
1.0000 g | Freq: Three times a day (TID) | INTRAVENOUS | Status: DC
  Filled 2017-02-14: qty 50

## 2017-02-14 MED ORDER — METHYLERGONOVINE MALEATE 0.2 MG PO TABS: 0.2000 mg | ORAL_TABLET | ORAL | Status: DC | PRN

## 2017-02-14 MED ORDER — FENTANYL CITRATE (PF) 100 MCG/2ML IJ SOLN
50.0000 ug | INTRAMUSCULAR | Status: DC | PRN
  Administered 2017-02-14: 100 ug via INTRAVENOUS

## 2017-02-14 MED ORDER — ONDANSETRON HCL 4 MG/2ML IJ SOLN: 4.0000 mg | INTRAMUSCULAR | Status: DC | PRN

## 2017-02-14 MED ORDER — OXYCODONE HCL 5 MG PO TABS: 10.0000 mg | ORAL_TABLET | ORAL | Status: DC | PRN

## 2017-02-14 MED ORDER — ONDANSETRON HCL 4 MG PO TABS: 4.0000 mg | ORAL_TABLET | ORAL | Status: DC | PRN

## 2017-02-14 MED ORDER — PRENATAL MULTIVITAMIN CH
1.0000 | ORAL_TABLET | Freq: Every day | ORAL | Status: DC
  Administered 2017-02-15 – 2017-02-16 (×2): 1 via ORAL
  Filled 2017-02-14 (×2): qty 1

## 2017-02-14 MED ORDER — MEASLES, MUMPS & RUBELLA VAC ~~LOC~~ INJ
0.5000 mL | INJECTION | Freq: Once | SUBCUTANEOUS | Status: DC
  Filled 2017-02-14: qty 0.5

## 2017-02-14 MED ORDER — ACETAMINOPHEN 325 MG PO TABS: 650.0000 mg | ORAL_TABLET | ORAL | Status: DC | PRN

## 2017-02-14 MED ORDER — OXYCODONE HCL 5 MG PO TABS: 5.0000 mg | ORAL_TABLET | ORAL | Status: DC | PRN

## 2017-02-14 MED ORDER — CEFAZOLIN SODIUM-DEXTROSE 2-4 GM/100ML-% IV SOLN: 2.0000 g | Freq: Once | INTRAVENOUS | Status: DC

## 2017-02-14 NOTE — H&P (Signed)
LABOR AND DELIVERY ADMISSION HISTORY AND PHYSICAL NOTE  Robyn Willis is a 26 y.o. female G3P1011 with IUP at 4148w4d by LMP/11wk sono presenting for SOL.   She reports positive fetal movement. She denies leakage of fluid or vaginal bleeding. Patient deniesvision changes, chest pain, shortness of breath, or RUQ pain. Patient complaining of headache related to epidural placement.  Prenatal History/Complications: IUGR  Past Medical History: Past Medical History:  Diagnosis Date  . Medical history non-contributory     Past Surgical History: Past Surgical History:  Procedure Laterality Date  . NO PAST SURGERIES      Obstetrical History: OB History    Gravida Para Term Preterm AB Living   3 1 1   1 1    SAB TAB Ectopic Multiple Live Births   0       1      Social History: Social History   Social History  . Marital status: Single    Spouse name: N/A  . Number of children: N/A  . Years of education: N/A   Social History Main Topics  . Smoking status: Former Smoker    Quit date: 07/16/2016  . Smokeless tobacco: Never Used  . Alcohol use No  . Drug use: Yes    Types: Marijuana     Comment: a couple weeks ago  . Sexual activity: Yes    Birth control/ protection: None     Comment: a couple weeks ago   Other Topics Concern  . Not on file   Social History Narrative  . No narrative on file    Family History: Family History  Problem Relation Age of Onset  . Kidney disease Mother   . Diabetes Mother   . Hypertension Mother   . Diabetes Father   . Hypertension Father   . Heart disease Maternal Grandmother   . Alzheimer's disease Paternal Grandmother     Allergies: Allergies  Allergen Reactions  . Penicillins Hives    Has patient had a PCN reaction causing immediate rash, facial/tongue/throat swelling, SOB or lightheadedness with hypotension: No Has patient had a PCN reaction causing severe rash involving mucus membranes or skin necrosis: No Has patient had a  PCN reaction that required hospitalization No Has patient had a PCN reaction occurring within the last 10 years: No If all of the above answers are "NO", then may proceed with Cephalosporin     Prescriptions Prior to Admission  Medication Sig Dispense Refill Last Dose  . Prenat w/o A-FeCbGl-DSS-FA-DHA (CITRANATAL ASSURE) 35-1 & 300 MG tablet One tablet and one capsule daily (Patient taking differently: Take 2 tablets by mouth daily. One tablet and one capsule daily) 60 tablet 11 02/12/2017 at Unknown time  . ranitidine (ZANTAC) 150 MG tablet Take 1 tablet (150 mg total) by mouth 2 (two) times daily. 60 tablet 6 Past Week at Unknown time     Review of Systems   All systems reviewed and negative except as stated in HPI  Blood pressure (!) 146/90, pulse 78, temperature 97.6 F (36.4 C), temperature source Oral, last menstrual period 05/20/2016. General appearance: alert, cooperative, appears stated age and no distress Lungs: clear to auscultation bilaterally Heart: regular rate and rhythm Abdomen: soft, non-tender; bowel sounds normal Extremities: No calf swelling or tenderness Presentation: cephalic Fetal monitoring: 140/mod/-ac/-dc Uterine activity: Irregular Dilation: 6 Effacement (%): 70 Station: -1, -2 Exam by:: A Cioce, RN   Prenatal labs: ABO, Rh: A/Positive/-- (01/23 1627) Antibody: Negative (05/18 0906) Rubella: Immune RPR: Non Reactive (  05/18 0906)  HBsAg: Negative (01/23 1627)  HIV:  Nonreactive GBS: Positive  2 hr Glucola: Normal (83/145/131) Genetic screening:  Normal Anatomy US: Female, Rt CP cyst w/ neg nt/it   Repeat: resolved  Prenatal Transfer Tool  Maternal Diabetes: No Genetic Screening: Normal Maternal Ultrasounds/Referrals: Normal Fetal Ultrasounds or other Referrals:  None, Other:  Rt CP cyst w/ neg nt/it   Repeat: resolved Maternal Substance Abuse:  No Significant Maternal Medications:  None Significant Maternal Lab Results: Lab values include:  Group B Strep positive  Results for orders placed or performed during the hospital encounter of 02/14/17 (from the past 24 hour(s))  CBC   Collection Time: 02/14/17  6:00 AM  Result Value Ref Range   WBC 15.4 (H) 4.0 - 10.5 K/uL   RBC 3.88 3.87 - 5.11 MIL/uL   Hemoglobin 12.0 12.0 - 15.0 g/dL   HCT 16.135.1 (L) 09.636.0 - 04.546.0 %   MCV 90.5 78.0 - 100.0 fL   MCH 30.9 26.0 - 34.0 pg   MCHC 34.2 30.0 - 36.0 g/dL   RDW 40.912.3 81.111.5 - 91.415.5 %   Platelets 240 150 - 400 K/uL  Results for orders placed or performed in visit on 02/13/17 (from the past 24 hour(s))  POCT urinalysis dipstick   Collection Time: 02/13/17 12:15 PM  Result Value Ref Range   Color, UA     Clarity, UA     Glucose, UA neg    Bilirubin, UA     Ketones, UA neg    Spec Grav, UA  1.010 - 1.025   Blood, UA neg    pH, UA  5.0 - 8.0   Protein, UA trace    Urobilinogen, UA  0.2 or 1.0 E.U./dL   Nitrite, UA neg    Leukocytes, UA Moderate (2+) (A) Negative    Patient Active Problem List   Diagnosis Date Noted  . Normal labor 02/14/2017  . Chlamydia infection complicating pregnancy, third trimester 02/07/2017  . IUGR (intrauterine growth restriction) affecting care of mother Low AC <10%ile 01/08/2017  . Short cervix, antepartum 10/08/2016  . Hives 09/10/2016  . Marijuana use 08/15/2016  . Depression 08/13/2016  . Supervision of high risk pregnancy, antepartum 01/02/2016    Assessment: Robyn Willis is a 26 y.o. G3P1011 at 5436w4d here for SOL  #Labor: Expectant management #Pain: IV pain meds, epidural when ready #FWB:  Cat 1 #GBS: Positive, Vanc ordered (PCN allergy, clindamycin resistant) #MOF: Bottle #MOC: IUD #Circ: N/A  Conard NovakJoshua A Christian 02/14/2017, 6:19 AM   I have seen and examined this patient and agree with the management plan.

## 2017-02-14 NOTE — Anesthesia Procedure Notes (Signed)
Epidural Patient location during procedure: OB Start time: 02/14/2017 6:50 AM End time: 02/14/2017 6:56 AM  Staffing Anesthesiologist: Shona SimpsonHOLLIS, Aleeha Boline D Performed: anesthesiologist   Preanesthetic Checklist Completed: patient identified, site marked, surgical consent, pre-op evaluation, timeout performed, IV checked, risks and benefits discussed and monitors and equipment checked  Epidural Patient position: sitting Prep: ChloraPrep Patient monitoring: heart rate, continuous pulse ox and blood pressure Approach: midline Location: L3-L4 Injection technique: LOR saline  Needle:  Needle type: Tuohy  Needle gauge: 17 G Needle length: 9 cm Catheter type: closed end flexible Catheter size: 20 Guage Test dose: negative and 1.5% lidocaine  Assessment Events: blood not aspirated, injection not painful, no injection resistance and no paresthesia  Additional Notes LOR @ 4  After LOR, pt moved but no CSF fluid noted. Epidural threaded easily and test dose given. Pt moved extremities but said they were numb within 5 minutes. No CSF aspirated. Lower infusion dose for precaution. Information relayed at sign out. Low threshold for consideration of PDPH.   Patient identified. Risks/Benefits/Options discussed with patient including but not limited to bleeding, infection, nerve damage, paralysis, failed block, incomplete pain control, headache, blood pressure changes, nausea, vomiting, reactions to medications, itching and postpartum back pain. Confirmed with bedside nurse the patient's most recent platelet count. Confirmed with patient that they are not currently taking any anticoagulation, have any bleeding history or any family history of bleeding disorders. Patient expressed understanding and wished to proceed. All questions were answered. Sterile technique was used throughout the entire procedure. Please see nursing notes for vital signs. Test dose was given through epidural catheter and negative  prior to continuing to dose epidural or start infusion. Warning signs of high block given to the patient including shortness of breath, tingling/numbness in hands, complete motor block, or any concerning symptoms with instructions to call for help. Patient was given instructions on fall risk and not to get out of bed. All questions and concerns addressed with instructions to call with any issues or inadequate analgesia.    Reason for block:procedure for pain

## 2017-02-14 NOTE — Anesthesia Preprocedure Evaluation (Signed)
Anesthesia Evaluation  Patient identified by MRN, date of birth, ID band Patient awake    Reviewed: Allergy & Precautions, Patient's Chart, lab work & pertinent test results  Airway Mallampati: I       Dental no notable dental hx.    Pulmonary neg pulmonary ROS, former smoker,    Pulmonary exam normal        Cardiovascular negative cardio ROS Normal cardiovascular exam     Neuro/Psych PSYCHIATRIC DISORDERS Depression negative neurological ROS     GI/Hepatic negative GI ROS, Neg liver ROS,   Endo/Other  negative endocrine ROS  Renal/GU negative Renal ROS     Musculoskeletal negative musculoskeletal ROS (+)   Abdominal   Peds  Hematology negative hematology ROS (+)   Anesthesia Other Findings Day of surgery medications reviewed with the patient.  Reproductive/Obstetrics (+) Pregnancy                             Lab Results  Component Value Date   WBC 15.4 (H) 02/14/2017   HGB 12.0 02/14/2017   HCT 35.1 (L) 02/14/2017   MCV 90.5 02/14/2017   PLT 240 02/14/2017     Anesthesia Physical Anesthesia Plan  ASA: II  Anesthesia Plan: Epidural   Post-op Pain Management:    Induction:   PONV Risk Score and Plan:   Airway Management Planned:   Additional Equipment:   Intra-op Plan:   Post-operative Plan:   Informed Consent: I have reviewed the patients History and Physical, chart, labs and discussed the procedure including the risks, benefits and alternatives for the proposed anesthesia with the patient or authorized representative who has indicated his/her understanding and acceptance.     Plan Discussed with:   Anesthesia Plan Comments:         Anesthesia Quick Evaluation

## 2017-02-14 NOTE — MAU Note (Addendum)
PT  SAYS SHE WAS HERE YESTERDAY -  VE 4-5  CM    ARRIVED VIA  EMS

## 2017-02-14 NOTE — Anesthesia Pain Management Evaluation Note (Signed)
  CRNA Pain Management Visit Note  Patient: Robyn Willis, 26 y.o., female  "[facility]. We have an anesthesia team available at all times to provide care throughout the hospital, including epidural management and anesthesia for C-section. I don't know your plan for the delivery whether it a natural birth, water birth, IV sedation, nitrous supplementation, doula or epidural, but we want to meet your pain goals."   1.Was your pain managed to your expectations on prior hospitalizations?   Yes   2.What is your expectation for pain management during this hospitalization?     Epidural  3.How can we help you reach that goal?   Record the patient's initial score and the patient's pain goal.   Pain: 0  Pain Goal: 5 [facility] wants you to be able to say your pain was always managed very well.  Laban EmperorMalinova,Robyn Willis 02/14/2017

## 2017-02-15 DIAGNOSIS — Z3A39 39 weeks gestation of pregnancy: Secondary | ICD-10-CM

## 2017-02-15 DIAGNOSIS — O99824 Streptococcus B carrier state complicating childbirth: Secondary | ICD-10-CM

## 2017-02-15 MED ORDER — IBUPROFEN 600 MG PO TABS: 600.0000 mg | ORAL_TABLET | Freq: Four times a day (QID) | ORAL | 0 refills | Status: DC

## 2017-02-15 NOTE — Clinical Social Work Maternal (Signed)
CLINICAL SOCIAL WORK MATERNAL/CHILD NOTE  Patient Details  Name: Robyn Willis MRN: [id number] Date of Birth: 02/27/1991  Date:  02/15/2017  Clinical Social Worker Initiating Note:  Ferdinand Lango Ward, MSW, LCSW-A  Date/ Time Initiated:  02/15/17/1201     Child's Name:  Robyn Willis    Legal Guardian:  Other (Comment) (Not established by court system; MOB and FOB Robyn Willis DOB 03/13/91) co-parent in [place] holds. )   Need for Interpreter:  None   Date of Referral:  02/14/17     Reason for Referral:  Current Substance Use/Substance Use During Pregnancy , Other (Comment) (MOB hx of PPD/depression )   Referral Source:      Address:  [place] [address]  Phone number:  [phone]   Household Members:  Self, Parents, Minor Children (MOB and baby live with MOB's parents and MOB's older daughter Robyn Willis))   Natural Supports (not living in the home):  Spouse/significant other   Professional Supports: None   Employment: Unemployed   Type of Work: MOB unemployed currently    Education:  Database administrator Resources:  [employer]   Other Resources:  [employer], Food Stamps    Cultural/Religious Considerations Which May Impact Care:  None reported.   Strengths:  Ability to meet basic needs , Compliance with medical plan , Home prepared for child , Pediatrician chosen  [facility] )   Risk Factors/Current Problems:  Substance Use    Cognitive State:  Alert , Able to Concentrate , Goal Oriented , Insightful    Mood/Affect:  Calm , Happy , Interested , Comfortable    CSW Assessment: CSW met with MOB at bedside to complete assessment for consult regarding hx of depression/PPD and THC use. Upon this writer arrival, MOB was warm and welcoming. This Probation officer explained role and reasoning for visit. MOB verbalized understanding and was in agreement to continue with assessment. CSW inquired about  MOB's depression hx. MOB confirms her hx of depression and notes she experienced PPD after her 49 year old daughter and then experienced it again prior to confirming this pregnancy and then continued throughout this pregnancy. MOB notes all her depression hx is related to her relationship with her girls FOB. MOB notes she and FOB are not currently together but are cordial. She notes "he has a lot of growing up to do and it was stressful at times but since he is away in [facility] it has been 100% better". CSW inquired if FOB relocated to [facility] for good. MOB notes he is just visiting but even when he is in [place] they have limited contact unless its concerning the kids. This Probation officer praised MOB for being able to reorganize her triggers and how to cope with her depression. CSW encouraged MOB to continue practicing self-care and only engaging FOB when she has to regarding the kids in an effort to ensure she is not triggered. CSW encouraged MOB to seek medical attention should her depressive symptoms onset again. MOB is not interested in medication at this time. CSW inquired about substance use hx. MOB notes she uses substance recreationally and denies the use for substance abuse resources. CSW explained the hospitals policy and procedure to [facility] regarding substance use and mandated reporting for positive screens. CSW inquired of MOB's last use. MOB was fourth coming noting a few weeks ago. MOB further notes it is due to not being hungry and not wanting to eat. This Probation officer expressed to  MOB because of her recent use, it is likely baby's CDS will be (+) warranting a CPS report. MOB was understanding and asked appropriate questions about that process. CSW explained report will be made and then CPS will follow-up with her to complete a safety assessment of the home and make a determination following. MOB verbalized understanding and expressed no other questions or concerns. At this time, no other needs addressed or requested. Case closed to  this CSW.   CSW will continue to monitor CDS results.   CSW Plan/Description:  Information/Referral to [employer] , No Further Intervention Required/No Barriers to Discharge, Patient/Family Education , Other (Comment) (CSW will continue to follow pending CDS results and make a report for positive screens)    Oda Cogan, MSW, [facility]  Office: [phone]

## 2017-02-15 NOTE — Discharge Instructions (Signed)

## 2017-02-15 NOTE — Discharge Summary (Signed)
OB Discharge Summary     Patient Name: Robyn Willis DOB: 11-02-1990 MRN: [id number]  Date of admission: 02/14/2017 Delivering MD: Derry LoryHRISTIAN, JOSHUA A   Date of discharge: 02/15/2017  Admitting diagnosis: 38 WEEKS CTX Intrauterine pregnancy: 5652w4d     Secondary diagnosis:  Principal Problem:   SVD (spontaneous vaginal delivery) Active Problems:   Normal labor  Additional problems: IUGR     Discharge diagnosis: Term Pregnancy Delivered                                                                                                Post partum procedures:none  Augmentation: none  Complications: None  Hospital course:  Onset of Labor With Vaginal Delivery     26 y.o. yo [id number] at 8652w4d was admitted in Active Labor on 02/14/2017. Patient had an uncomplicated labor course as follows:  Membrane Rupture Time/Date: 8:23 AM ,02/14/2017   Intrapartum Procedures: Episiotomy: None [1]                                         Lacerations:  None [1]  Patient had a delivery of a Viable infant. 02/14/2017  Information for the patient's newborn:  Luiz BlareGraves, Girl [name] [[id number]][[id number]]  Delivery Method: Vaginal, Spontaneous Delivery (Filed from Delivery Summary)    Pateint had an uncomplicated postpartum course.  She is ambulating, tolerating a regular diet, passing flatus, and urinating well. Patient is discharged home in stable condition on 02/15/17.   Physical exam  Vitals:   02/14/17 1119 02/14/17 1521 02/14/17 1852 02/15/17 0502  BP: (!) 144/78 129/75 (!) 146/87 137/86  Pulse: 70 72 91 (!) 59  Resp: 20 18 20 20   Temp: 97.8 F (36.6 C) 98.4 F (36.9 C) 98 F (36.7 C) 98.1 F (36.7 C)  TempSrc: Oral Oral Oral Oral  SpO2:   100%    General: alert, cooperative and no distress Lochia: appropriate Uterine Fundus: firm Incision: N/A DVT Evaluation: No evidence of DVT seen on physical exam. No significant calf/ankle edema. Labs: Lab Results  Component Value Date   WBC 15.4 (H)  02/14/2017   HGB 12.0 02/14/2017   HCT 35.1 (L) 02/14/2017   MCV 90.5 02/14/2017   PLT 240 02/14/2017   CMP Latest Ref Rng & Units 01/28/2017  Glucose 65 - 99 mg/dL 77  BUN 6 - 20 mg/dL <1(H<3(L)  Creatinine 0.860.44 - 1.00 mg/dL 5.78(I0.40(L)  Sodium 696135 - 295145 mmol/L 138  Potassium 3.5 - 5.1 mmol/L 3.7  Chloride 101 - 111 mmol/L 105  CO2 22 - 32 mmol/L -  Calcium 8.9 - 10.3 mg/dL -  Total Protein 6.5 - 8.1 g/dL -  Total Bilirubin 0.3 - 1.2 mg/dL -  Alkaline Phos 38 - 284126 U/L -  AST 15 - 41 U/L -  ALT 14 - 54 U/L -    Discharge instruction: per After Visit Summary and "Baby and Me Booklet".  After visit meds:  Allergies as of 02/15/2017      Reactions  Penicillins Hives   Has patient had a PCN reaction causing immediate rash, facial/tongue/throat swelling, SOB or lightheadedness with hypotension: No Has patient had a PCN reaction causing severe rash involving mucus membranes or skin necrosis: No Has patient had a PCN reaction that required hospitalization No Has patient had a PCN reaction occurring within the last 10 years: No If all of the above answers are "NO", then may proceed with Cephalosporin       Medication List    TAKE these medications   CITRANATAL ASSURE 35-1 & 300 MG tablet One tablet and one capsule daily What changed:  how much to take  how to take this  when to take this  additional instructions   ibuprofen 600 MG tablet Commonly known as:  ADVIL,MOTRIN Take 1 tablet (600 mg total) by mouth every 6 (six) hours.   ranitidine 150 MG tablet Commonly known as:  ZANTAC Take 1 tablet (150 mg total) by mouth 2 (two) times daily.       Diet: routine diet  Activity: Advance as tolerated. Pelvic rest for 6 weeks.   Outpatient follow up:6 weeks Follow up Appt:No future appointments. Follow up Visit:No Follow-up on file.  Postpartum contraception: IUD Mirena  Newborn Data: Live born female  Birth Weight: 6 lb 7.4 oz (2931 g) APGAR: 9, 9  Baby Feeding:  Bottle Disposition:home with mother   02/15/2017 Frederik PearJulie P Raneshia Derick, MD

## 2017-02-15 NOTE — Progress Notes (Signed)
POSTPARTUM PROGRESS NOTE  Post Partum Day 1 Subjective:  Robyn Willis is a 26 y.o. [id number] 4176w4d s/p SVD.  No acute events overnight.  Pt denies problems with ambulating, voiding or po intake.  She denies nausea or vomiting.  Pain is moderately controlled.  She has had flatus. She has not had bowel movement.  Lochia Small. Pt reports feeling very sleepy and would like to go home today if possible   Objective: Blood pressure 137/86, pulse (!) 59, temperature 98.1 F (36.7 C), temperature source Oral, resp. rate 20, last menstrual period 05/20/2016, SpO2 100 %, unknown if currently breastfeeding.  Physical Exam:  General: alert, cooperative and no distress Chest: CTAB Heart: RRR no m/r/g Abdomen: +BS, soft, nontender,  Uterine Fundus: firm DVT Evaluation: No calf swelling or tenderness Extremities: trace edema   Recent Labs  02/14/17 0600  HGB 12.0  HCT 35.1*    Assessment/Plan:  ASSESSMENT: Robyn Willis is a 26 y.o. [id number] 476w4d s/p NSVD  Discharge home   LOS: 1 day   Sullivan LoneBrannon L Inman, Medical Student   OB FELLOW MEDICAL STUDENT NOTE ATTESTATION  I confirm that I have verified the information documented in the medical student's note and that I have also personally performed the physical exam and all medical decision making activities.   Frederik PearJulie P Degele, MD OB Fellow 02/15/2017, 2:03 PM

## 2017-02-16 NOTE — Anesthesia Postprocedure Evaluation (Signed)
Anesthesia Post Note  Patient: Robyn Willis  Procedure(s) Performed: * No procedures listed *     Patient location during evaluation: Mother Baby Anesthesia Type: Epidural Level of consciousness: awake and alert Pain management: pain level controlled Vital Signs Assessment: post-procedure vital signs reviewed and stable Respiratory status: spontaneous breathing, nonlabored ventilation and respiratory function stable Cardiovascular status: stable Postop Assessment: no headache, no backache and epidural receding Anesthetic complications: no    Last Vitals:  Vitals:   02/15/17 1837 02/16/17 0520  BP: (!) 141/88 134/68  Pulse: 67 69  Resp: 12 18  Temp: 36.8 C     Last Pain:  Vitals:   02/16/17 0520  TempSrc: Oral  PainSc: 0-No pain   Pain Goal:                 Jiles GarterJACKSON,Alvar Malinoski EDWARD

## 2017-02-16 NOTE — Discharge Summary (Deleted)
OB Discharge Summary     Patient Name: Robyn Willis DOB: December 23, 1990 MRN: [id number]  Date of admission: 02/14/2017 Delivering MD: Derry LoryHRISTIAN, Elianie Hubers A   Date of discharge: 02/16/2017  Admitting diagnosis: 38 WEEKS CTX Intrauterine pregnancy: 3078w4d     Secondary diagnosis:  Principal Problem:   SVD (spontaneous vaginal delivery) Active Problems:   Normal labor  Additional problems: None     Discharge diagnosis: Term Pregnancy Delivered                                                                                                Post partum procedures:None  Augmentation: None  Complications: None  Hospital course:  Onset of Labor With Vaginal Delivery     26 y.o. yo [id number] at 3478w4d was admitted in Active Labor on 02/14/2017. Patient had an uncomplicated labor course as follows:  Membrane Rupture Time/Date: 8:23 AM ,02/14/2017   Intrapartum Procedures: Episiotomy: None [1]                                         Lacerations:  None [1]  Patient had a delivery of a Viable infant. 02/14/2017  Information for the patient's newborn:  Robyn Willis, Girl Robyn Willis [[id number]][[id number]]  Delivery Method: Vaginal, Spontaneous Delivery (Filed from Delivery Summary)    Pateint had an uncomplicated postpartum course.  She is ambulating, tolerating a regular diet, passing flatus, and urinating well. Patient denied headache, vision changes, chest pain, shortness of breath, or RUQ pain the morning of discharge. Patient is discharged home in stable condition on 02/16/17.   Physical exam  Vitals:   02/14/17 1852 02/15/17 0502 02/15/17 1837 02/16/17 0520  BP: (!) 146/87 137/86 (!) 141/88 134/68  Pulse: 91 (!) 59 67 69  Resp: 20 20 12 18   Temp: 98 F (36.7 C) 98.1 F (36.7 C) 98.2 F (36.8 C)   TempSrc: Oral Oral Oral Oral  SpO2: 100%  100%    General: alert, cooperative and no distress Lochia: appropriate Uterine Fundus: firm Incision: N/A DVT Evaluation: No evidence of DVT seen on physical  exam. Labs: Lab Results  Component Value Date   WBC 15.4 (H) 02/14/2017   HGB 12.0 02/14/2017   HCT 35.1 (L) 02/14/2017   MCV 90.5 02/14/2017   PLT 240 02/14/2017   CMP Latest Ref Rng & Units 01/28/2017  Glucose 65 - 99 mg/dL 77  BUN 6 - 20 mg/dL <1(H<3(L)  Creatinine 0.860.44 - 1.00 mg/dL 5.78(I0.40(L)  Sodium 696135 - 295145 mmol/L 138  Potassium 3.5 - 5.1 mmol/L 3.7  Chloride 101 - 111 mmol/L 105  CO2 22 - 32 mmol/L -  Calcium 8.9 - 10.3 mg/dL -  Total Protein 6.5 - 8.1 g/dL -  Total Bilirubin 0.3 - 1.2 mg/dL -  Alkaline Phos 38 - 284126 U/L -  AST 15 - 41 U/L -  ALT 14 - 54 U/L -    Discharge instruction: per After Visit Summary and "Baby and Me Booklet".  After visit meds:  Allergies  as of 02/16/2017      Reactions   Penicillins Hives   Has patient had a PCN reaction causing immediate rash, facial/tongue/throat swelling, SOB or lightheadedness with hypotension: No Has patient had a PCN reaction causing severe rash involving mucus membranes or skin necrosis: No Has patient had a PCN reaction that required hospitalization No Has patient had a PCN reaction occurring within the last 10 years: No If all of the above answers are "NO", then may proceed with Cephalosporin       Medication List    TAKE these medications   CITRANATAL ASSURE 35-1 & 300 MG tablet One tablet and one capsule daily What changed:  how much to take  how to take this  when to take this  additional instructions   ibuprofen 600 MG tablet Commonly known as:  ADVIL,MOTRIN Take 1 tablet (600 mg total) by mouth every 6 (six) hours.   ranitidine 150 MG tablet Commonly known as:  ZANTAC Take 1 tablet (150 mg total) by mouth 2 (two) times daily.       Diet: routine diet  Activity: Advance as tolerated. Pelvic rest for 6 weeks.   Outpatient follow up:6 weeks with [facility] Postpartum contraception: IUD Mirena  Newborn Data: Live born female  Birth Weight: 6 lb 7.4 oz (2931 g) APGAR: 9, 9  Baby Feeding:  Bottle Disposition:home with mother   02/16/2017 Robyn NovakJoshua A Farhaan Mabee, MD

## 2017-02-16 NOTE — Discharge Summary (Signed)
OB Discharge Summary     Patient Name: Robyn Willis DOB: 1991/04/13 MRN: [id number]  Date of admission: 02/14/2017 Delivering MD: Derry LoryHRISTIAN, JOSHUA A   Date of discharge: 02/16/2017  Admitting diagnosis: 38 WEEKS CTX Intrauterine pregnancy: 4877w4d     Secondary diagnosis:  Principal Problem:   SVD (spontaneous vaginal delivery) Active Problems:   Normal labor  Additional problems: IUGR     Discharge diagnosis: Term Pregnancy Delivered                                                                                                Post partum procedures:none  Augmentation: none  Complications: None  Hospital course:  Onset of Labor With Vaginal Delivery     26 y.o. yo [id number] at 3577w4d was admitted in Active Labor on 02/14/2017. Patient had an uncomplicated labor course as follows:  Membrane Rupture Time/Date: 8:23 AM ,02/14/2017   Intrapartum Procedures: Episiotomy: None [1]                                         Lacerations:  None [1]  Patient had a delivery of a Viable infant. 02/14/2017  Information for the patient's newborn:  Luiz BlareGraves, Girl [name] [[id number]][[id number]]  Delivery Method: Vaginal, Spontaneous Delivery (Filed from Delivery Summary)    Pateint had an uncomplicated postpartum course.  She is ambulating, tolerating a regular diet, passing flatus, and urinating well. Patient is discharged home in stable condition on 02/16/17.   Physical exam  Vitals:   02/14/17 1852 02/15/17 0502 02/15/17 1837 02/16/17 0520  BP: (!) 146/87 137/86 (!) 141/88 134/68  Pulse: 91 (!) 59 67 69  Resp: 20 20 12 18   Temp: 98 F (36.7 C) 98.1 F (36.7 C) 98.2 F (36.8 C)   TempSrc: Oral Oral Oral Oral  SpO2: 100%  100%    General: alert, cooperative and no distress Lochia: appropriate Uterine Fundus: firm Incision: N/A DVT Evaluation: No evidence of DVT seen on physical exam. No significant calf/ankle edema. Labs: Lab Results  Component Value Date   WBC 15.4 (H) 02/14/2017   HGB  12.0 02/14/2017   HCT 35.1 (L) 02/14/2017   MCV 90.5 02/14/2017   PLT 240 02/14/2017   CMP Latest Ref Rng & Units 01/28/2017  Glucose 65 - 99 mg/dL 77  BUN 6 - 20 mg/dL <1(H<3(L)  Creatinine 0.860.44 - 1.00 mg/dL 5.78(I0.40(L)  Sodium 696135 - 295145 mmol/L 138  Potassium 3.5 - 5.1 mmol/L 3.7  Chloride 101 - 111 mmol/L 105  CO2 22 - 32 mmol/L -  Calcium 8.9 - 10.3 mg/dL -  Total Protein 6.5 - 8.1 g/dL -  Total Bilirubin 0.3 - 1.2 mg/dL -  Alkaline Phos 38 - 284126 U/L -  AST 15 - 41 U/L -  ALT 14 - 54 U/L -    Discharge instruction: per After Visit Summary and "Baby and Me Booklet".  After visit meds:  Allergies as of 02/16/2017      Reactions   Penicillins Hives  Has patient had a PCN reaction causing immediate rash, facial/tongue/throat swelling, SOB or lightheadedness with hypotension: No Has patient had a PCN reaction causing severe rash involving mucus membranes or skin necrosis: No Has patient had a PCN reaction that required hospitalization No Has patient had a PCN reaction occurring within the last 10 years: No If all of the above answers are "NO", then may proceed with Cephalosporin       Medication List    TAKE these medications   CITRANATAL ASSURE 35-1 & 300 MG tablet One tablet and one capsule daily What changed:  how much to take  how to take this  when to take this  additional instructions   ibuprofen 600 MG tablet Commonly known as:  ADVIL,MOTRIN Take 1 tablet (600 mg total) by mouth every 6 (six) hours.   ranitidine 150 MG tablet Commonly known as:  ZANTAC Take 1 tablet (150 mg total) by mouth 2 (two) times daily.       Diet: routine diet  Activity: Advance as tolerated. Pelvic rest for 6 weeks.   Outpatient follow up:6 weeks Follow up Appt:No future appointments. Follow up Visit:No Follow-up on file.  Postpartum contraception: IUD Mirena  Newborn Data: Live born female  Birth Weight: 6 lb 7.4 oz (2931 g) APGAR: 9, 9  Baby Feeding:  Bottle Disposition:home with mother   02/16/2017 Frederik PearJulie P Degele, MD

## 2017-02-27 ENCOUNTER — Encounter: Payer: Self-pay | Admitting: Women's Health

## 2017-02-28 ENCOUNTER — Telehealth: Payer: Self-pay | Admitting: *Deleted

## 2017-02-28 NOTE — Telephone Encounter (Signed)
Spoke with pt. Pt delivered 02/14/17. Pt states her bleeding has picked up and has a foul odor. I advised it could be her period. Advised to give it over the weekend and see how she does. Pt not having any pain with urination and hasn't had fever within the last few days. Pt states she had fever when she first came home from hospital, but she had mastitis.  I advised to see how she does over weekend, and if she needs to be seen, call office Monday.  Pt voiced understanding. JSY

## 2017-03-04 ENCOUNTER — Telehealth: Payer: Self-pay | Admitting: Advanced Practice Midwife

## 2017-03-04 MED ORDER — FLUCONAZOLE 150 MG PO TABS: ORAL_TABLET | ORAL | 2 refills | Status: DC

## 2017-03-04 NOTE — Telephone Encounter (Signed)
I called her in a diflucan

## 2017-03-18 ENCOUNTER — Ambulatory Visit: Payer: Medicaid Other | Admitting: Advanced Practice Midwife

## 2017-03-21 ENCOUNTER — Ambulatory Visit: Payer: Medicaid Other | Admitting: Women's Health

## 2017-03-25 ENCOUNTER — Encounter: Payer: Self-pay | Admitting: *Deleted

## 2017-03-25 ENCOUNTER — Ambulatory Visit: Payer: Self-pay | Admitting: Adult Health

## 2017-04-01 ENCOUNTER — Ambulatory Visit: Payer: Self-pay | Admitting: Advanced Practice Midwife

## 2017-04-08 ENCOUNTER — Ambulatory Visit: Payer: Self-pay | Admitting: Women's Health

## 2017-04-10 ENCOUNTER — Ambulatory Visit: Payer: Self-pay | Admitting: Advanced Practice Midwife

## 2017-04-10 ENCOUNTER — Encounter: Payer: Self-pay | Admitting: Obstetrics & Gynecology

## 2017-04-11 ENCOUNTER — Encounter: Payer: Self-pay | Admitting: *Deleted

## 2017-05-01 ENCOUNTER — Telehealth: Payer: Self-pay | Admitting: *Deleted

## 2017-05-01 ENCOUNTER — Other Ambulatory Visit: Payer: Self-pay | Admitting: Advanced Practice Midwife

## 2017-05-01 ENCOUNTER — Encounter: Payer: Self-pay | Admitting: Advanced Practice Midwife

## 2017-05-01 MED ORDER — METRONIDAZOLE 0.75 % VA GEL
1.0000 | Freq: Every day | VAGINAL | 0 refills | Status: DC
Start: 2017-05-01 — End: 2017-10-16

## 2017-05-01 NOTE — Progress Notes (Signed)
metrogel for bv

## 2017-05-01 NOTE — Telephone Encounter (Signed)
Patient states she is having cloudy/grayish vaginal discharge and foul odor. Has had BV in the and would like something for it again if possible. Please advise.

## 2017-05-12 ENCOUNTER — Emergency Department (HOSPITAL_COMMUNITY)
Admission: EM | Admit: 2017-05-12 | Discharge: 2017-05-12 | Disposition: A | Discharge status: Home / Self Care | Payer: Self-pay

## 2017-05-12 NOTE — ED Notes (Signed)
Called pt from waiting room to triage with no answer.  Registration state pt said she had to step out to her car for a moment

## 2017-05-12 NOTE — ED Notes (Signed)
Pt told registration she had to leave, she had an emergency

## 2017-07-22 ENCOUNTER — Emergency Department (HOSPITAL_COMMUNITY)
Admission: EM | Admit: 2017-07-22 | Discharge: 2017-07-22 | Disposition: A | Discharge status: Home / Self Care | Payer: Self-pay

## 2017-07-22 NOTE — ED Triage Notes (Signed)
Called for pt x 3, pt not in waiting room.

## 2017-07-22 NOTE — ED Notes (Signed)
Called for triage, no answer

## 2017-10-16 ENCOUNTER — Ambulatory Visit
Admission: EM | Admit: 2017-10-16 | Discharge: 2017-10-16 | Disposition: A | Discharge status: Home / Self Care | Payer: Medicaid Other | Attending: Family Medicine | Admitting: Family Medicine

## 2017-10-16 ENCOUNTER — Other Ambulatory Visit: Payer: Self-pay

## 2017-10-16 ENCOUNTER — Encounter: Payer: Self-pay | Admitting: Emergency Medicine

## 2017-10-16 DIAGNOSIS — F411 Generalized anxiety disorder: Secondary | ICD-10-CM

## 2017-10-16 DIAGNOSIS — R110 Nausea: Secondary | ICD-10-CM | POA: Diagnosis not present

## 2017-10-16 HISTORY — DX: Anxiety disorder, unspecified: F41.9

## 2017-10-16 MED ORDER — SERTRALINE HCL 50 MG PO TABS: 50.0000 mg | ORAL_TABLET | Freq: Every day | ORAL | 1 refills | Status: DC

## 2017-10-16 NOTE — ED Provider Notes (Signed)
[facility]  CSN: [id number] Arrival date & time: 10/16/17  1613  History   Chief Complaint Chief Complaint  Patient presents with  . Nausea  . Fatigue   HPI  27 year old female presents with the above complaints.  Patient states that for the past few days she is felt fatigued, and nauseated.  She has ongoing anxiety.  She states that she is not sleeping well and generally does not feel well.  She reports intermittent dizziness as well.  Dizziness occurs primarily with abrupt movements.  Patient states that she feels like she is suffering predominantly from anxiety.  No fever.  No respiratory symptoms.  No known relieving factors.  No other associated symptoms.  No other complaints at this time.  PMH: Patient Active Problem List   Diagnosis Date Noted  . SVD (spontaneous vaginal delivery) 02/15/2017  . Normal labor 02/14/2017  . Chlamydia infection complicating pregnancy, third trimester 02/07/2017  . IUGR (intrauterine growth restriction) affecting care of mother Low AC <10%ile 01/08/2017  . Short cervix, antepartum 10/08/2016  . Hives 09/10/2016  . Marijuana use 08/15/2016  . Depression 08/13/2016  . Supervision of high risk pregnancy, antepartum 01/02/2016   Past Surgical History:  Procedure Laterality Date  . NO PAST SURGERIES      OB History    Gravida  3   Para  2   Term  2   Preterm      AB  1   Living  2     SAB  0   TAB      Ectopic      Multiple  0   Live Births  2          Home Medications    Prior to Admission medications   Medication Sig Start Date End Date Taking? Authorizing Provider  sertraline (ZOLOFT) 50 MG tablet Take 1 tablet (50 mg total) by mouth daily. 10/16/17   Tommie Sams, DO   Family History Family History  Problem Relation Age of Onset  . Kidney disease Mother   . Diabetes Mother   . Hypertension Mother   . Diabetes Father   . Hypertension Father   . Heart disease Maternal Grandmother   .  Alzheimer's disease Paternal Grandmother    Social History Social History   Tobacco Use  . Smoking status: Former Smoker    Last attempt to quit: 07/16/2016    Years since quitting: 1.2  . Smokeless tobacco: Never Used  Substance Use Topics  . Alcohol use: No  . Drug use: Not Currently    Types: Marijuana    Comment: a couple weeks ago   Allergies   Penicillins   Review of Systems Review of Systems Per HPI  Physical Exam Triage Vital Signs ED Triage Vitals [10/16/17 1624]  Enc Vitals Group     BP 110/66     Pulse Rate 68     Resp 16     Temp 98.4 F (36.9 C)     Temp Source Oral     SpO2 100 %     Weight 115 lb (52.2 kg)     Height 5\' 4"  (1.626 m)     Head Circumference      Peak Flow      Pain Score 0     Pain Loc      Pain Edu?      Excl. in GC?    Updated Vital Signs BP 110/66 (BP Location: Left Arm)  Pulse 68   Temp 98.4 F (36.9 C) (Oral)   Resp 16   Ht 5\' 4"  (1.626 m)   Wt 115 lb (52.2 kg)   LMP 09/29/2017   SpO2 100%   BMI 19.74 kg/m   Physical Exam  Constitutional: She is oriented to person, place, and time. She appears well-developed. No distress.  HENT:  Head: Normocephalic and atraumatic.  Cardiovascular: Normal rate and regular rhythm.  Pulmonary/Chest: Effort normal and breath sounds normal.  Neurological: She is alert and oriented to person, place, and time.  Psychiatric: She has a normal mood and affect. Her behavior is normal.  Nursing note and vitals reviewed.  UC Treatments / Results  Labs (all labs ordered are listed, but only abnormal results are displayed) Labs Reviewed - No data to display  EKG None Radiology No results found.  Procedures Procedures (including critical care time)  Medications Ordered in UC Medications - No data to display   Initial Impression / Assessment and Plan / UC Course  I have reviewed the triage vital signs and the nursing notes.  Pertinent labs & imaging results that were available  during my care of the patient were reviewed by me and considered in my medical decision making (see chart for details).     27 year old female presents with vague complaints.  Patient likely suffering from anxiety and possibly depression.  Has a history of depression.  She is not pregnant.  She has had menses recently.  Well-appearing.  We had a long conversation about this.  Trial of Zoloft.  Needs to find a primary care physician.  Final Clinical Impressions(s) / UC Diagnoses   Final diagnoses:  Anxiety state  Nausea without vomiting    ED Discharge Orders        Ordered    sertraline (ZOLOFT) 50 MG tablet  Daily     10/16/17 1646     Controlled Substance Prescriptions [place] Controlled Substance Registry consulted? Not Applicable   Tommie SamsCook, Daejon Lich G, DO 10/16/17 1717

## 2017-10-16 NOTE — Discharge Instructions (Signed)
Please get yourself a primary doctor.  Medication as prescribed.  Take care  Dr. Adriana Simasook

## 2017-10-16 NOTE — ED Triage Notes (Signed)
Patient in today c/o extreme fatigue x 1 week. Patient also complains of nausea x 3 days. Patient states she has anxiety and she has a 758 month old at home.

## 2017-11-17 DIAGNOSIS — R109 Unspecified abdominal pain: Secondary | ICD-10-CM | POA: Diagnosis not present

## 2017-11-17 DIAGNOSIS — N3000 Acute cystitis without hematuria: Secondary | ICD-10-CM | POA: Diagnosis not present

## 2017-11-17 DIAGNOSIS — R091 Pleurisy: Secondary | ICD-10-CM | POA: Diagnosis not present

## 2018-09-29 ENCOUNTER — Encounter (HOSPITAL_COMMUNITY): Payer: Self-pay | Admitting: *Deleted

## 2018-09-29 ENCOUNTER — Emergency Department (HOSPITAL_COMMUNITY)
Admission: EM | Admit: 2018-09-29 | Discharge: 2018-09-30 | Disposition: A | Discharge status: Home / Self Care | Payer: Self-pay | Attending: Emergency Medicine | Admitting: Emergency Medicine

## 2018-09-29 ENCOUNTER — Emergency Department (HOSPITAL_COMMUNITY): Payer: Self-pay

## 2018-09-29 ENCOUNTER — Other Ambulatory Visit: Payer: Self-pay

## 2018-09-29 DIAGNOSIS — J111 Influenza due to unidentified influenza virus with other respiratory manifestations: Secondary | ICD-10-CM | POA: Insufficient documentation

## 2018-09-29 DIAGNOSIS — R69 Illness, unspecified: Secondary | ICD-10-CM

## 2018-09-29 DIAGNOSIS — Z87891 Personal history of nicotine dependence: Secondary | ICD-10-CM | POA: Insufficient documentation

## 2018-09-29 LAB — URINALYSIS, ROUTINE W REFLEX MICROSCOPIC
BACTERIA UA: NONE SEEN
BILIRUBIN URINE: NEGATIVE
Glucose, UA: NEGATIVE mg/dL
HGB URINE DIPSTICK: NEGATIVE
KETONES UR: NEGATIVE mg/dL
NITRITE: NEGATIVE
PH: 6 (ref 5.0–8.0)
Protein, ur: NEGATIVE mg/dL
SPECIFIC GRAVITY, URINE: 1.013 (ref 1.005–1.030)

## 2018-09-29 NOTE — ED Triage Notes (Signed)
Pt states she started running a fever today of 103; pt is c/o some abdominal pain and urinary frequency and urgency; pt is also c/o having chills with sore throat and cough

## 2018-09-29 NOTE — ED Notes (Signed)
PT refused flu swab D/T nose being tender. Dr Lynelle Doctor informred

## 2018-09-30 MED ORDER — OSELTAMIVIR PHOSPHATE 75 MG PO CAPS: 75.0000 mg | ORAL_CAPSULE | Freq: Two times a day (BID) | ORAL | 0 refills | Status: DC

## 2018-09-30 NOTE — Discharge Instructions (Addendum)
Drink plenty of fluids.  Take ibuprofen 400 mg and/or acetaminophen 650 mg every 6 hours as needed for body aches or fever.  You can take Mucinex DM over-the-counter for cough.  Recheck if you struggle to breathe, start having vomiting or diarrhea or you feel like you are getting dehydrated.  You should not return to school until your fever is gone for at least 24 hours.

## 2018-09-30 NOTE — ED Provider Notes (Signed)
[facility] EMERGENCY DEPARTMENT Provider Note   CSN: [id number] Arrival date & time: 09/29/18  2121  Time seen 11:12 PM  History   Chief Complaint Chief Complaint  Patient presents with  . Fever    HPI Laqueta LindenShavon D Covin is a 28 y.o. female.     HPI patient states this morning she started having chills and her temperature was 100.3 (not 103 as documented in triage).  She has had a scratchy throat, she states her throat was sore earlier but a cough drops helped.  She has been having clear rhinorrhea and sneezing, some coughing with rare clear fluid.  She states she has had some nausea without vomiting.  She states it "hurts when I hold my Pee" for class for the past week.  She states she had an upset stomach and is been having soft stool for the past couple days about 3 times a day.  She denies having diarrhea.  She states she took the flu shot this season.  She states her mother has also had a cough but she is doing better.  [facility]   Past Medical History:  Diagnosis Date  . Anxiety    self reported  . Medical history non-contributory     Patient Active Problem List   Diagnosis Date Noted  . SVD (spontaneous vaginal delivery) 02/15/2017  . Normal labor 02/14/2017  . Chlamydia infection complicating pregnancy, third trimester 02/07/2017  . IUGR (intrauterine growth restriction) affecting care of mother Low AC <10%ile 01/08/2017  . Short cervix, antepartum 10/08/2016  . Hives 09/10/2016  . Marijuana use 08/15/2016  . Depression 08/13/2016  . Supervision of high risk pregnancy, antepartum 01/02/2016    Past Surgical History:  Procedure Laterality Date  . NO PAST SURGERIES       OB History    Gravida  3   Para  2   Term  2   Preterm      AB  1   Living  2     SAB  0   TAB      Ectopic      Multiple  0   Live Births  2            Home Medications    Prior to Admission medications   Medication Sig Start Date  End Date Taking? Authorizing Provider  oseltamivir (TAMIFLU) 75 MG capsule Take 1 capsule (75 mg total) by mouth every 12 (twelve) hours. 09/30/18   Devoria AlbeKnapp, Caresse Sedivy, MD    Family History Family History  Problem Relation Age of Onset  . Kidney disease Mother   . Diabetes Mother   . Hypertension Mother   . Diabetes Father   . Hypertension Father   . Heart disease Maternal Grandmother   . Alzheimer's disease Paternal Grandmother     Social History Social History   Tobacco Use  . Smoking status: Former Smoker    Last attempt to quit: 07/16/2016    Years since quitting: 2.2  . Smokeless tobacco: Never Used  Substance Use Topics  . Alcohol use: No  . Drug use: Not Currently    Types: Marijuana    Comment: a couple weeks ago  pt is in CNA school   Allergies   Penicillins   Review of Systems Review of Systems  All other systems reviewed and are negative.    Physical Exam Updated Vital Signs BP (!) 145/93 (BP Location: Right Arm)   Pulse (!) 105  Temp 99 F (37.2 C) (Oral)   Resp 16   Ht 5\' 4"  (1.626 m)   Wt 49.9 kg   LMP 08/26/2018   SpO2 100%   BMI 18.88 kg/m   Vital signs normal except for borderline tachycardia   Physical Exam Vitals signs and nursing note reviewed.  Constitutional:      Appearance: Normal appearance. She is normal weight.  HENT:     Head: Normocephalic and atraumatic.     Right Ear: External ear normal.     Left Ear: External ear normal.     Nose: Nose normal.     Mouth/Throat:     Mouth: Mucous membranes are moist.     Pharynx: No oropharyngeal exudate or posterior oropharyngeal erythema.  Eyes:     Extraocular Movements: Extraocular movements intact.     Conjunctiva/sclera: Conjunctivae normal.     Pupils: Pupils are equal, round, and reactive to light.  Neck:     Musculoskeletal: Normal range of motion and neck supple.  Cardiovascular:     Rate and Rhythm: Normal rate and regular rhythm.     Heart sounds: No murmur.    Pulmonary:     Effort: Pulmonary effort is normal. No respiratory distress.     Breath sounds: Normal breath sounds.  Abdominal:     General: Abdomen is flat. Bowel sounds are normal.     Palpations: Abdomen is soft.     Tenderness: There is abdominal tenderness in the suprapubic area.       Comments: She does not have CVA tenderness on either side  Musculoskeletal: Normal range of motion.        General: No deformity.  Skin:    General: Skin is warm and dry.     Findings: No erythema.  Neurological:     General: No focal deficit present.     Mental Status: She is alert and oriented to person, place, and time.     Cranial Nerves: No cranial nerve deficit.  Psychiatric:        Mood and Affect: Mood normal.        Behavior: Behavior normal.        Thought Content: Thought content normal.      ED Treatments / Results  Labs (all labs ordered are listed, but only abnormal results are displayed) Results for orders placed or performed during the hospital encounter of 09/29/18  Urinalysis, Routine w reflex microscopic  Result Value Ref Range   Color, Urine AMBER (A) YELLOW   APPearance CLEAR CLEAR   Specific Gravity, Urine 1.013 1.005 - 1.030   pH 6.0 5.0 - 8.0   Glucose, UA NEGATIVE NEGATIVE mg/dL   Hgb urine dipstick NEGATIVE NEGATIVE   Bilirubin Urine NEGATIVE NEGATIVE   Ketones, ur NEGATIVE NEGATIVE mg/dL   Protein, ur NEGATIVE NEGATIVE mg/dL   Nitrite NEGATIVE NEGATIVE   Leukocytes,Ua TRACE (A) NEGATIVE   RBC / HPF 0-5 0 - 5 RBC/hpf   WBC, UA 0-5 0 - 5 WBC/hpf   Bacteria, UA NONE SEEN NONE SEEN   Squamous Epithelial / LPF 0-5 0 - 5   Mucus PRESENT    Laboratory interpretation all normal    EKG None  Radiology Dg Chest 2 View  Result Date: 09/30/2018 CLINICAL DATA:  Fever and cough EXAM: CHEST - 2 VIEW COMPARISON:  11/17/2017 FINDINGS: The heart size and mediastinal contours are within normal limits. Both lungs are clear. The visualized skeletal structures are  unremarkable. IMPRESSION: No active cardiopulmonary  disease. Electronically Signed   By: Jasmine Pang M.D.   On: 09/30/2018 00:08    Procedures Procedures (including critical care time)  Medications Ordered in ED Medications - No data to display   Initial Impression / Assessment and Plan / ED Course  I have reviewed the triage vital signs and the nursing notes.  Pertinent labs & imaging results that were available during my care of the patient were reviewed by me and considered in my medical decision making (see chart for details).       Patient stated she wanted to be tested for the flu however then she refused to have the nurse obtain the sample.  She had a chest x-ray to make sure she did not have a pneumonia.  I suspect she is having a flulike illness.  Patient's chest x-ray is negative for pneumonia.  She was discharged home with a prescription for Tamiflu and symptomatic care.  Final Clinical Impressions(s) / ED Diagnoses   Final diagnoses:  Influenza-like illness    ED Discharge Orders         Ordered    oseltamivir (TAMIFLU) 75 MG capsule  Every 12 hours     09/30/18 0020        OTC ibuprofen and acetaminophen  Plan discharge  Devoria Albe, MD, Concha Pyo, MD 09/30/18 [phone]

## 2019-03-25 IMAGING — US US MFM OB TRANSVAGINAL
1 series · 13 of 13 positions shown · non-contrast
Comparison: none

[Series 1: us mfm ob transvaginal · 13 of 13 slices shown]
[im 1/13]
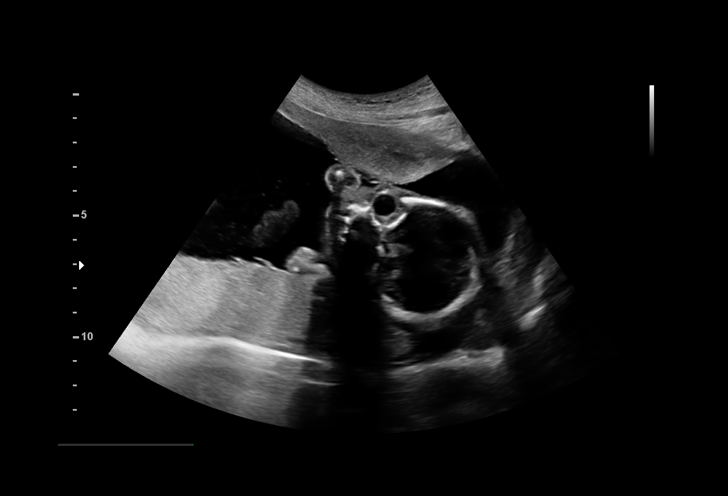
[im 2/13]
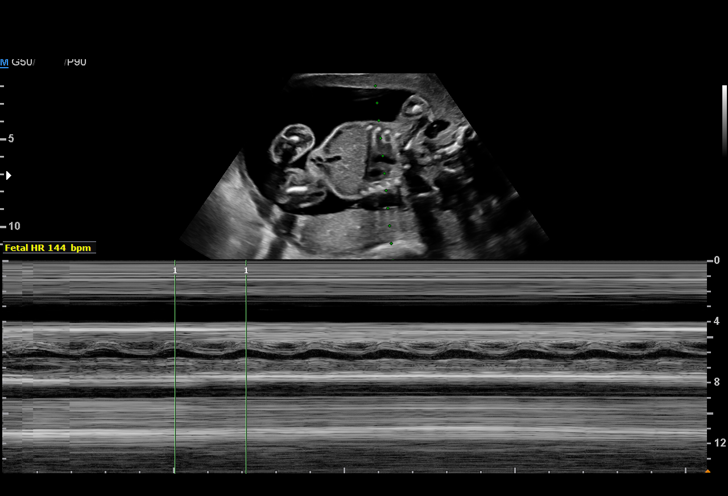
[im 3/13]
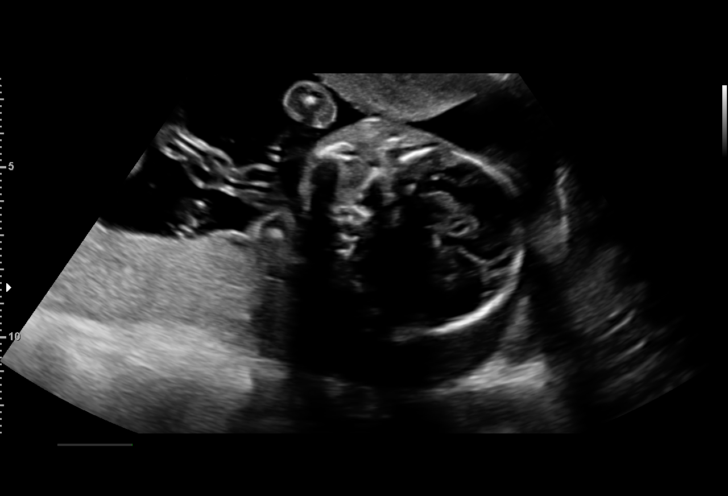
[im 4/13]
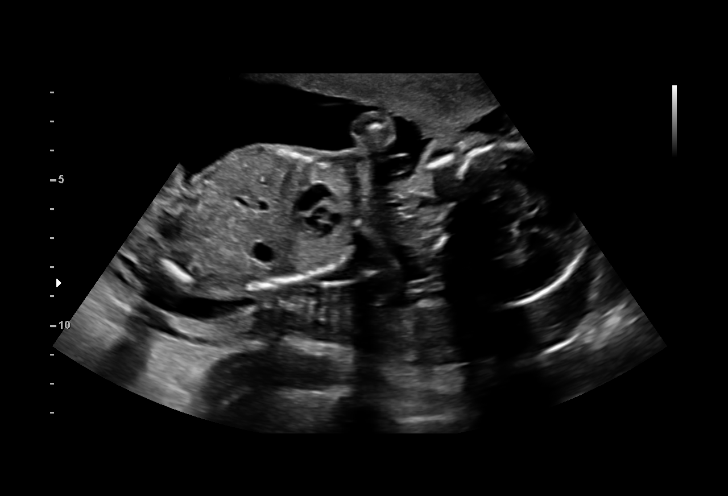
[im 5/13]
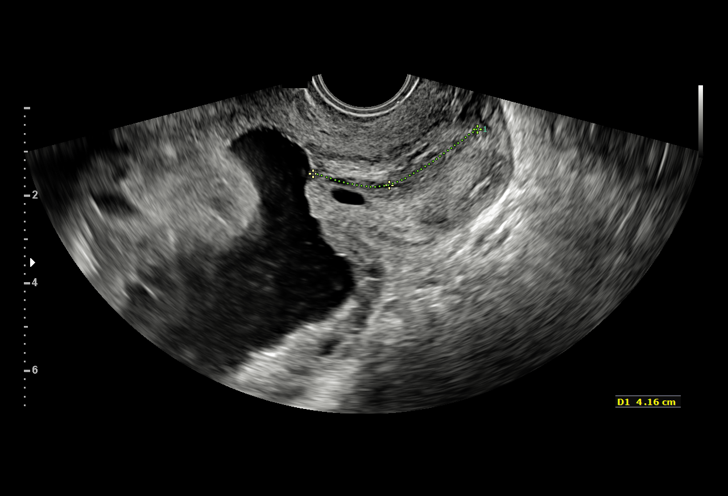
[im 6/13]
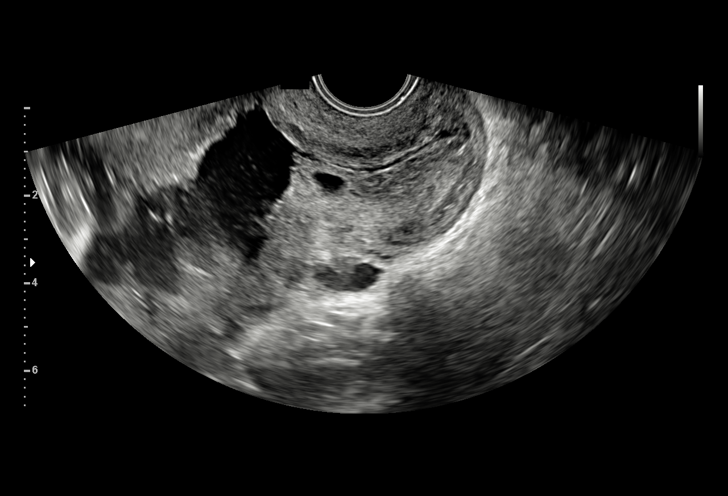
[im 7/13]
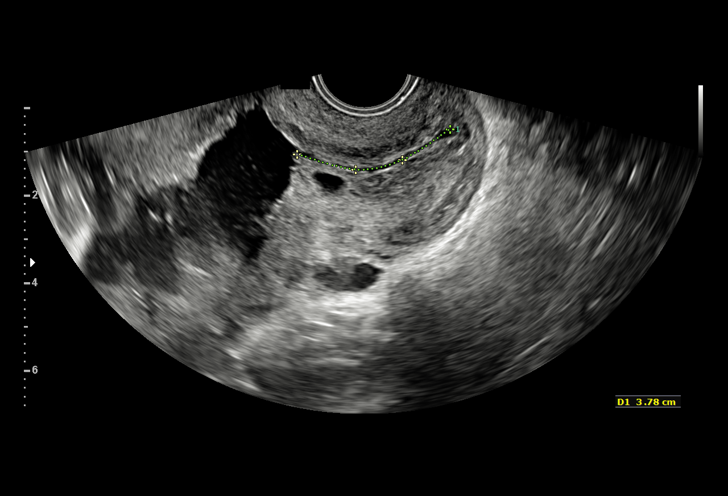
[im 8/13]
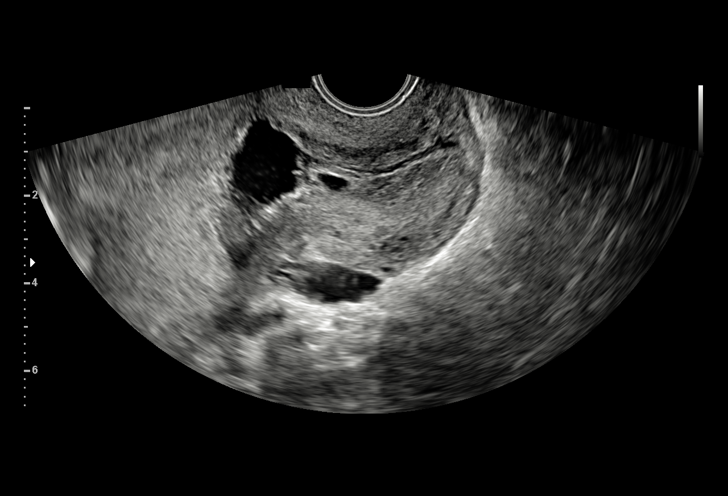
[im 9/13]
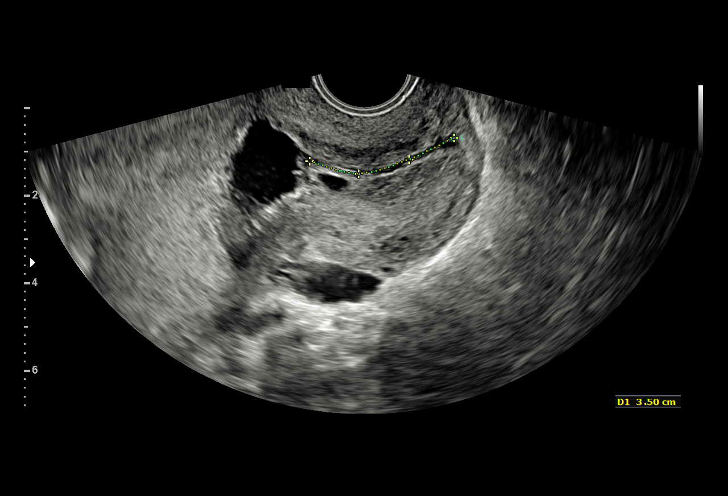
[im 10/13]
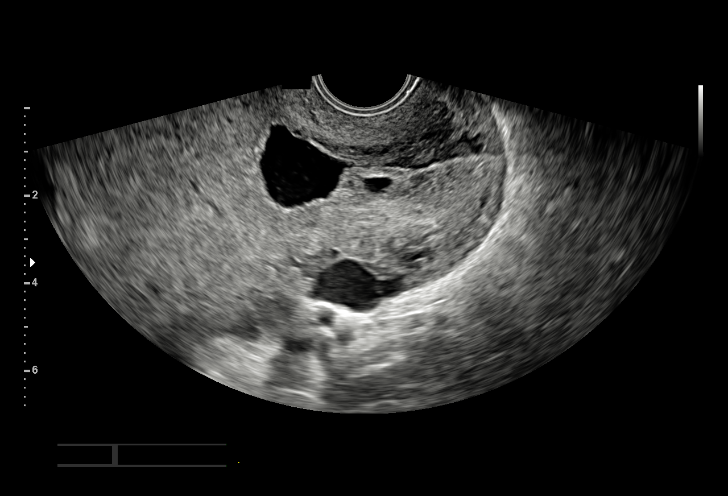
[im 11/13]
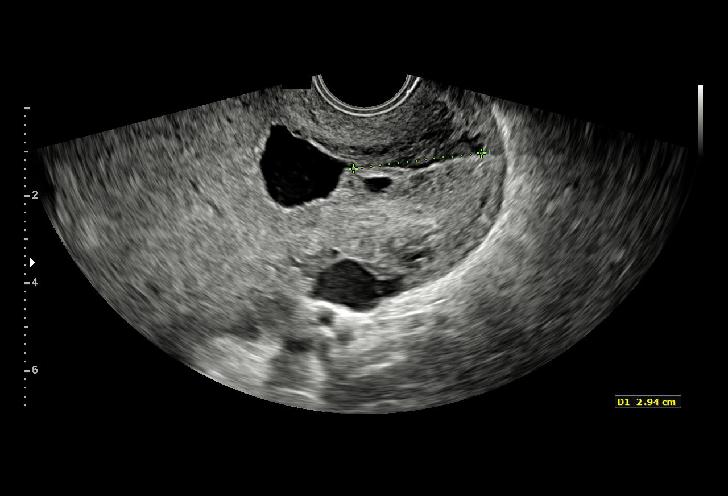
[im 12/13]
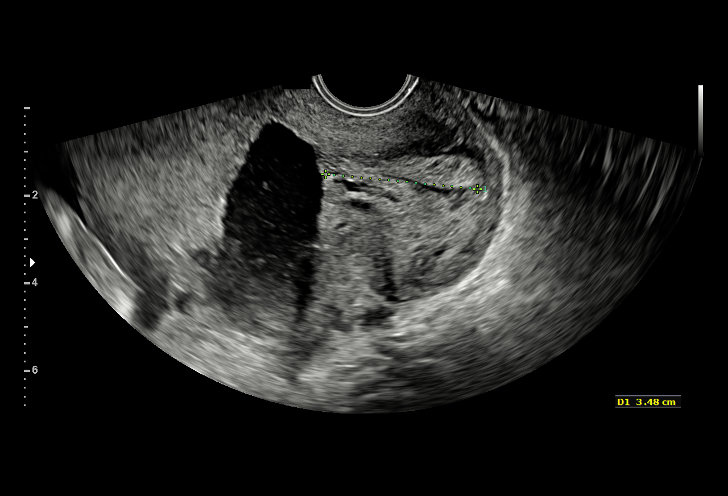
[im 13/13]
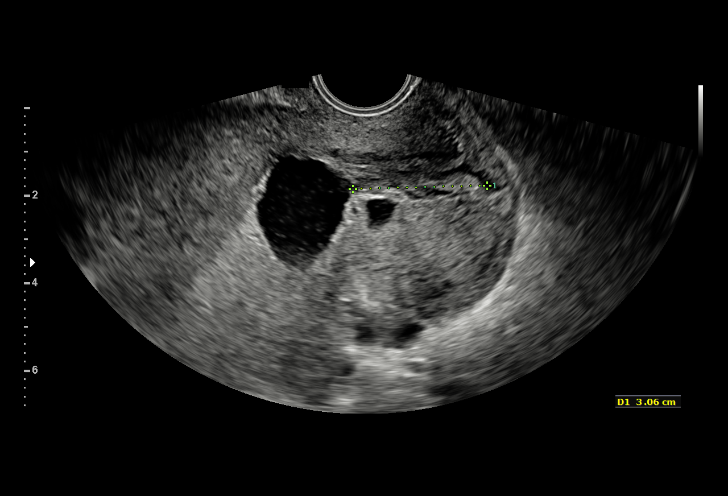

[13 of 13 positions shown; findings below may reference images not displayed]

1  DANICA JANE JAKARIA             [id number]      [id number]     [id number]
Indications

21 weeks gestation of pregnancy
Abdominal pain in pregnancy
Cervical shortening, second trimester (from
outside US) 1.8-2.6cm
17 P started 10-08-16
OB History

Gravidity:    4         Term:   1         SAB:   2
Fetal Evaluation

Num Of Fetuses:     1
Fetal Heart         144
Rate(bpm):
Cardiac Activity:   Observed
Presentation:       Cephalic

Amniotic Fluid
AFI FV:      Subjectively within normal limits
Gestational Age

LMP:           21w 5d        Date:  05/20/16                 EDD:   02/24/17
Best:          21w 5d     Det. By:  LMP  (05/20/16)          EDD:   02/24/17
Cervix Uterus Adnexa

Cervix
Length:            4.1  cm.
With fundal pressure 3.5cm.    Normal appearance by transvaginal
scan
Impression

SIUP at 23w2d (remote read only)
active singleton fetus
amniotic fluid is gestational age appropriate
cervix is long and closed
Recommendations

Follow up as clinically indicated.

## 2019-04-26 ENCOUNTER — Ambulatory Visit: Payer: Self-pay | Admitting: Advanced Practice Midwife

## 2019-06-28 ENCOUNTER — Other Ambulatory Visit: Payer: Self-pay

## 2019-06-28 ENCOUNTER — Emergency Department (HOSPITAL_COMMUNITY)
Admission: EM | Admit: 2019-06-28 | Discharge: 2019-06-28 | Discharge status: Against Medical Advice | Payer: No Typology Code available for payment source

## 2020-02-23 ENCOUNTER — Ambulatory Visit
Admission: EM | Admit: 2020-02-23 | Discharge: 2020-02-23 | Disposition: A | Discharge status: Home / Self Care | Payer: Self-pay | Attending: Internal Medicine | Admitting: Internal Medicine

## 2020-02-23 DIAGNOSIS — N938 Other specified abnormal uterine and vaginal bleeding: Secondary | ICD-10-CM

## 2020-02-23 LAB — CBC WITH DIFFERENTIAL/PLATELET
Abs Immature Granulocytes: 0.01 10*3/uL (ref 0.00–0.07)
Basophils Absolute: 0 10*3/uL (ref 0.0–0.1)
Basophils Relative: 1 %
Eosinophils Absolute: 0.1 10*3/uL (ref 0.0–0.5)
Eosinophils Relative: 1 %
HCT: 40.5 % (ref 36.0–46.0)
Hemoglobin: 13.6 g/dL (ref 12.0–15.0)
Immature Granulocytes: 0 %
Lymphocytes Relative: 36 %
Lymphs Abs: 2.1 10*3/uL (ref 0.7–4.0)
MCH: 32 pg (ref 26.0–34.0)
MCHC: 33.6 g/dL (ref 30.0–36.0)
MCV: 95.3 fL (ref 80.0–100.0)
Monocytes Absolute: 0.5 10*3/uL (ref 0.1–1.0)
Monocytes Relative: 9 %
Neutro Abs: 3 10*3/uL (ref 1.7–7.7)
Neutrophils Relative %: 53 %
Platelets: 267 10*3/uL (ref 150–400)
RBC: 4.25 MIL/uL (ref 3.87–5.11)
RDW: 11.9 % (ref 11.5–15.5)
WBC: 5.7 10*3/uL (ref 4.0–10.5)
nRBC: 0 % (ref 0.0–0.2)

## 2020-02-23 LAB — TSH: TSH: 0.929 u[IU]/mL (ref 0.350–4.500)

## 2020-02-23 LAB — PREGNANCY, URINE: Preg Test, Ur: NEGATIVE

## 2020-02-23 NOTE — ED Triage Notes (Signed)
Patient in today w/ c/o lower back pain and concerns about menstrual cycle. Patient is asking for lab work and pregnancy test be performed. Patient states menstrual cycle is very regular and had a full cycle that ended last week, and started again last evening w/ abdominal cramping, which patient states is very unusual for her.

## 2020-02-23 NOTE — ED Provider Notes (Signed)
[facility]    CSN: [id number] Arrival date & time: 02/23/20  1254      History   Chief Complaint Chief Complaint  Patient presents with   Back Pain    Lower    HPI Robyn Willis is a 29 y.o. female comes to urgent care with complaints of lower back pain and early onset menstrual cycle today.  Patient started having vaginal bleeding today.  She finished a 7-days of menstrual flow 2 weeks ago.  Prior to that she has been regular.  She recently stopped taking birth control pills in July because of expiry date of October 2020.  Patient is not sexually active at this time.  No dysuria urgency or frequency.  No nausea, vomiting.  She has some lower abdominal cramping with some back pain.  Back pain and crampy abdominal pain started with the onset of vaginal bleeding.  Patient is requesting HIV and RPR evaluation.  No exposures or recent sexual intercourse.  HPI  Past Medical History:  Diagnosis Date   Anxiety    self reported   Medical history non-contributory     Patient Active Problem List   Diagnosis Date Noted   SVD (spontaneous vaginal delivery) 02/15/2017   Normal labor 02/14/2017   Chlamydia infection complicating pregnancy, third trimester 02/07/2017   IUGR (intrauterine growth restriction) affecting care of mother Low AC <10%ile 01/08/2017   Short cervix, antepartum 10/08/2016   Hives 09/10/2016   Marijuana use 08/15/2016   Depression 08/13/2016   Supervision of high risk pregnancy, antepartum 01/02/2016    Past Surgical History:  Procedure Laterality Date   NO PAST SURGERIES      OB History    Gravida  3   Para  2   Term  2   Preterm      AB  1   Living  2     SAB  0   TAB      Ectopic      Multiple  0   Live Births  2            Home Medications    Prior to Admission medications   Medication Sig Start Date End Date Taking? Authorizing Provider  DIFLUCAN 150 MG tablet Take 150 mg by mouth once. 02/17/20   Yes [provider]  metroNIDAZOLE (FLAGYL) 500 MG tablet Take 500 mg by mouth 2 (two) times daily. 09/23/19  Yes [provider]    Family History Family History  Problem Relation Age of Onset   Kidney disease Mother    Diabetes Mother    Hypertension Mother    Diabetes Father    Hypertension Father    Heart disease Maternal Grandmother    Alzheimer's disease Paternal Grandmother     Social History Social History   Tobacco Use   Smoking status: Former Smoker    Quit date: 07/16/2016    Years since quitting: 3.6   Smokeless tobacco: Never Used  Vaping Use   Vaping Use: Former  Substance Use Topics   Alcohol use: No   Drug use: Not Currently    Types: Marijuana    Comment: a couple weeks ago     Allergies   Penicillins   Review of Systems Review of Systems  Constitutional: Negative.   Respiratory: Negative.   Gastrointestinal: Positive for abdominal pain.  Genitourinary: Positive for menstrual problem and vaginal bleeding. Negative for difficulty urinating, dysuria, frequency, urgency, vaginal discharge and vaginal pain.  Musculoskeletal: Positive  for back pain.  Neurological: Negative for dizziness, light-headedness and headaches.     Physical Exam Triage Vital Signs ED Triage Vitals  Enc Vitals Group     BP 02/23/20 1345 (!) 132/99     Pulse Rate 02/23/20 1345 74     Resp 02/23/20 1345 18     Temp 02/23/20 1345 98.1 F (36.7 C)     Temp Source 02/23/20 1345 Oral     SpO2 02/23/20 1345 100 %     Weight --      Height --      Head Circumference --      Peak Flow --      Pain Score 02/23/20 1340 8     Pain Loc --      Pain Edu? --      Excl. in GC? --    No data found.  Updated Vital Signs BP (!) 132/99 (BP Location: Left Arm)    Pulse 74    Temp 98.1 F (36.7 C) (Oral)    Resp 18    LMP 02/23/2020    SpO2 100%   Visual Acuity Right Eye Distance:   Left Eye Distance:   Bilateral Distance:    Right Eye Near:     Left Eye Near:    Bilateral Near:     Physical Exam Vitals and nursing note reviewed.  Constitutional:      General: She is not in acute distress.    Appearance: She is not ill-appearing.  Cardiovascular:     Rate and Rhythm: Normal rate and regular rhythm.     Pulses: Normal pulses.     Heart sounds: Normal heart sounds.  Pulmonary:     Effort: Pulmonary effort is normal.     Breath sounds: Normal breath sounds.  Abdominal:     General: Bowel sounds are normal.     Palpations: Abdomen is soft.  Skin:    General: Skin is warm and dry.  Neurological:     Mental Status: She is alert.      UC Treatments / Results  Labs (all labs ordered are listed, but only abnormal results are displayed) Labs Reviewed  PREGNANCY, URINE  CBC WITH DIFFERENTIAL/PLATELET  TSH  HIV ANTIBODY (ROUTINE TESTING W REFLEX)  RPR    EKG   Radiology No results found.  Procedures Procedures (including critical care time)  Medications Ordered in UC Medications - No data to display  Initial Impression / Assessment and Plan / UC Course  I have reviewed the triage vital signs and the nursing notes.  Pertinent labs & imaging results that were available during my care of the patient were reviewed by me and considered in my medical decision making (see chart for details).     1.  Dysfunctional uterine bleeding likely secondary to recent discontinuation of birth control: TSH, CBC Patient was reassured that her menstrual cycles were regular with with time. If menses continue to be irregular, she will benefit from a gynecology evaluation.  Patient is due for a Pap smear in the next few months and can follow-up with a gynecologist at that time.  2.  STD screening: Patient completed a course of Flagyl for bacterial vaginosis HIV, RPR.  Return precautions given. Final Clinical Impressions(s) / UC Diagnoses   Final diagnoses:  Dysfunctional uterine bleeding   Discharge Instructions   None     ED Prescriptions    None     PDMP not reviewed this encounter.   Kush Farabee,  Britta Mccreedy, MD 02/23/20 [phone]

## 2020-02-24 LAB — HIV ANTIBODY (ROUTINE TESTING W REFLEX): HIV Screen 4th Generation wRfx: NONREACTIVE

## 2020-02-24 LAB — RPR: RPR Ser Ql: NONREACTIVE

## 2020-12-03 ENCOUNTER — Encounter: Payer: Self-pay | Admitting: Emergency Medicine

## 2020-12-03 ENCOUNTER — Ambulatory Visit
Admission: EM | Admit: 2020-12-03 | Discharge: 2020-12-03 | Disposition: A | Discharge status: Home / Self Care | Payer: Medicaid Other | Attending: Family Medicine | Admitting: Family Medicine

## 2020-12-03 ENCOUNTER — Other Ambulatory Visit: Payer: Self-pay

## 2020-12-03 DIAGNOSIS — M545 Low back pain, unspecified: Secondary | ICD-10-CM

## 2020-12-03 LAB — POCT URINALYSIS DIP (MANUAL ENTRY)
Bilirubin, UA: NEGATIVE
Glucose, UA: NEGATIVE mg/dL
Ketones, POC UA: NEGATIVE mg/dL
Nitrite, UA: NEGATIVE
Protein Ur, POC: NEGATIVE mg/dL
Spec Grav, UA: 1.025 (ref 1.010–1.025)
Urobilinogen, UA: 0.2 E.U./dL
pH, UA: 7.5 (ref 5.0–8.0)

## 2020-12-03 LAB — POCT URINE PREGNANCY: Preg Test, Ur: NEGATIVE

## 2020-12-03 MED ORDER — CYCLOBENZAPRINE HCL 10 MG PO TABS: 10.0000 mg | ORAL_TABLET | Freq: Two times a day (BID) | ORAL | 0 refills | Status: DC | PRN

## 2020-12-03 MED ORDER — PREDNISONE 10 MG (21) PO TBPK: ORAL_TABLET | Freq: Every day | ORAL | 0 refills | Status: AC

## 2020-12-03 NOTE — ED Triage Notes (Signed)
Pain on lower right side of back since last week.

## 2020-12-03 NOTE — ED Provider Notes (Signed)
[facility]    CSN: [id number] Arrival date & time: 12/03/20  1200      History   Chief Complaint No chief complaint on file.   HPI Robyn Willis is a 30 y.o. female.   Reports right-sided low back pain since last week.  Has taken ibuprofen with some temporary relief at home.  Denies known injury, previous symptoms.  Denies headache, cough, shortness of breath, nausea, vomiting, diarrhea, rash, fever, other symptoms.  ROS per HPI  The history is provided by the patient.    Past Medical History:  Diagnosis Date  . Anxiety    self reported  . Medical history non-contributory     Patient Active Problem List   Diagnosis Date Noted  . SVD (spontaneous vaginal delivery) 02/15/2017  . Normal labor 02/14/2017  . Chlamydia infection complicating pregnancy, third trimester 02/07/2017  . IUGR (intrauterine growth restriction) affecting care of mother Low AC <10%ile 01/08/2017  . Short cervix, antepartum 10/08/2016  . Hives 09/10/2016  . Marijuana use 08/15/2016  . Depression 08/13/2016  . Supervision of high risk pregnancy, antepartum 01/02/2016    Past Surgical History:  Procedure Laterality Date  . NO PAST SURGERIES      OB History    Gravida  3   Para  2   Term  2   Preterm      AB  1   Living  2     SAB  0   IAB      Ectopic      Multiple  0   Live Births  2            Home Medications    Prior to Admission medications   Medication Sig Start Date End Date Taking? Authorizing Provider  cyclobenzaprine (FLEXERIL) 10 MG tablet Take 1 tablet (10 mg total) by mouth 2 (two) times daily as needed for muscle spasms. 12/03/20  Yes Moshe Cipro, NP  predniSONE (STERAPRED UNI-PAK 21 TAB) 10 MG (21) TBPK tablet Take by mouth daily for 6 days. Take 6 tablets on day 1, 5 tablets on day 2, 4 tablets on day 3, 3 tablets on day 4, 2 tablets on day 5, 1 tablet on day 6 12/03/20 12/09/20 Yes Moshe Cipro, NP    Family  History Family History  Problem Relation Age of Onset  . Kidney disease Mother   . Diabetes Mother   . Hypertension Mother   . Diabetes Father   . Hypertension Father   . Heart disease Maternal Grandmother   . Alzheimer's disease Paternal Grandmother     Social History Social History   Tobacco Use  . Smoking status: Former Smoker    Quit date: 07/16/2016    Years since quitting: 4.3  . Smokeless tobacco: Never Used  Vaping Use  . Vaping Use: Former  Substance Use Topics  . Alcohol use: No  . Drug use: Not Currently    Types: Marijuana    Comment: a couple weeks ago     Allergies   Penicillins   Review of Systems Review of Systems   Physical Exam Triage Vital Signs ED Triage Vitals  Enc Vitals Group     BP 12/03/20 1210 116/79     Pulse Rate 12/03/20 1210 (!) 106     Resp 12/03/20 1210 14     Temp 12/03/20 1210 99 F (37.2 C)     Temp Source 12/03/20 1210 Oral     SpO2 12/03/20 1210 95 %  Weight --      Height --      Head Circumference --      Peak Flow --      Pain Score 12/03/20 1218 10     Pain Loc --      Pain Edu? --      Excl. in GC? --    No data found.  Updated Vital Signs BP 116/79 (BP Location: Right Arm)   Pulse (!) 106   Temp 99 F (37.2 C) (Oral)   Resp 14   SpO2 95%   Visual Acuity Right Eye Distance:   Left Eye Distance:   Bilateral Distance:    Right Eye Near:   Left Eye Near:    Bilateral Near:     Physical Exam Vitals and nursing note reviewed.  Constitutional:      General: She is not in acute distress.    Appearance: Normal appearance. She is well-developed and normal weight. She is not ill-appearing.  HENT:     Head: Normocephalic and atraumatic.     Nose: Nose normal.     Mouth/Throat:     Mouth: Mucous membranes are moist.     Pharynx: Oropharynx is clear.  Eyes:     Extraocular Movements: Extraocular movements intact.     Conjunctiva/sclera: Conjunctivae normal.     Pupils: Pupils are equal, round,  and reactive to light.  Cardiovascular:     Rate and Rhythm: Normal rate and regular rhythm.  Pulmonary:     Effort: Pulmonary effort is normal.     Breath sounds: Normal breath sounds.  Abdominal:     General: There is no distension.     Palpations: Abdomen is soft. There is no mass.     Tenderness: There is no abdominal tenderness. There is no right CVA tenderness, left CVA tenderness, guarding or rebound.     Hernia: No hernia is present.  Musculoskeletal:        General: Tenderness present.     Cervical back: Normal range of motion and neck supple.     Comments: Right low back, no pain radiation, no swelling noted, no erythema, no ecchymosis  Skin:    General: Skin is warm and dry.     Capillary Refill: Capillary refill takes less than 2 seconds.  Neurological:     General: No focal deficit present.     Mental Status: She is alert and oriented to person, place, and time.  Psychiatric:        Mood and Affect: Mood normal.        Behavior: Behavior normal.        Thought Content: Thought content normal.      UC Treatments / Results  Labs (all labs ordered are listed, but only abnormal results are displayed) Labs Reviewed  POCT URINALYSIS DIP (MANUAL ENTRY) - Abnormal; Notable for the following components:      Result Value   Blood, UA trace-intact (*)    Leukocytes, UA Trace (*)    All other components within normal limits  POCT URINE PREGNANCY    EKG   Radiology No results found.  Procedures Procedures (including critical care time)  Medications Ordered in UC Medications - No data to display  Initial Impression / Assessment and Plan / UC Course  I have reviewed the triage vital signs and the nursing notes.  Pertinent labs & imaging results that were available during my care of the patient were reviewed by me and considered in  my medical decision making (see chart for details).    Right-sided low back pain without sciatica Muscle strain  May take 800 mg  ibuprofen with 1000 mg of Tylenol.  Do not exceed 4000 mg of Tylenol in 24 hours. May use ice or heat to the area May use topical rubs to the area Prescribed Flexeril as needed twice daily muscle spasms This medication can make you sleepy, do not drive or operate machinery while taking this medication Steroid taper sent in case symptoms do not resolve over the next 2 days may fill and take as directed Follow up with this office or with primary care if symptoms are persisting. Follow up in the ER for high fever, trouble swallowing, trouble breathing, other concerning symptoms.   Final Clinical Impressions(s) / UC Diagnoses   Final diagnoses:  Acute right-sided low back pain without sciatica     Discharge Instructions     I have sent in flexeril for you to take twice a day as needed for muscle spasms. This medication can make you sleepy. Do not drive or operate heavy machinery with this medication.  I have sent in a prednisone taper for you to take for 6 days. 6 tablets on day one, 5 tablets on day two, 4 tablets on day three, 3 tablets on day four, 2 tablets on day five, and 1 tablet on day six.  May take 800 mg ibuprofen with 1000 mg of Tylenol.  Do not exceed 4000 mg of Tylenol in 24 hours.  Follow up with this office or with primary care if symptoms are persisting.  Follow up in the ER for high fever, trouble swallowing, trouble breathing, other concerning symptoms.     ED Prescriptions    Medication Sig Dispense Auth. Provider   cyclobenzaprine (FLEXERIL) 10 MG tablet Take 1 tablet (10 mg total) by mouth 2 (two) times daily as needed for muscle spasms. 20 tablet Moshe Cipro, NP   predniSONE (STERAPRED UNI-PAK 21 TAB) 10 MG (21) TBPK tablet Take by mouth daily for 6 days. Take 6 tablets on day 1, 5 tablets on day 2, 4 tablets on day 3, 3 tablets on day 4, 2 tablets on day 5, 1 tablet on day 6 21 tablet Moshe Cipro, NP     PDMP not reviewed this encounter.    Moshe Cipro, NP 12/04/20 [phone]

## 2020-12-03 NOTE — Discharge Instructions (Signed)
I have sent in flexeril for you to take twice a day as needed for muscle spasms. This medication can make you sleepy. Do not drive or operate heavy machinery with this medication.  I have sent in a prednisone taper for you to take for 6 days. 6 tablets on day one, 5 tablets on day two, 4 tablets on day three, 3 tablets on day four, 2 tablets on day five, and 1 tablet on day six.  May take 800 mg ibuprofen with 1000 mg of Tylenol.  Do not exceed 4000 mg of Tylenol in 24 hours.  Follow up with this office or with primary care if symptoms are persisting.  Follow up in the ER for high fever, trouble swallowing, trouble breathing, other concerning symptoms.

## 2020-12-07 DIAGNOSIS — H5213 Myopia, bilateral: Secondary | ICD-10-CM | POA: Diagnosis not present

## 2021-03-23 DIAGNOSIS — Z3009 Encounter for other general counseling and advice on contraception: Secondary | ICD-10-CM | POA: Diagnosis not present

## 2021-03-23 DIAGNOSIS — N760 Acute vaginitis: Secondary | ICD-10-CM | POA: Diagnosis not present

## 2021-03-23 DIAGNOSIS — T384X5A Adverse effect of oral contraceptives, initial encounter: Secondary | ICD-10-CM | POA: Diagnosis not present

## 2021-03-23 DIAGNOSIS — N898 Other specified noninflammatory disorders of vagina: Secondary | ICD-10-CM | POA: Diagnosis not present

## 2021-03-30 ENCOUNTER — Other Ambulatory Visit: Payer: Self-pay

## 2021-03-30 ENCOUNTER — Encounter (HOSPITAL_COMMUNITY): Payer: Self-pay | Admitting: Emergency Medicine

## 2021-03-30 ENCOUNTER — Emergency Department (HOSPITAL_COMMUNITY)
Admission: EM | Admit: 2021-03-30 | Discharge: 2021-03-30 | Disposition: A | Discharge status: Home / Self Care | Payer: Medicaid Other | Attending: Emergency Medicine | Admitting: Emergency Medicine

## 2021-03-30 ENCOUNTER — Emergency Department (HOSPITAL_COMMUNITY): Payer: Medicaid Other

## 2021-03-30 DIAGNOSIS — R0602 Shortness of breath: Secondary | ICD-10-CM | POA: Diagnosis not present

## 2021-03-30 DIAGNOSIS — J069 Acute upper respiratory infection, unspecified: Secondary | ICD-10-CM

## 2021-03-30 DIAGNOSIS — R059 Cough, unspecified: Secondary | ICD-10-CM | POA: Diagnosis present

## 2021-03-30 DIAGNOSIS — R079 Chest pain, unspecified: Secondary | ICD-10-CM | POA: Diagnosis not present

## 2021-03-30 DIAGNOSIS — Z87891 Personal history of nicotine dependence: Secondary | ICD-10-CM | POA: Diagnosis not present

## 2021-03-30 NOTE — ED Triage Notes (Signed)
Pt c/o cold symptoms since Wednesday (states she doesn't have COVID, she tested negative yesterday). Pt also c/o possible anxiety and stress. Pt c/o tingling in bilateral hands and feet.

## 2021-03-30 NOTE — Discharge Instructions (Signed)
Drink plenty of fluids.  Take Tylenol for any fever or aches.  Follow-up if not improving

## 2021-03-30 NOTE — ED Notes (Addendum)
pt refuses to allow me to administer covid test.  Md informed.

## 2021-03-30 NOTE — ED Provider Notes (Signed)
[facility] EMERGENCY DEPARTMENT Provider Note   CSN: [id number] Arrival date & time: 03/30/21  1478     History No chief complaint on file.   Robyn Willis is a 30 y.o. female.  Patient complains of a cough and sinus congestion.  Both her kids supposedly had the same symptoms.  She is done COVID test on both of them and herself and it has been negative.  The history is provided by the patient and medical records. No language interpreter was used.  Cough Cough characteristics:  Non-productive Sputum characteristics:  Nondescript Severity:  Mild Onset quality:  Sudden Timing:  Constant Progression:  Worsening Chronicity:  New Smoker: no   Context: not animal exposure   Relieved by:  Nothing Worsened by:  Nothing Associated symptoms: no chest pain, no eye discharge, no headaches and no rash       Past Medical History:  Diagnosis Date   Anxiety    self reported   Medical history non-contributory     Patient Active Problem List   Diagnosis Date Noted   SVD (spontaneous vaginal delivery) 02/15/2017   Normal labor 02/14/2017   Chlamydia infection complicating pregnancy, third trimester 02/07/2017   IUGR (intrauterine growth restriction) affecting care of mother Low AC <10%ile 01/08/2017   Short cervix, antepartum 10/08/2016   Hives 09/10/2016   Marijuana use 08/15/2016   Depression 08/13/2016   Supervision of high risk pregnancy, antepartum 01/02/2016    Past Surgical History:  Procedure Laterality Date   NO PAST SURGERIES       OB History     Gravida  3   Para  2   Term  2   Preterm      AB  1   Living  2      SAB  0   IAB      Ectopic      Multiple  0   Live Births  2           Family History  Problem Relation Age of Onset   Kidney disease Mother    Diabetes Mother    Hypertension Mother    Diabetes Father    Hypertension Father    Heart disease Maternal Grandmother    Alzheimer's disease Paternal Grandmother     Social  History   Tobacco Use   Smoking status: Former    Types: Cigarettes    Quit date: 07/16/2016    Years since quitting: 4.7   Smokeless tobacco: Never  Vaping Use   Vaping Use: Former  Substance Use Topics   Alcohol use: No   Drug use: Not Currently    Types: Marijuana    Comment: a couple weeks ago    Home Medications Prior to Admission medications   Medication Sig Start Date End Date Taking? Authorizing Provider  cyclobenzaprine (FLEXERIL) 10 MG tablet Take 1 tablet (10 mg total) by mouth 2 (two) times daily as needed for muscle spasms. 12/03/20   Moshe Cipro, NP    Allergies    Penicillins  Review of Systems   Review of Systems  Constitutional:  Positive for fatigue. Negative for appetite change.  HENT:  Negative for congestion, ear discharge and sinus pressure.   Eyes:  Negative for discharge.  Respiratory:  Positive for cough.   Cardiovascular:  Negative for chest pain.  Gastrointestinal:  Negative for abdominal pain and diarrhea.  Genitourinary:  Negative for frequency and hematuria.  Musculoskeletal:  Negative for back pain.  Skin:  Negative for rash.  Neurological:  Negative for seizures and headaches.  Psychiatric/Behavioral:  Negative for hallucinations.    Physical Exam Updated Vital Signs BP 129/82 (BP Location: Right Arm)   Pulse 90   Temp 97.9 F (36.6 C) (Oral)   Resp 18   Ht 5\' 4"  (1.626 m)   Wt 51.3 kg   LMP 03/22/2021   SpO2 99%   BMI 19.40 kg/m   Physical Exam Vitals and nursing note reviewed.  Constitutional:      Appearance: She is well-developed.  HENT:     Head: Normocephalic.     Nose: Nose normal.  Eyes:     General: No scleral icterus.    Conjunctiva/sclera: Conjunctivae normal.  Neck:     Thyroid: No thyromegaly.  Cardiovascular:     Rate and Rhythm: Normal rate and regular rhythm.     Heart sounds: No murmur heard.   No friction rub. No gallop.  Pulmonary:     Breath sounds: No stridor. No wheezing or rales.   Chest:     Chest wall: No tenderness.  Abdominal:     General: There is no distension.     Tenderness: There is no abdominal tenderness. There is no rebound.  Musculoskeletal:        General: Normal range of motion.     Cervical back: Neck supple.  Lymphadenopathy:     Cervical: No cervical adenopathy.  Skin:    Findings: No erythema or rash.  Neurological:     Mental Status: She is alert and oriented to person, place, and time.     Motor: No abnormal muscle tone.     Coordination: Coordination normal.  Psychiatric:        Behavior: Behavior normal.    ED Results / Procedures / Treatments   Labs (all labs ordered are listed, but only abnormal results are displayed) Labs Reviewed  RESP PANEL BY RT-PCR (FLU A&B, COVID) [id number]    EKG None  Radiology DG Chest Port 1 View  Result Date: 03/30/2021 CLINICAL DATA:  Shortness of breath.  Chest pain and cold symptoms. EXAM: PORTABLE CHEST 1 VIEW COMPARISON:  09/29/2018 FINDINGS: Single-view of the chest demonstrates clear lungs. Heart and mediastinum are within normal limits. Negative for a pneumothorax. No acute bone abnormality. IMPRESSION: No active disease. Electronically Signed   By: 11/29/2018 M.D.   On: 03/30/2021 08:48    Procedures Procedures   Medications Ordered in ED Medications - No data to display  ED Course  I have reviewed the triage vital signs and the nursing notes.  Pertinent labs & imaging results that were available during my care of the patient were reviewed by me and considered in my medical decision making (see chart for details).    MDM Rules/Calculators/A&P                          Patient with URI symptoms.  Chest x-ray negative.  She refused COVID test because she wanted to swab her nose and hospital policies for the nurses to do the swabbing.  Patient unlikely to have COVID because she had a home test that was negative and she also did test on both her children who have the same symptoms and they  were negative.  Suspect other viral syndrome.  She will drink fluids take Tylenol follow-up as needed Final Clinical Impression(s) / ED Diagnoses Final diagnoses:  URI, acute    Rx / DC Orders  ED Discharge Orders     None        Bethann Berkshire, MD 03/30/21 [phone]

## 2021-04-02 ENCOUNTER — Telehealth: Payer: Self-pay

## 2021-04-02 NOTE — Telephone Encounter (Signed)
Transition Care Management Unsuccessful Follow-up Telephone Call  Date of discharge and from where:  03/30/2021  Attempts:  1st Attempt  Reason for unsuccessful TCM follow-up call:  Left voice message    

## 2021-04-03 NOTE — Telephone Encounter (Signed)
Transition Care Management Unsuccessful Follow-up Telephone Call  Date of discharge and from where:  03/30/2021 from [facility]  Attempts:  2nd Attempt  Reason for unsuccessful TCM follow-up call:  Left voice message

## 2021-04-04 NOTE — Telephone Encounter (Signed)
Transition Care Management Unsuccessful Follow-up Telephone Call  Date of discharge and from where:  03/30/2021[facility]  Attempts:  3rd Attempt  Reason for unsuccessful TCM follow-up call:  Left voice message

## 2021-04-18 ENCOUNTER — Inpatient Hospital Stay: Admission: RE | Admit: 2021-04-18 | Payer: Self-pay | Source: Ambulatory Visit

## 2021-05-09 ENCOUNTER — Ambulatory Visit: Payer: Medicaid Other | Admitting: Obstetrics & Gynecology

## 2021-05-09 ENCOUNTER — Encounter: Payer: Self-pay | Admitting: Obstetrics & Gynecology

## 2021-05-09 ENCOUNTER — Other Ambulatory Visit: Payer: Self-pay

## 2021-05-09 VITALS — BP 122/71 | HR 102 | Ht 64.0 in | Wt 114.2 lb

## 2021-05-09 DIAGNOSIS — Z3045 Encounter for surveillance of transdermal patch hormonal contraceptive device: Secondary | ICD-10-CM | POA: Diagnosis not present

## 2021-05-09 MED ORDER — NORELGESTROMIN-ETH ESTRADIOL 150-35 MCG/24HR TD PTWK: 1.0000 | MEDICATED_PATCH | TRANSDERMAL | 4 refills | Status: DC

## 2021-05-09 NOTE — Progress Notes (Signed)
   GYN VISIT Patient name: Robyn Willis MRN [id number]  Date of birth: 1990/08/02 Chief Complaint:   Contraception (Wants to discuss options. Did not tolerate pills)  History of Present Illness:   Robyn Willis is a 30 y.o. [id number] female being seen today for the following:  Contraception:  on OCPs- about a year.  Notes considerable GI upset  Recently finished Metrogel x 5d due [facility]. When she wipes, notes dark blood in discharge.  This happened 10.15- then started period the next day.     Patient's last menstrual period was 05/05/2021 (exact date).  Depression screen [facility] 2/9 05/09/2021 08/13/2016  Decreased Interest 3 2  Down, Depressed, Hopeless 1 3  PHQ - 2 Score 4 5  Altered sleeping 3 2  Tired, decreased energy 3 -  Change in appetite 2 3  Feeling bad or failure about yourself  3 3  Trouble concentrating 0 0  Moving slowly or fidgety/restless 2 0  Suicidal thoughts 0 0  PHQ-9 Score 17 13     Review of Systems:   Pertinent items are noted in HPI Denies fever/chills, dizziness, headaches, visual disturbances, fatigue, shortness of breath, chest pain, abdominal pain, vomiting, see HPI regarding menses, bowel movements, urination, or intercourse unless otherwise stated above.  Pertinent History Reviewed:  Reviewed past medical,surgical, social, obstetrical and family history.  Reviewed problem list, medications and allergies. Physical Assessment:   Vitals:   05/09/21 1333  BP: 122/71  Pulse: (!) 102  Weight: 114 lb 3.2 oz (51.8 kg)  Height: 5\' 4"  (1.626 m)  Body mass index is 19.6 kg/m.       Physical Examination:   General appearance: alert, well appearing, and in no distress  Psych: mood appropriate, normal affect  Skin: warm & dry   Cardiovascular: normal heart rate noted  Respiratory: normal respiratory effort, no distress  Pelvic: deferred  Extremities: no edema   Chaperone: N/A    Assessment & Plan:  1)  Contraceptive management -reviewed all options- after much discussion, agreeable to try patch -f/u in 7mos or sooner if acute concerns OCP risk assessment: Pt denies personal history of VTE, stroke or heart attack.  Denies personal h/o breast cancer.  Pt is either a non-smoker or smoker under the age of 29yo.  Denies h/o migraines with aura  []  plan to obtain records from health department    No orders of the defined types were placed in this encounter.   No follow-ups on file.   30yo, DO Attending Obstetrician & Gynecologist, [facility] for Robyn Willis, [facility]

## 2021-06-21 ENCOUNTER — Ambulatory Visit: Payer: Medicaid Other

## 2021-07-02 ENCOUNTER — Other Ambulatory Visit (INDEPENDENT_AMBULATORY_CARE_PROVIDER_SITE_OTHER): Payer: Medicaid Other | Admitting: *Deleted

## 2021-07-02 ENCOUNTER — Other Ambulatory Visit: Payer: Self-pay

## 2021-07-02 ENCOUNTER — Other Ambulatory Visit (HOSPITAL_COMMUNITY)
Admission: RE | Admit: 2021-07-02 | Discharge: 2021-07-02 | Disposition: A | Discharge status: Home / Self Care | Payer: Medicaid Other | Source: Ambulatory Visit | Attending: Obstetrics & Gynecology | Admitting: Obstetrics & Gynecology

## 2021-07-02 VITALS — BP 158/95 | HR 65

## 2021-07-02 DIAGNOSIS — R42 Dizziness and giddiness: Secondary | ICD-10-CM | POA: Diagnosis not present

## 2021-07-02 DIAGNOSIS — N898 Other specified noninflammatory disorders of vagina: Secondary | ICD-10-CM | POA: Insufficient documentation

## 2021-07-02 DIAGNOSIS — R457 State of emotional shock and stress, unspecified: Secondary | ICD-10-CM | POA: Diagnosis not present

## 2021-07-02 NOTE — Progress Notes (Signed)
   NURSE VISIT- VAGINITIS/STD  SUBJECTIVE:  Robyn Willis is a 30 y.o. [id number] GYN patientfemale here for a vaginal swab for vaginitis screening, STD screen.  She reports the following symptoms:  vaginal discharge  for 3-4 days. Boyfriend has cheated.  Denies abnormal vaginal bleeding, significant pelvic pain, fever, or UTI symptoms. Pt also mentions having anxiety at night. Has noticed this for months. Advised to schedule an appt to discuss with provider.  OBJECTIVE:  There were no vitals taken for this visit.  Appears well, in no apparent distress  ASSESSMENT: Vaginal swab for vaginitis screening & STD screen.   PLAN: Self-collected vaginal probe for Gonorrhea, Chlamydia, Trichomonas, Bacterial Vaginosis, Yeast sent to lab Treatment: to be determined once results are received Follow-up as needed if symptoms persist/worsen, or new symptoms develop  Malachy Mood  07/02/2021 4:40 PM

## 2021-07-02 NOTE — Progress Notes (Signed)
Chart reviewed for nurse visit. Agree with plan of care.  Adline Potter, NP 07/02/2021 5:07 PM

## 2021-07-03 LAB — CERVICOVAGINAL ANCILLARY ONLY
Bacterial Vaginitis (gardnerella): POSITIVE — AB
Candida Glabrata: NEGATIVE
Candida Vaginitis: NEGATIVE
Chlamydia: NEGATIVE
Comment: NEGATIVE
Comment: NEGATIVE
Comment: NEGATIVE
Comment: NEGATIVE
Comment: NEGATIVE
Comment: NORMAL
Neisseria Gonorrhea: NEGATIVE
Trichomonas: NEGATIVE

## 2021-07-04 ENCOUNTER — Other Ambulatory Visit: Payer: Self-pay | Admitting: Adult Health

## 2021-07-04 MED ORDER — METRONIDAZOLE 500 MG PO TABS
500.0000 mg | ORAL_TABLET | Freq: Two times a day (BID) | ORAL | 0 refills | Status: DC
Start: 2021-07-04 — End: 2021-09-12

## 2021-07-04 NOTE — Progress Notes (Signed)
+  BV on vaginal swab, will rx flagyl 

## 2021-07-05 ENCOUNTER — Ambulatory Visit: Payer: Medicaid Other | Admitting: Adult Health

## 2021-09-12 ENCOUNTER — Other Ambulatory Visit (INDEPENDENT_AMBULATORY_CARE_PROVIDER_SITE_OTHER): Payer: Medicaid Other

## 2021-09-12 ENCOUNTER — Other Ambulatory Visit: Payer: Self-pay

## 2021-09-12 ENCOUNTER — Other Ambulatory Visit (HOSPITAL_COMMUNITY)
Admission: RE | Admit: 2021-09-12 | Discharge: 2021-09-12 | Disposition: A | Discharge status: Home / Self Care | Payer: Medicaid Other | Source: Ambulatory Visit | Attending: Obstetrics & Gynecology | Admitting: Obstetrics & Gynecology

## 2021-09-12 DIAGNOSIS — N898 Other specified noninflammatory disorders of vagina: Secondary | ICD-10-CM | POA: Diagnosis not present

## 2021-09-12 NOTE — Progress Notes (Signed)
° °  NURSE VISIT- VAGINITIS/STD  SUBJECTIVE:  Robyn Willis is a 31 y.o. [id number] GYN patientfemale here for a vaginal swab for vaginitis screening, STD screen.  She reports the following symptoms: discharge described as white, clear, and malodorous, local irritation, and odor for 2 days. Denies abnormal vaginal bleeding, significant pelvic pain, fever, or UTI symptoms.  OBJECTIVE:  There were no vitals taken for this visit.  Appears well, in no apparent distress  ASSESSMENT: Vaginal swab for  vaginitis & STD screening  PLAN: Self-collected vaginal probe for Gonorrhea, Chlamydia, Trichomonas, Bacterial Vaginosis, Yeast sent to lab Treatment: to be determined once results are received Follow-up as needed if symptoms persist/worsen, or new symptoms develop  Robyn Willis  09/12/2021 2:20 PM

## 2021-09-14 ENCOUNTER — Other Ambulatory Visit: Payer: Self-pay | Admitting: Advanced Practice Midwife

## 2021-09-14 DIAGNOSIS — N760 Acute vaginitis: Secondary | ICD-10-CM

## 2021-09-14 DIAGNOSIS — B9689 Other specified bacterial agents as the cause of diseases classified elsewhere: Secondary | ICD-10-CM

## 2021-09-14 LAB — CERVICOVAGINAL ANCILLARY ONLY
Bacterial Vaginitis (gardnerella): POSITIVE — AB
Candida Glabrata: NEGATIVE
Candida Vaginitis: NEGATIVE
Chlamydia: NEGATIVE
Comment: NEGATIVE
Comment: NEGATIVE
Comment: NEGATIVE
Comment: NEGATIVE
Comment: NEGATIVE
Comment: NORMAL
Neisseria Gonorrhea: NEGATIVE
Trichomonas: NEGATIVE

## 2021-09-14 MED ORDER — METRONIDAZOLE 500 MG PO TABS: 500.0000 mg | ORAL_TABLET | Freq: Two times a day (BID) | ORAL | 0 refills | Status: DC

## 2021-09-17 ENCOUNTER — Telehealth: Payer: Self-pay

## 2021-09-17 ENCOUNTER — Encounter: Payer: Self-pay | Admitting: Obstetrics & Gynecology

## 2021-09-20 NOTE — Telephone Encounter (Signed)
RESOLVED. 

## 2021-09-25 ENCOUNTER — Ambulatory Visit: Payer: Medicaid Other

## 2021-09-25 ENCOUNTER — Ambulatory Visit: Payer: Self-pay

## 2021-10-25 ENCOUNTER — Other Ambulatory Visit: Payer: Medicaid Other

## 2021-10-27 DIAGNOSIS — R457 State of emotional shock and stress, unspecified: Secondary | ICD-10-CM | POA: Diagnosis not present

## 2021-10-29 ENCOUNTER — Telehealth: Payer: Medicaid Other | Admitting: Family Medicine

## 2021-10-29 DIAGNOSIS — Z202 Contact with and (suspected) exposure to infections with a predominantly sexual mode of transmission: Secondary | ICD-10-CM

## 2021-10-29 NOTE — Patient Instructions (Signed)
Please call you OBGYN office and be seen for STD testing needs. ? ? ? ?

## 2021-10-29 NOTE — Progress Notes (Signed)
[place]  ? ?Needs to have STD testing-  ? ?Patient acknowledged agreement and understanding of the plan.  ? ?

## 2021-10-30 DIAGNOSIS — N760 Acute vaginitis: Secondary | ICD-10-CM | POA: Diagnosis not present

## 2021-10-30 DIAGNOSIS — Z113 Encounter for screening for infections with a predominantly sexual mode of transmission: Secondary | ICD-10-CM | POA: Diagnosis not present

## 2021-10-30 DIAGNOSIS — N898 Other specified noninflammatory disorders of vagina: Secondary | ICD-10-CM | POA: Diagnosis not present

## 2021-10-30 DIAGNOSIS — Z0389 Encounter for observation for other suspected diseases and conditions ruled out: Secondary | ICD-10-CM | POA: Diagnosis not present

## 2021-11-01 ENCOUNTER — Ambulatory Visit: Payer: Medicaid Other | Admitting: Obstetrics & Gynecology

## 2021-12-21 ENCOUNTER — Ambulatory Visit: Payer: Medicaid Other | Admitting: Adult Health

## 2021-12-26 ENCOUNTER — Ambulatory Visit: Payer: Medicaid Other | Admitting: Advanced Practice Midwife

## 2021-12-28 ENCOUNTER — Ambulatory Visit: Payer: Medicaid Other

## 2022-01-02 ENCOUNTER — Ambulatory Visit
Admission: RE | Admit: 2022-01-02 | Discharge: 2022-01-02 | Disposition: A | Discharge status: Home / Self Care | Payer: Medicaid Other | Source: Ambulatory Visit | Attending: Nurse Practitioner | Admitting: Nurse Practitioner

## 2022-01-02 VITALS — BP 122/78 | HR 76 | Temp 98.1°F | Resp 16

## 2022-01-02 DIAGNOSIS — N898 Other specified noninflammatory disorders of vagina: Secondary | ICD-10-CM | POA: Insufficient documentation

## 2022-01-02 DIAGNOSIS — R1011 Right upper quadrant pain: Secondary | ICD-10-CM | POA: Insufficient documentation

## 2022-01-02 LAB — POCT URINALYSIS DIP (MANUAL ENTRY)
Bilirubin, UA: NEGATIVE
Blood, UA: NEGATIVE
Glucose, UA: NEGATIVE mg/dL
Ketones, POC UA: NEGATIVE mg/dL
Leukocytes, UA: NEGATIVE
Nitrite, UA: NEGATIVE
Protein Ur, POC: NEGATIVE mg/dL
Spec Grav, UA: 1.02 (ref 1.010–1.025)
Urobilinogen, UA: 0.2 E.U./dL
pH, UA: 8.5 — AB (ref 5.0–8.0)

## 2022-01-02 LAB — POCT URINE PREGNANCY: Preg Test, Ur: NEGATIVE

## 2022-01-02 NOTE — ED Provider Notes (Signed)
[facility]    CSN: [id number] Arrival date & time: 01/02/22  1424      History   Chief Complaint Chief Complaint  Patient presents with   Appointment    1530   Abdominal Pain    HPI Robyn Willis is a 31 y.o. female.   Patient presents for right sided abdominal pain, bloating, and a lump in her right side.  Reports the abdominal pain is a 6/10 and describes as discomfort/cramping.  The pain comes and goes.  Denies radiation of pain to other parts of her abdomen.  She denies fevers, nausea/vomiting, decreased appetite, diarrhea, constipation, blood in her stool, heartburn, rash, dysuria/urinary frequency, or hematuria.  She has not tried anything for her symptoms.  Nothing seems to make the pain better or worse.  Patient also complains of vaginal discharge that has been present for about a week.  She reports a history significant for frequent yeast infections and bacterial vaginosis.  Reports occasionally with bacterial vaginosis, she will have some abdominal cramping, however usually it is lower.  She denies any rashes, sores or lesions.  She endorses some vaginal irritation.  Has not taken anything for her vaginal symptoms besides her normal regimen.      Past Medical History:  Diagnosis Date   Anxiety    self reported   Medical history non-contributory     Patient Active Problem List   Diagnosis Date Noted   SVD (spontaneous vaginal delivery) 02/15/2017   Normal labor 02/14/2017   Chlamydia infection complicating pregnancy, third trimester 02/07/2017   IUGR (intrauterine growth restriction) affecting care of mother Low AC <10%ile 01/08/2017   Short cervix, antepartum 10/08/2016   Hives 09/10/2016   Marijuana use 08/15/2016   Depression 08/13/2016   Supervision of high risk pregnancy, antepartum 01/02/2016    Past Surgical History:  Procedure Laterality Date   NO PAST SURGERIES      OB History     Gravida  3   Para  2   Term  2   Preterm       AB  1   Living  2      SAB  0   IAB      Ectopic      Multiple  0   Live Births  2            Home Medications    Prior to Admission medications   Medication Sig Start Date End Date Taking? Authorizing Provider  norelgestromin-ethinyl estradiol (ORTHO EVRA) 150-35 MCG/24HR transdermal patch Place 1 patch onto the skin once a week. 05/09/21 08/07/21  Janyth Pupa, DO    Family History Family History  Problem Relation Age of Onset   Kidney disease Mother    Diabetes Mother    Hypertension Mother    Diabetes Father    Hypertension Father    Heart disease Maternal Grandmother    Alzheimer's disease Paternal Grandmother     Social History Social History   Tobacco Use   Smoking status: Former    Types: Cigarettes    Quit date: 07/16/2016    Years since quitting: 5.4   Smokeless tobacco: Never  Vaping Use   Vaping Use: Former  Substance Use Topics   Alcohol use: No   Drug use: Not Currently    Types: Marijuana    Comment: a couple weeks ago     Allergies   Penicillins, Ciprofloxacin, and Metronidazole   Review of Systems Review of Systems Per  HPI  Physical Exam Triage Vital Signs ED Triage Vitals  Enc Vitals Group     BP 01/02/22 1522 122/78     Pulse Rate 01/02/22 1522 76     Resp 01/02/22 1522 16     Temp 01/02/22 1522 98.1 F (36.7 C)     Temp Source 01/02/22 1522 Oral     SpO2 01/02/22 1522 98 %     Weight --      Height --      Head Circumference --      Peak Flow --      Pain Score 01/02/22 1520 6     Pain Loc --      Pain Edu? --      Excl. in [place]? --    No data found.  Updated Vital Signs BP 122/78 (BP Location: Right Arm)   Pulse 76   Temp 98.1 F (36.7 C) (Oral)   Resp 16   LMP 12/12/2021 (Exact Date)   SpO2 98%   Visual Acuity Right Eye Distance:   Left Eye Distance:   Bilateral Distance:    Right Eye Near:   Left Eye Near:    Bilateral Near:     Physical Exam Vitals and nursing note reviewed.   Constitutional:      General: She is not in acute distress.    Appearance: Normal appearance. She is not toxic-appearing.  HENT:     Head: Normocephalic and atraumatic.     Mouth/Throat:     Mouth: Mucous membranes are moist.     Pharynx: Oropharynx is clear.  Eyes:     General: No scleral icterus.    Extraocular Movements: Extraocular movements intact.  Cardiovascular:     Rate and Rhythm: Normal rate and regular rhythm.  Pulmonary:     Effort: Pulmonary effort is normal. No respiratory distress.     Breath sounds: Normal breath sounds. No wheezing, rhonchi or rales.  Abdominal:     General: Abdomen is flat. Bowel sounds are normal. There is no distension.     Palpations: Abdomen is soft.     Tenderness: There is abdominal tenderness in the right upper quadrant. There is no right CVA tenderness, left CVA tenderness, guarding or rebound. Negative signs include Murphy's sign.  Genitourinary:    Comments: Deferred-self swab performed Musculoskeletal:     Cervical back: Normal range of motion.  Lymphadenopathy:     Cervical: No cervical adenopathy.  Skin:    General: Skin is warm and dry.     Capillary Refill: Capillary refill takes less than 2 seconds.     Coloration: Skin is not jaundiced or pale.     Findings: No erythema or rash.  Neurological:     Mental Status: She is alert and oriented to person, place, and time.  Psychiatric:        Mood and Affect: Mood is anxious.        Behavior: Behavior is cooperative.      UC Treatments / Results  Labs (all labs ordered are listed, but only abnormal results are displayed) Labs Reviewed  POCT URINALYSIS DIP (MANUAL ENTRY) - Abnormal; Notable for the following components:      Result Value   Clarity, UA hazy (*)    pH, UA 8.5 (*)    All other components within normal limits  RPR  HIV ANTIBODY (ROUTINE TESTING W REFLEX)  POCT URINE PREGNANCY  CERVICOVAGINAL ANCILLARY ONLY    EKG   Radiology No results  found.  Procedures Procedures (including critical care time)  Medications Ordered in UC Medications - No data to display  Initial Impression / Assessment and Plan / UC Course  I have reviewed the triage vital signs and the nursing notes.  Pertinent labs & imaging results that were available during my care of the patient were reviewed by me and considered in my medical decision making (see chart for details).    Urinalysis today does not show signs of infectious process. Given vaginal discharge, will check self swab for gonorrhea, chlamydia, trichomonas, bacterial vaginosis, and yeast vaginitis.  Treat as indicated.  I suspect abdominal pain may be related to bacterial vaginosis versus gas pain; there is no guarding on exam, no rebound tenderness.  We discussed that if her symptoms worsen or she develops any fever, nausea/vomiting, decreased appetite, she is to go immediately to the emergency room for further evaluation of the abdominal pain.   The patient was given the opportunity to ask questions.  All questions answered to their satisfaction.  The patient is in agreement to this plan.   Final Clinical Impressions(s) / UC Diagnoses   Final diagnoses:  Abdominal pain, right upper quadrant  Vaginal discharge     Discharge Instructions      - The urine testing does not show signs of acute kidney infection or UTI today; the pregnancy test is negative - We are sending the vaginal swab off for testing and will let you know if anything comes back positive - I suspect the pain in your abdomen is most likely gas pain; please start hydrating with plenty of water and you can try Gas-ex to help with the pain.  If the pain worsens or if you develop fevers, nausea/vomiting, or decreased appetite, please go to the Emergency Room   ED Prescriptions   None    PDMP not reviewed this encounter.   Eulogio Bear, NP 01/02/22 1710

## 2022-01-02 NOTE — Discharge Instructions (Addendum)
-   The urine testing does not show signs of acute kidney infection or UTI today; the pregnancy test is negative - We are sending the vaginal swab off for testing and will let you know if anything comes back positive - I suspect the pain in your abdomen is most likely gas pain; please start hydrating with plenty of water and you can try Gas-ex to help with the pain.  If the pain worsens or if you develop fevers, nausea/vomiting, or decreased appetite, please go to the Emergency Room

## 2022-01-02 NOTE — ED Triage Notes (Signed)
Pt reports right lower quadrant abdominal pain since this morning. States sh feels bloated and a lump in the area.

## 2022-01-03 ENCOUNTER — Telehealth (HOSPITAL_COMMUNITY): Payer: Self-pay | Admitting: Emergency Medicine

## 2022-01-03 ENCOUNTER — Encounter: Payer: Self-pay | Admitting: Emergency Medicine

## 2022-01-03 LAB — CERVICOVAGINAL ANCILLARY ONLY
Bacterial Vaginitis (gardnerella): POSITIVE — AB
Candida Glabrata: NEGATIVE
Candida Vaginitis: NEGATIVE
Chlamydia: NEGATIVE
Comment: NEGATIVE
Comment: NEGATIVE
Comment: NEGATIVE
Comment: NEGATIVE
Comment: NEGATIVE
Comment: NORMAL
Neisseria Gonorrhea: NEGATIVE
Trichomonas: NEGATIVE

## 2022-01-03 LAB — HIV ANTIBODY (ROUTINE TESTING W REFLEX): HIV Screen 4th Generation wRfx: NONREACTIVE

## 2022-01-03 LAB — RPR: RPR Ser Ql: NONREACTIVE

## 2022-01-03 MED ORDER — METRONIDAZOLE 0.75 % VA GEL: 1.0000 | Freq: Every day | VAGINAL | 0 refills | Status: AC

## 2022-01-19 ENCOUNTER — Telehealth: Payer: Self-pay | Admitting: Emergency Medicine

## 2022-01-19 NOTE — Telephone Encounter (Signed)
Pt called and inquired about diflucan being prescribed for possible yeast infection related to abx gel use for BV diagnosis. Consulted provider and reported pt would need to be re-seen again due to BV diagnosis being 6/14. Pt verbalized understanding.

## 2022-02-06 DIAGNOSIS — Z3201 Encounter for pregnancy test, result positive: Secondary | ICD-10-CM | POA: Diagnosis not present

## 2022-02-21 DIAGNOSIS — B370 Candidal stomatitis: Secondary | ICD-10-CM | POA: Diagnosis not present

## 2022-03-04 DIAGNOSIS — Z3009 Encounter for other general counseling and advice on contraception: Secondary | ICD-10-CM | POA: Diagnosis not present

## 2022-03-04 DIAGNOSIS — Z3201 Encounter for pregnancy test, result positive: Secondary | ICD-10-CM | POA: Diagnosis not present

## 2022-03-08 ENCOUNTER — Ambulatory Visit: Payer: Medicaid Other

## 2022-03-08 DIAGNOSIS — O200 Threatened abortion: Secondary | ICD-10-CM | POA: Diagnosis not present

## 2022-03-08 DIAGNOSIS — N898 Other specified noninflammatory disorders of vagina: Secondary | ICD-10-CM | POA: Diagnosis not present

## 2022-03-08 DIAGNOSIS — Z0389 Encounter for observation for other suspected diseases and conditions ruled out: Secondary | ICD-10-CM | POA: Diagnosis not present

## 2022-05-08 ENCOUNTER — Ambulatory Visit: Payer: Medicaid Other | Admitting: Adult Health

## 2022-06-06 ENCOUNTER — Encounter: Payer: Medicaid Other | Admitting: Advanced Practice Midwife

## 2022-06-10 ENCOUNTER — Ambulatory Visit: Payer: Medicaid Other

## 2022-07-08 ENCOUNTER — Telehealth: Payer: Self-pay

## 2022-07-08 NOTE — Telephone Encounter (Signed)
Called pt to confirm appointment for tomorrow.  Left message for pt to call and confirm appointment by 1500 

## 2022-07-10 ENCOUNTER — Encounter: Payer: Medicaid Other | Admitting: Obstetrics and Gynecology

## 2022-07-23 ENCOUNTER — Encounter: Payer: Medicaid Other | Admitting: Obstetrics & Gynecology

## 2022-08-13 ENCOUNTER — Ambulatory Visit: Payer: Medicaid Other

## 2022-08-16 DIAGNOSIS — Z113 Encounter for screening for infections with a predominantly sexual mode of transmission: Secondary | ICD-10-CM | POA: Diagnosis not present

## 2022-08-16 DIAGNOSIS — B3731 Acute candidiasis of vulva and vagina: Secondary | ICD-10-CM | POA: Diagnosis not present

## 2022-08-16 DIAGNOSIS — N760 Acute vaginitis: Secondary | ICD-10-CM | POA: Diagnosis not present

## 2022-09-06 ENCOUNTER — Telehealth: Payer: Self-pay

## 2022-09-06 NOTE — Telephone Encounter (Signed)
Attempted to reach patient regarding message left with answering service.. Left voicemail for patient to call office or send mychart message with details regarding appointment needs.

## 2022-09-16 DIAGNOSIS — Z681 Body mass index (BMI) 19 or less, adult: Secondary | ICD-10-CM | POA: Diagnosis not present

## 2022-09-16 DIAGNOSIS — A084 Viral intestinal infection, unspecified: Secondary | ICD-10-CM | POA: Diagnosis not present

## 2022-10-05 ENCOUNTER — Ambulatory Visit: Payer: Medicaid Other

## 2022-11-15 DIAGNOSIS — Z3009 Encounter for other general counseling and advice on contraception: Secondary | ICD-10-CM | POA: Diagnosis not present

## 2022-11-15 DIAGNOSIS — L659 Nonscarring hair loss, unspecified: Secondary | ICD-10-CM | POA: Diagnosis not present

## 2022-12-11 DIAGNOSIS — H5213 Myopia, bilateral: Secondary | ICD-10-CM | POA: Diagnosis not present

## 2023-01-04 ENCOUNTER — Telehealth: Payer: Medicaid Other | Admitting: Physician Assistant

## 2023-01-04 DIAGNOSIS — B9689 Other specified bacterial agents as the cause of diseases classified elsewhere: Secondary | ICD-10-CM | POA: Diagnosis not present

## 2023-01-04 DIAGNOSIS — N760 Acute vaginitis: Secondary | ICD-10-CM

## 2023-01-04 MED ORDER — CLINDAMYCIN HCL 300 MG PO CAPS
300.0000 mg | ORAL_CAPSULE | Freq: Two times a day (BID) | ORAL | 0 refills | Status: AC
Start: 2023-01-04 — End: 2023-01-11

## 2023-01-04 NOTE — Patient Instructions (Signed)
  Laqueta Linden, thank you for joining Tylene Fantasia Ward, PA-C for today's virtual visit.  While this provider is not your primary care provider (PCP), if your PCP is located in our provider database this encounter information will be shared with them immediately following your visit.   A [facility] MyChart account gives you access to today's visit and all your visits, tests, and labs performed at [facility] " click here if you don't have a [facility] MyChart account or go to [email or web address]  Consent: (Patient) Robyn Willis provided verbal consent for this virtual visit at the beginning of the encounter.  Current Medications:  Current Outpatient Medications:    clindamycin (CLEOCIN) 300 MG capsule, Take 1 capsule (300 mg total) by mouth in the morning and at bedtime for 7 days., Disp: 14 capsule, Rfl: 0   norelgestromin-ethinyl estradiol (ORTHO EVRA) 150-35 MCG/24HR transdermal patch, Place 1 patch onto the skin once a week., Disp: 12 patch, Rfl: 4   Medications ordered in this encounter:  Meds ordered this encounter  Medications   clindamycin (CLEOCIN) 300 MG capsule    Sig: Take 1 capsule (300 mg total) by mouth in the morning and at bedtime for 7 days.    Dispense:  14 capsule    Refill:  0    Order Specific Question:   Supervising Provider    Answer:   Merrilee Jansky [id number]     *If you need refills on other medications prior to your next appointment, please contact your pharmacy*  Follow-Up: Call back or seek an in-person evaluation if the symptoms worsen or if the condition fails to improve as anticipated.  [facility] [phone]  Other Instructions Take medication as prescribed.  If no improvement follow up for in person evaluation.    If you have been instructed to have an in-person evaluation today at a local Urgent Care facility, please use the link below. It will take you to a list of all of our available [facility], including address, phone number and hours of operation. Please do not delay care.  [facility]  If you or a family member do not have a primary care provider, use the link below to schedule a visit and establish care. When you choose a [facility] primary care physician or advanced practice provider, you gain a long-term partner in health. Find a Primary Care Provider  Learn more about [facility]'s in-office and virtual care options: [facility] - Get Care Now

## 2023-01-04 NOTE — Progress Notes (Signed)
Virtual Visit Consent   Robyn Willis, you are scheduled for a virtual visit with a [facility] provider today. Just as with appointments in the office, your consent must be obtained to participate. Your consent will be active for this visit and any virtual visit you may have with one of our providers in the next 365 days. If you have a MyChart account, a copy of this consent can be sent to you electronically.  As this is a virtual visit, video technology does not allow for your provider to perform a traditional examination. This may limit your provider's ability to fully assess your condition. If your provider identifies any concerns that need to be evaluated in person or the need to arrange testing (such as labs, EKG, etc.), we will make arrangements to do so. Although advances in technology are sophisticated, we cannot ensure that it will always work on either your end or our end. If the connection with a video visit is poor, the visit may have to be switched to a telephone visit. With either a video or telephone visit, we are not always able to ensure that we have a secure connection.  By engaging in this virtual visit, you consent to the provision of healthcare and authorize for your insurance to be billed (if applicable) for the services provided during this visit. Depending on your insurance coverage, you may receive a charge related to this service.  I need to obtain your verbal consent now. Are you willing to proceed with your visit today? Robyn Willis has provided verbal consent on 01/04/2023 for a virtual visit (video or telephone). Tylene Fantasia Ward, PA-C  Date: 01/04/2023 3:57 PM  Virtual Visit via Video Note   I, Tylene Fantasia Ward, connected with  Robyn Willis  ([id number], May 23, 1991) on 01/04/23 at  4:00 PM EDT by a video-enabled telemedicine application and verified that I am speaking with the correct person using two identifiers.  Location: Patient: Virtual Visit Location Patient:  Home Provider: Virtual Visit Location Provider: Home Office   I discussed the limitations of evaluation and management by telemedicine and the availability of in person appointments. The patient expressed understanding and agreed to proceed.    History of Present Illness: Robyn Willis is a 32 y.o. who identifies as a female who was assigned female at birth, and is being seen today for increased vaginal discharge and foul odor.  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  Denies vaginal itching, pelvic pain, fever, chills.  She reports clindamycin has been more helpful in the past, flagyl has only given temporary relief. Requests oral clinda.   HPI: HPI  Problems:  Patient Active Problem List   Diagnosis Date Noted   SVD (spontaneous vaginal delivery) 02/15/2017   Normal labor 02/14/2017   Chlamydia infection complicating pregnancy, third trimester 02/07/2017   IUGR (intrauterine growth restriction) affecting care of mother Low AC <10%ile 01/08/2017   Short cervix, antepartum 10/08/2016   Hives 09/10/2016   Marijuana use 08/15/2016   Depression 08/13/2016   Supervision of high risk pregnancy, antepartum 01/02/2016    Allergies:  Allergies  Allergen Reactions   Penicillins Hives    Has patient had a PCN reaction causing immediate rash, facial/tongue/throat swelling, SOB or lightheadedness with hypotension: No Has patient had a PCN reaction causing severe rash involving mucus membranes or skin necrosis: No Has patient had a PCN reaction that required hospitalization No Has patient had a PCN reaction occurring within the  last 10 years: No If all of the above answers are "NO", then may proceed with Cephalosporin  Has patient had a PCN reaction causing immediate rash, facial/tongue/throat swelling, SOB or lightheadedness with hypotension: No Has patient had a PCN reaction causing severe rash involving mucus membranes or skin necrosis: No Has patient had a PCN reaction that  required hospitalization No Has patient had a PCN reaction occurring within the last 10 years: No If all of the above answers are "NO", then may proceed with Cephalosporin    Ciprofloxacin Nausea And Vomiting   Metronidazole Hives    Patient states she does fine with the gel   Medications:  Current Outpatient Medications:    clindamycin (CLEOCIN) 300 MG capsule, Take 1 capsule (300 mg total) by mouth in the morning and at bedtime for 7 days., Disp: 14 capsule, Rfl: 0   norelgestromin-ethinyl estradiol (ORTHO EVRA) 150-35 MCG/24HR transdermal patch, Place 1 patch onto the skin once a week., Disp: 12 patch, Rfl: 4  Observations/Objective: Patient is well-developed, well-nourished in no acute distress.  Resting comfortably at home.  Head is normocephalic, atraumatic.  No labored breathing.  Speech is clear and coherent with logical content.  Patient is alert and oriented at baseline.    Assessment and Plan: 1. Bacterial vaginosis - clindamycin (CLEOCIN) 300 MG capsule; Take 1 capsule (300 mg total) by mouth in the morning and at bedtime for 7 days.  Dispense: 14 capsule; Refill: 0    Follow Up Instructions: I discussed the assessment and treatment plan with the patient. The patient was provided an opportunity to ask questions and all were answered. The patient agreed with the plan and demonstrated an understanding of the instructions.  A copy of instructions were sent to the patient via MyChart unless otherwise noted below.     The patient was advised to call back or seek an in-person evaluation if the symptoms worsen or if the condition fails to improve as anticipated.  Time:  I spent 9 minutes with the patient via telehealth technology discussing the above problems/concerns.    Tylene Fantasia Ward, PA-C

## 2023-02-02 ENCOUNTER — Ambulatory Visit: Payer: Medicaid Other

## 2023-02-04 ENCOUNTER — Ambulatory Visit (INDEPENDENT_AMBULATORY_CARE_PROVIDER_SITE_OTHER): Payer: Medicaid Other

## 2023-02-04 ENCOUNTER — Other Ambulatory Visit: Payer: Self-pay

## 2023-02-04 ENCOUNTER — Ambulatory Visit
Admission: EM | Admit: 2023-02-04 | Discharge: 2023-02-04 | Disposition: A | Discharge status: Home / Self Care | Payer: Medicaid Other | Attending: Emergency Medicine | Admitting: Emergency Medicine

## 2023-02-04 ENCOUNTER — Ambulatory Visit: Payer: Medicaid Other

## 2023-02-04 DIAGNOSIS — R0602 Shortness of breath: Secondary | ICD-10-CM

## 2023-02-04 DIAGNOSIS — R109 Unspecified abdominal pain: Secondary | ICD-10-CM

## 2023-02-04 DIAGNOSIS — M545 Low back pain, unspecified: Secondary | ICD-10-CM | POA: Diagnosis not present

## 2023-02-04 DIAGNOSIS — R319 Hematuria, unspecified: Secondary | ICD-10-CM

## 2023-02-04 DIAGNOSIS — R071 Chest pain on breathing: Secondary | ICD-10-CM | POA: Diagnosis not present

## 2023-02-04 LAB — POCT URINALYSIS DIP (MANUAL ENTRY)
Bilirubin, UA: NEGATIVE
Glucose, UA: NEGATIVE mg/dL
Ketones, POC UA: NEGATIVE mg/dL
Leukocytes, UA: NEGATIVE
Nitrite, UA: NEGATIVE
Protein Ur, POC: NEGATIVE mg/dL
Spec Grav, UA: 1.025 (ref 1.010–1.025)
Urobilinogen, UA: 1 E.U./dL
pH, UA: 7 (ref 5.0–8.0)

## 2023-02-04 LAB — POCT URINE PREGNANCY: Preg Test, Ur: NEGATIVE

## 2023-02-04 MED ORDER — IBUPROFEN 600 MG PO TABS: 600.0000 mg | ORAL_TABLET | Freq: Four times a day (QID) | ORAL | 0 refills | Status: DC | PRN

## 2023-02-04 MED ORDER — TAMSULOSIN HCL 0.4 MG PO CAPS: 0.4000 mg | ORAL_CAPSULE | Freq: Every day | ORAL | 0 refills | Status: AC

## 2023-02-04 NOTE — ED Triage Notes (Signed)
Patient presents to UC for SOB, states she is a smoker and has a hx of anxiety. States she is concerned with lung cancer, family hx of cancer.

## 2023-02-04 NOTE — ED Provider Notes (Signed)
HPI  SUBJECTIVE:  Robyn Willis is a 32 y.o. female who presents with several issues today.  First, she reports intermittent right lower chest pain described as dull/pressure, shortness of breath starting today.  It lasts up to an hour and then resolves.  She reports a dry cough.  No wheezing, dyspnea on exertion, trauma to the chest, palpitations, calf pain, swelling, hemoptysis, surgery in the past 4 weeks, recent immobilization, denies exogenous estrogen use.  She is worried about lung cancer as this runs in the family and she is a smoker.  She states that she has been under increased amount of stress recently.  She has not tried anything for symptoms.  No aggravating or alleviating factors.  There is no exertional component to it.  She also reports 1 week of right lower back pain that radiates into her right lower chest/flank.  She reports intermittent, low midline abdominal throbbing pain, urinary frequency.  No nausea, vomiting, fevers, dysuria, urinary urgency, cloudy or odorous urine, hematuria, vaginal odor, bleeding, change in her baseline discharge.  She is in a long-term monogamous relationship with a female, who is asymptomatic.  STDs are not a concern today.  She started a new job 2 weeks ago where she spends prolonged periods of time on her feet.  She has not tried anything for her back pain.  Symptoms are better with lying down and rest, worse with picking things up.  Past medical history of anxiety, depression, and is a smoker.  No history of PE, DVT, pneumothorax, pulmonary disease, UTI, pyelonephritis, nephrolithiasis.  Family history significant for lung cancer, chronic kidney disease.  No history of nephrolithiasis.  LMP: July 6.  Unsure if she could be pregnant.  [facility]    Past Medical History:  Diagnosis Date   Anxiety    self reported   Medical history non-contributory     Past Surgical History:  Procedure Laterality Date   NO PAST SURGERIES      Family  History  Problem Relation Age of Onset   Kidney disease Mother    Diabetes Mother    Hypertension Mother    Diabetes Father    Hypertension Father    Heart disease Maternal Grandmother    Alzheimer's disease Paternal Grandmother     Social History   Tobacco Use   Smoking status: Former    Current packs/day: 0.00    Types: Cigarettes    Quit date: 07/16/2016    Years since quitting: 6.5   Smokeless tobacco: Never  Vaping Use   Vaping status: Former  Substance Use Topics   Alcohol use: No   Drug use: Not Currently    Types: Marijuana    Comment: a couple weeks ago    No current facility-administered medications for this encounter.  Current Outpatient Medications:    ibuprofen (ADVIL) 600 MG tablet, Take 1 tablet (600 mg total) by mouth every 6 (six) hours as needed., Disp: 30 tablet, Rfl: 0   tamsulosin (FLOMAX) 0.4 MG CAPS capsule, Take 1 capsule (0.4 mg total) by mouth at bedtime for 7 days., Disp: 7 capsule, Rfl: 0   norelgestromin-ethinyl estradiol (ORTHO EVRA) 150-35 MCG/24HR transdermal patch, Place 1 patch onto the skin once a week., Disp: 12 patch, Rfl: 4  Allergies  Allergen Reactions   Penicillins Hives    Has patient had a PCN reaction causing immediate rash, facial/tongue/throat swelling, SOB or lightheadedness with hypotension: No Has patient had a PCN reaction causing severe rash involving mucus  membranes or skin necrosis: No Has patient had a PCN reaction that required hospitalization No Has patient had a PCN reaction occurring within the last 10 years: No If all of the above answers are "NO", then may proceed with Cephalosporin  Has patient had a PCN reaction causing immediate rash, facial/tongue/throat swelling, SOB or lightheadedness with hypotension: No Has patient had a PCN reaction causing severe rash involving mucus membranes or skin necrosis: No Has patient had a PCN reaction that required hospitalization No Has patient had a PCN reaction occurring  within the last 10 years: No If all of the above answers are "NO", then may proceed with Cephalosporin    Ciprofloxacin Nausea And Vomiting   Metronidazole Hives    Patient states she does fine with the gel     ROS  As noted in HPI.   Physical Exam  BP (!) 145/86 (BP Location: Left Arm)   Pulse 86   Temp 97.7 F (36.5 C) (Oral)   Resp 18   LMP 01/24/2023 (Approximate)   SpO2 98%   Constitutional: Well developed, well nourished, no acute distress Eyes: PERRL, EOMI, conjunctiva normal bilaterally HENT: Normocephalic, atraumatic,mucus membranes moist Respiratory: Clear to auscultation bilaterally, no rales, no wheezing, no rhonchi.  No chest wall tenderness. Cardiovascular: Normal rate and rhythm, no murmurs, no gallops, no rubs GI: Soft, nondistended, normal bowel sounds, right flank and suprapubic tenderness, no rebound, no guarding Back: Right CVAT.  No paralumbar tenderness, no muscle spasm. No L spine, bony tenderness.  Bilateral lower extremities nontender, baseline ROM with intact DP pulses.pain with active hip flexion bilaterally.  No pain with passive int/ext rotation flex/extension hips bilaterally. SLR neg bilaterally. Sensation intact to light touch bilaterally over both legs, DTR's symmetric and intact bilaterally KJ , Motor symmetric bilateral 5/5 hip flexion, quadriceps, hamstrings, EHL, foot dorsiflexion, foot plantarflexion, gait normal skin: No rash, skin intact Musculoskeletal: No edema, calves symmetric, nontender neurologic: Alert & oriented x 3, CN III-XII grossly intact, no motor deficits, sensation grossly intact Psychiatric: Speech and behavior appropriate   ED Course   Medications - No data to display  Orders Placed This Encounter  Procedures   DG Chest 2 View    Standing Status:   Standing    Number of Occurrences:   1    Order Specific Question:   Reason for Exam (SYMPTOM  OR DIAGNOSIS REQUIRED)    Answer:   shortness of breath, right-sided low  chest pain.  Rule out pneumonia, pneumothorax, pleural effusion.   POCT urinalysis dipstick    Standing Status:   Standing    Number of Occurrences:   1   POCT urine pregnancy    Standing Status:   Standing    Number of Occurrences:   1   ED EKG    Standing Status:   Standing    Number of Occurrences:   1    Order Specific Question:   Reason for Exam    Answer:   Chest Pain   Results for orders placed or performed during the hospital encounter of 02/04/23 (from the past 24 hour(s))  POCT urinalysis dipstick     Status: Abnormal   Collection Time: 02/04/23  6:37 PM  Result Value Ref Range   Color, UA yellow yellow   Clarity, UA clear clear   Glucose, UA negative negative mg/dL   Bilirubin, UA negative negative   Ketones, POC UA negative negative mg/dL   Spec Grav, UA 1.610 9.604 - 1.025  Blood, UA trace-intact (A) negative   pH, UA 7.0 5.0 - 8.0   Protein Ur, POC negative negative mg/dL   Urobilinogen, UA 1.0 0.2 or 1.0 E.U./dL   Nitrite, UA Negative Negative   Leukocytes, UA Negative Negative  POCT urine pregnancy     Status: None   Collection Time: 02/04/23  6:37 PM  Result Value Ref Range   Preg Test, Ur Negative Negative   DG Chest 2 View  Result Date: 02/04/2023 CLINICAL DATA:  Shortness of breath, right-sided lower chest pain. EXAM: CHEST - 2 VIEW COMPARISON:  Chest radiograph 03/30/2021 FINDINGS: The cardiomediastinal silhouette is stable and within normal limits There is no focal consolidation or pulmonary edema. There is no pleural effusion or pneumothorax There is no acute osseous abnormality. IMPRESSION: No radiographic evidence of acute cardiopulmonary process. Electronically Signed   By: Lesia Hausen M.D.   On: 02/04/2023 19:22    ED Clinical Impression  1. SOB (shortness of breath)   2. Acute right-sided low back pain without sciatica   3. Flank pain   4. Hematuria, unspecified type      ED Assessment/Plan     1.  Shortness of breath.  She has no  history of pulmonary disease.  Lungs are clear, vitals are normal.  Checking EKG, chest x-ray.   Reviewed imaging independently.  No pneumonia, pulmonary edema, pleural effusion, pneumothorax. See radiology report for full details.  EKG: Normal sinus rhythm with a single PAC.  Normal axis, normal intervals.  No hypertrophy.  No ST-T wave changes.  No previous EKG for comparison.  EKG, chest x-ray reassuring.Her lungs are clear, vitals are normal.  Suspect anxiety especially given her recent increase stress level.  Patient is PERC negative.  Doubt PE.    2.  Back/flank pain.  In the differential is UTI, pyelonephritis, nephrolithiasis.  Doubt other intra-abdominal process.  She has no evidence of an acute abdomen.  Checking UA, urine pregnancy.  Urine pregnancy negative.  UA negative for UTI.  Positive for hematuria.  Patient denies vaginal bleeding.  Could be nephrolithiasis.  Unfortunately, we do not have the ability to do a KUB at this facility.  Will send home with Flomax, Tylenol/ibuprofen.  She also may have some musculoskeletal back pain.  Strict ER return precautions given.  Discussed labs, imaging, MDM, treatment plan, and plan for follow-up with patient.  Discussed sn/sx that should prompt return to the ED. patient agrees with plan.   Meds ordered this encounter  Medications   tamsulosin (FLOMAX) 0.4 MG CAPS capsule    Sig: Take 1 capsule (0.4 mg total) by mouth at bedtime for 7 days.    Dispense:  7 capsule    Refill:  0   ibuprofen (ADVIL) 600 MG tablet    Sig: Take 1 tablet (600 mg total) by mouth every 6 (six) hours as needed.    Dispense:  30 tablet    Refill:  0      *This clinic note was created using Scientist, clinical (histocompatibility and immunogenetics). Therefore, there may be occasional mistakes despite careful proofreading. ?    Domenick Gong, MD 02/04/23 1932

## 2023-02-04 NOTE — Discharge Instructions (Signed)
We will contact you if your chest x-ray comes back abnormal.  I did not see anything obvious.  You have a little bit of blood in your urine, I suspect you may have a kidney stone.  Take the Flomax, 600 mg of ibuprofen with 1000 mg of Tylenol 3 times a day.  This will help you pass the kidney stone and will also help with any musculoskeletal back pain that you may have.  Make sure you drink plenty of extra fluids.  Go to the ER for the signs and symptoms we discussed.

## 2023-02-05 ENCOUNTER — Ambulatory Visit: Payer: Medicaid Other

## 2023-02-10 ENCOUNTER — Telehealth: Payer: Self-pay

## 2023-02-10 NOTE — Telephone Encounter (Signed)
Patient called and stated that she was seen at [facility] for kidney stones and they referred her here for ultrasound. She is not a patient of ours. Has never been seen here. I advised her to reach out to her PCP for assistance with Ultrasound and follow up on kidney stones. She hung up while I was seeking advise. I tried to call back to make sure she verbalized understanding and there was no answer.

## 2023-02-11 DIAGNOSIS — Z8051 Family history of malignant neoplasm of kidney: Secondary | ICD-10-CM | POA: Diagnosis not present

## 2023-02-11 DIAGNOSIS — R109 Unspecified abdominal pain: Secondary | ICD-10-CM | POA: Diagnosis not present

## 2023-02-11 DIAGNOSIS — Z114 Encounter for screening for human immunodeficiency virus [HIV]: Secondary | ICD-10-CM | POA: Diagnosis not present

## 2023-02-11 DIAGNOSIS — R5383 Other fatigue: Secondary | ICD-10-CM | POA: Diagnosis not present

## 2023-02-11 DIAGNOSIS — Z87448 Personal history of other diseases of urinary system: Secondary | ICD-10-CM | POA: Diagnosis not present

## 2023-02-11 DIAGNOSIS — F509 Eating disorder, unspecified: Secondary | ICD-10-CM | POA: Diagnosis not present

## 2023-02-11 DIAGNOSIS — Z13220 Encounter for screening for lipoid disorders: Secondary | ICD-10-CM | POA: Diagnosis not present

## 2023-02-11 DIAGNOSIS — Z130 Encounter for screening for diseases of the blood and blood-forming organs and certain disorders involving the immune mechanism: Secondary | ICD-10-CM | POA: Diagnosis not present

## 2023-02-11 DIAGNOSIS — R030 Elevated blood-pressure reading, without diagnosis of hypertension: Secondary | ICD-10-CM | POA: Diagnosis not present

## 2023-02-28 ENCOUNTER — Telehealth: Payer: Medicaid Other | Admitting: Nurse Practitioner

## 2023-02-28 DIAGNOSIS — N760 Acute vaginitis: Secondary | ICD-10-CM

## 2023-02-28 DIAGNOSIS — B9689 Other specified bacterial agents as the cause of diseases classified elsewhere: Secondary | ICD-10-CM

## 2023-02-28 DIAGNOSIS — T3695XA Adverse effect of unspecified systemic antibiotic, initial encounter: Secondary | ICD-10-CM | POA: Diagnosis not present

## 2023-02-28 DIAGNOSIS — B379 Candidiasis, unspecified: Secondary | ICD-10-CM | POA: Diagnosis not present

## 2023-02-28 MED ORDER — METRONIDAZOLE 0.75 % VA GEL
1.0000 | Freq: Every day | VAGINAL | 0 refills | Status: AC
Start: 2023-02-28 — End: 2023-03-07

## 2023-02-28 MED ORDER — FLUCONAZOLE 150 MG PO TABS
150.0000 mg | ORAL_TABLET | Freq: Once | ORAL | 0 refills | Status: AC
Start: 2023-02-28 — End: 2023-02-28

## 2023-02-28 NOTE — Progress Notes (Signed)
Virtual Visit Consent   Robyn Willis, you are scheduled for a virtual visit with a [facility] provider today. Just as with appointments in the office, your consent must be obtained to participate. Your consent will be active for this visit and any virtual visit you may have with one of our providers in the next 365 days. If you have a MyChart account, a copy of this consent can be sent to you electronically.  As this is a virtual visit, video technology does not allow for your provider to perform a traditional examination. This may limit your provider's ability to fully assess your condition. If your provider identifies any concerns that need to be evaluated in person or the need to arrange testing (such as labs, EKG, etc.), we will make arrangements to do so. Although advances in technology are sophisticated, we cannot ensure that it will always work on either your end or our end. If the connection with a video visit is poor, the visit may have to be switched to a telephone visit. With either a video or telephone visit, we are not always able to ensure that we have a secure connection.  By engaging in this virtual visit, you consent to the provision of healthcare and authorize for your insurance to be billed (if applicable) for the services provided during this visit. Depending on your insurance coverage, you may receive a charge related to this service.  I need to obtain your verbal consent now. Are you willing to proceed with your visit today? Robyn Willis has provided verbal consent on 02/28/2023 for a virtual visit (video or telephone). Viviano Simas, FNP  Date: 02/28/2023 3:00 PM  Virtual Visit via Video Note   I, Viviano Simas, connected with  Robyn Willis  ([id number], 1990/12/02) on 02/28/23 at  3:00 PM EDT by a video-enabled telemedicine application and verified that I am speaking with the correct person using two identifiers.  Location: Patient: Virtual Visit Location Patient:  Home Provider: Virtual Visit Location Provider: Home Office   I discussed the limitations of evaluation and management by telemedicine and the availability of in person appointments. The patient expressed understanding and agreed to proceed.    History of Present Illness: Robyn Willis is a 32 y.o. who identifies as a female who was assigned female at birth, and is being seen today for vaginal symptoms   She has had recurrent BV and yeast infections She has had the same partner for 4 years  Denies risk for STI   Most recent treatment was in June for BV   Symptoms today include white cloudy discharge  The discharge has an odor to it   She has not been on any antibiotics since her treatment in June  Denies any other new medications or supplements    Problems:  Patient Active Problem List   Diagnosis Date Noted   SVD (spontaneous vaginal delivery) 02/15/2017   Normal labor 02/14/2017   Chlamydia infection complicating pregnancy, third trimester 02/07/2017   IUGR (intrauterine growth restriction) affecting care of mother Low AC <10%ile 01/08/2017   Short cervix, antepartum 10/08/2016   Hives 09/10/2016   Marijuana use 08/15/2016   Depression 08/13/2016   Supervision of high risk pregnancy, antepartum 01/02/2016    Allergies:  Allergies  Allergen Reactions   Penicillins Hives    Has patient had a PCN reaction causing immediate rash, facial/tongue/throat swelling, SOB or lightheadedness with hypotension: No Has patient had a PCN reaction causing severe rash involving  mucus membranes or skin necrosis: No Has patient had a PCN reaction that required hospitalization No Has patient had a PCN reaction occurring within the last 10 years: No If all of the above answers are "NO", then may proceed with Cephalosporin  Has patient had a PCN reaction causing immediate rash, facial/tongue/throat swelling, SOB or lightheadedness with hypotension: No Has patient had a PCN reaction causing  severe rash involving mucus membranes or skin necrosis: No Has patient had a PCN reaction that required hospitalization No Has patient had a PCN reaction occurring within the last 10 years: No If all of the above answers are "NO", then may proceed with Cephalosporin    Ciprofloxacin Nausea And Vomiting   Metronidazole Hives    Patient states she does fine with the gel   Medications:  Current Outpatient Medications:    ibuprofen (ADVIL) 600 MG tablet, Take 1 tablet (600 mg total) by mouth every 6 (six) hours as needed., Disp: 30 tablet, Rfl: 0   norelgestromin-ethinyl estradiol (ORTHO EVRA) 150-35 MCG/24HR transdermal patch, Place 1 patch onto the skin once a week., Disp: 12 patch, Rfl: 4  Observations/Objective: Patient is well-developed, well-nourished in no acute distress.  Resting comfortably  at home.  Head is normocephalic, atraumatic.  No labored breathing.  Speech is clear and coherent with logical content.  Patient is alert and oriented at baseline.    Assessment and Plan: 1. Bacterial vaginitis  - metroNIDAZOLE (METROGEL) 0.75 % vaginal gel; Place 1 Applicatorful vaginally at bedtime for 7 days.  Dispense: 70 g; Refill: 0  2. Antibiotic-induced yeast infection  - fluconazole (DIFLUCAN) 150 MG tablet; Take 1 tablet (150 mg total) by mouth once for 1 dose.  Dispense: 1 tablet; Refill: 0     Follow Up Instructions: I discussed the assessment and treatment plan with the patient. The patient was provided an opportunity to ask questions and all were answered. The patient agreed with the plan and demonstrated an understanding of the instructions.  A copy of instructions were sent to the patient via MyChart unless otherwise noted below.    The patient was advised to call back or seek an in-person evaluation if the symptoms worsen or if the condition fails to improve as anticipated.  Time:  I spent 15 minutes with the patient via telehealth technology discussing the above  problems/concerns.    Viviano Simas, FNP

## 2023-03-31 DIAGNOSIS — R051 Acute cough: Secondary | ICD-10-CM | POA: Diagnosis not present

## 2023-03-31 DIAGNOSIS — U071 COVID-19: Secondary | ICD-10-CM | POA: Diagnosis not present

## 2023-05-11 ENCOUNTER — Telehealth: Payer: Medicaid Other | Admitting: Family Medicine

## 2023-05-11 DIAGNOSIS — Z8759 Personal history of other complications of pregnancy, childbirth and the puerperium: Secondary | ICD-10-CM | POA: Diagnosis not present

## 2023-05-11 DIAGNOSIS — N926 Irregular menstruation, unspecified: Secondary | ICD-10-CM | POA: Diagnosis not present

## 2023-05-11 NOTE — Patient Instructions (Signed)
Abnormal Uterine Bleeding  Abnormal uterine bleeding is unusual bleeding from the uterus. It includes bleeding after sex, or bleeding or spotting between menstrual periods. It may also include bleeding that is heavier than normal, menstrual periods that last longer than usual, or bleeding that occurs after menopause. Abnormal uterine bleeding can affect teenagers, women in their reproductive years, pregnant women, and women who have reached menopause. Common causes of abnormal uterine bleeding include: Pregnancy. Abnormal growths within the lining of the uterus (polyps). Benign tumors or growths in the uterus (fibroids). These are not cancer. Infection. Cancer. Too much or too little of some hormones in the body (hormonal imbalances). Any type of abnormal bleeding should be checked by a health care provider. Many cases are minor and simple to treat, but others may be more serious. Treatment will depend on the cause of the bleeding and how severe it is. Follow these instructions at home: Medicines Take over-the-counter and prescription medicines only as told by your health care provider. Ask your health care provider about: Taking medicines such as aspirin and ibuprofen. These medicines can thin your blood. Do not take these medicines unless your health care provider tells you to take them. Taking over-the-counter medicines, vitamins, herbs, and supplements. If you were prescribed iron pills, take them as told by your health care provider. Iron pills help to replace iron that your body loses because of this condition. Managing constipation In cases of severe bleeding, you may be asked to increase your iron intake to treat anemia. Doing this may cause constipation. To prevent or treat constipation, you may need to: Drink enough fluid to keep your urine pale yellow. Take over-the-counter or prescription medicines. Eat foods that are high in fiber, such as beans, whole grains, and fresh fruits and  vegetables. Limit foods that are high in fat and processed sugars, such as fried or sweet foods. Activity Alter your activity to decrease bleeding if you need to change your sanitary pad more than one time every 2 hours: Lie in bed with your feet raised (elevated). Place a cold pack on your lower abdomen. Rest as much as possible until the bleeding stops or slows down. General instructions Do not use tampons, douche, or have sex until your health care provider says these things are okay. Change your sanitary pads often. Get regular exams. These include pelvic exams and cervical cancer screenings. It is up to you to get the results of any tests that are done. Ask your health care provider, or the department that is doing the tests, when your results will be ready. Monitor your condition for any changes. For 2 months, write down: When your menstrual period starts. When your menstrual period ends. When any abnormal vaginal bleeding occurs. What problems you notice. Keep all follow-up visits. This is important. Contact a health care provider if: You have bleeding that lasts for more than one week. You feel dizzy at times. You feel nauseous or you vomit. You feel light-headed or weak. You notice any other changes that show that your condition is getting worse. Get help right away if: You faint. You have bleeding that soaks through a sanitary pad every hour. You have pain in the abdomen. You have a fever or chills. You become sweaty or weak. You pass large blood clots from your vagina. These symptoms may represent a serious problem that is an emergency. Do not wait to see if the symptoms will go away. Get medical help right away. Call your local emergency services (  911 in the [place] Do not drive yourself to the hospital. Summary Abnormal uterine bleeding is unusual bleeding from the uterus. Any type of abnormal bleeding should be checked by a health care provider. Many cases are minor and  simple to treat, but others may be more serious. Treatment will depend on the cause of the bleeding and how severe it is. Get help right away if you faint, you have bleeding that soaks through a sanitary pad every hour, or you pass large blood clots from your vagina. This information is not intended to replace advice given to you by your health care provider. Make sure you discuss any questions you have with your health care provider. Document Revised: 11/07/2020 Document Reviewed: 11/07/2020 Elsevier Patient Education  2024 [employer].

## 2023-05-11 NOTE — Progress Notes (Signed)
Virtual Visit Consent   Robyn Willis, you are scheduled for a virtual visit with a [facility] provider today. Just as with appointments in the office, your consent must be obtained to participate. Your consent will be active for this visit and any virtual visit you may have with one of our providers in the next 365 days. If you have a MyChart account, a copy of this consent can be sent to you electronically.  As this is a virtual visit, video technology does not allow for your provider to perform a traditional examination. This may limit your provider's ability to fully assess your condition. If your provider identifies any concerns that need to be evaluated in person or the need to arrange testing (such as labs, EKG, etc.), we will make arrangements to do so. Although advances in technology are sophisticated, we cannot ensure that it will always work on either your end or our end. If the connection with a video visit is poor, the visit may have to be switched to a telephone visit. With either a video or telephone visit, we are not always able to ensure that we have a secure connection.  By engaging in this virtual visit, you consent to the provision of healthcare and authorize for your insurance to be billed (if applicable) for the services provided during this visit. Depending on your insurance coverage, you may receive a charge related to this service.  I need to obtain your verbal consent now. Are you willing to proceed with your visit today? ADELAYDE BESTOR has provided verbal consent on 05/11/2023 for a virtual visit (video or telephone). Georgana Curio, FNP  Date: 05/11/2023 11:01 AM  Virtual Visit via Video Note   I, Georgana Curio, connected with  THERA ROSEKRANS  ([id number], [birth year]) on 05/11/23 at 11:00 AM EDT by a video-enabled telemedicine application and verified that I am speaking with the correct person using two identifiers.  Location: Patient: Home Provider: Virtual Visit  Location Provider: Home Office   I discussed the limitations of evaluation and management by telemedicine and the availability of in person appointments. The patient expressed understanding and agreed to proceed.    History of Present Illness: Robyn Willis is a 32 y.o. who identifies as a female who was assigned female at birth, and is being seen today for menstrual bleeding since taking abortion pill 2 weeks ago. She has had heavy bleeding and clotting. No fever. Cramping. No weakness or history of anemia.  Marland Kitchen  HPI: HPI  Problems:  Patient Active Problem List   Diagnosis Date Noted   SVD (spontaneous vaginal delivery) 02/15/2017   Normal labor 02/14/2017   Chlamydia infection complicating pregnancy, third trimester 02/07/2017   IUGR (intrauterine growth restriction) affecting care of mother Low AC <10%ile 01/08/2017   Short cervix, antepartum 10/08/2016   Hives 09/10/2016   Marijuana use 08/15/2016   Depression 08/13/2016   Supervision of high risk pregnancy, antepartum 01/02/2016    Allergies:  Allergies  Allergen Reactions   Penicillins Hives    Has patient had a PCN reaction causing immediate rash, facial/tongue/throat swelling, SOB or lightheadedness with hypotension: No Has patient had a PCN reaction causing severe rash involving mucus membranes or skin necrosis: No Has patient had a PCN reaction that required hospitalization No Has patient had a PCN reaction occurring within the last 10 years: No If all of the above answers are "NO", then may proceed with Cephalosporin  Has patient had a PCN reaction causing immediate  rash, facial/tongue/throat swelling, SOB or lightheadedness with hypotension: No Has patient had a PCN reaction causing severe rash involving mucus membranes or skin necrosis: No Has patient had a PCN reaction that required hospitalization No Has patient had a PCN reaction occurring within the last 10 years: No If all of the above answers are "NO", then may  proceed with Cephalosporin    Ciprofloxacin Nausea And Vomiting   Metronidazole Hives    Patient states she does fine with the gel   Medications:  Current Outpatient Medications:    ibuprofen (ADVIL) 600 MG tablet, Take 1 tablet (600 mg total) by mouth every 6 (six) hours as needed., Disp: 30 tablet, Rfl: 0   norelgestromin-ethinyl estradiol (ORTHO EVRA) 150-35 MCG/24HR transdermal patch, Place 1 patch onto the skin once a week., Disp: 12 patch, Rfl: 4  Observations/Objective: Patient is well-developed, well-nourished in no acute distress.  Resting comfortably  at home.  Head is normocephalic, atraumatic.  No labored breathing.  Speech is clear and coherent with logical content.  Patient is alert and oriented at baseline.    Assessment and Plan: 1. Abnormal menstrual cycle  2. Abortion history  Follow up with OB GYN this week, ED if sx worsen.   Follow Up Instructions: I discussed the assessment and treatment plan with the patient. The patient was provided an opportunity to ask questions and all were answered. The patient agreed with the plan and demonstrated an understanding of the instructions.  A copy of instructions were sent to the patient via MyChart unless otherwise noted below.     The patient was advised to call back or seek an in-person evaluation if the symptoms worsen or if the condition fails to improve as anticipated.    Georgana Curio, FNP

## 2023-05-13 ENCOUNTER — Emergency Department
Admission: EM | Admit: 2023-05-13 | Discharge: 2023-05-14 | Disposition: A | Discharge status: Home / Self Care | Payer: Medicaid Other | Attending: Emergency Medicine | Admitting: Emergency Medicine

## 2023-05-13 ENCOUNTER — Emergency Department: Payer: Medicaid Other

## 2023-05-13 DIAGNOSIS — Z3A01 Less than 8 weeks gestation of pregnancy: Secondary | ICD-10-CM | POA: Diagnosis not present

## 2023-05-13 DIAGNOSIS — O209 Hemorrhage in early pregnancy, unspecified: Secondary | ICD-10-CM | POA: Diagnosis not present

## 2023-05-13 DIAGNOSIS — N938 Other specified abnormal uterine and vaginal bleeding: Secondary | ICD-10-CM | POA: Diagnosis not present

## 2023-05-13 DIAGNOSIS — N939 Abnormal uterine and vaginal bleeding, unspecified: Secondary | ICD-10-CM | POA: Diagnosis not present

## 2023-05-13 DIAGNOSIS — D500 Iron deficiency anemia secondary to blood loss (chronic): Secondary | ICD-10-CM | POA: Diagnosis not present

## 2023-05-13 LAB — HCG, QUANTITATIVE, PREGNANCY: hCG, Beta Chain, Quant, S: 2054 m[IU]/mL — ABNORMAL HIGH (ref <5)

## 2023-05-13 LAB — BASIC METABOLIC PANEL
Anion gap: 9 (ref 5–15)
BUN: 8 mg/dL (ref 6–20)
CO2: 22 mmol/L (ref 22–32)
Calcium: 8.4 mg/dL — ABNORMAL LOW (ref 8.9–10.3)
Chloride: 106 mmol/L (ref 98–111)
Creatinine, Ser: 0.55 mg/dL (ref 0.44–1.00)
GFR, Estimated: >60 mL/min (ref >60)
Glucose, Bld: 105 mg/dL — ABNORMAL HIGH (ref 70–99)
Potassium: 3.4 mmol/L — ABNORMAL LOW (ref 3.5–5.1)
Sodium: 137 mmol/L (ref 135–145)

## 2023-05-13 LAB — TYPE AND SCREEN
ABO/RH(D): A POS
Antibody Screen: NEGATIVE

## 2023-05-13 LAB — CBC
HCT: 32 % — ABNORMAL LOW (ref 36.0–46.0)
HCT: 31.7 % — ABNORMAL LOW (ref 36.0–46.0)
Hemoglobin: 11 g/dL — ABNORMAL LOW (ref 12.0–15.0)
Hemoglobin: 10.4 g/dL — ABNORMAL LOW (ref 12.0–15.0)
MCH: 32 pg (ref 26.0–34.0)
MCH: 31.2 pg (ref 26.0–34.0)
MCHC: 34.4 g/dL (ref 30.0–36.0)
MCHC: 32.8 g/dL (ref 30.0–36.0)
MCV: 93 fL (ref 80.0–100.0)
MCV: 95.2 fL (ref 80.0–100.0)
Platelets: 420 10*3/uL — ABNORMAL HIGH (ref 150–400)
Platelets: 366 10*3/uL (ref 150–400)
RBC: 3.44 MIL/uL — ABNORMAL LOW (ref 3.87–5.11)
RBC: 3.33 MIL/uL — ABNORMAL LOW (ref 3.87–5.11)
RDW: 11.9 % (ref 11.5–15.5)
RDW: 12.1 % (ref 11.5–15.5)
WBC: 9.9 10*3/uL (ref 4.0–10.5)
WBC: 9.2 10*3/uL (ref 4.0–10.5)
nRBC: 0 % (ref 0.0–0.2)
nRBC: 0 % (ref 0.0–0.2)

## 2023-05-13 MED ORDER — METHYLERGONOVINE MALEATE 0.2 MG/ML IJ SOLN
0.2000 mg | Freq: Once | INTRAMUSCULAR | Status: AC
  Administered 2023-05-13: 0.2 mg via INTRAMUSCULAR
  Filled 2023-05-13: qty 1

## 2023-05-13 MED ORDER — MISOPROSTOL 200 MCG PO TABS
800.0000 ug | ORAL_TABLET | Freq: Once | ORAL | Status: AC
  Administered 2023-05-13: 800 ug via ORAL
  Filled 2023-05-13: qty 4

## 2023-05-13 NOTE — ED Provider Notes (Signed)
[facility] Provider Note    Event Date/Time   First MD Initiated Contact with Patient 05/13/23 1714     (approximate)  History   Chief Complaint: Vaginal Bleeding  HPI  Robyn Willis is a 32 y.o. female with a past medical history of anxiety who presents to the emergency department for heavy vaginal bleeding.  According to the patient she is approximately [redacted] weeks pregnant on 10/7 and took medication for a medical abortion 10/7 and 10/8.  Patient states since 10/states she has had mild vaginal bleeding until this morning when she has been experiencing heavy vaginal bleeding with occasional clots.  Patient denies any abdominal pain or cramping.  Patient did feel somewhat dizzy today.  Patient states some the clots have been fist sized, she was concerned so she came to the emergency department for evaluation.  Physical Exam   Triage Vital Signs: ED Triage Vitals  Encounter Vitals Group     BP 05/13/23 1658 (!) 161/94     Systolic BP Percentile --      Diastolic BP Percentile --      Pulse Rate 05/13/23 1658 (!) 115     Resp 05/13/23 1658 18     Temp 05/13/23 1658 98.2 F (36.8 C)     Temp Source 05/13/23 1658 Oral     SpO2 05/13/23 1658 98 %     Weight 05/13/23 1657 112 lb (50.8 kg)     Height 05/13/23 1657 5\' 4"  (1.626 m)     Head Circumference --      Peak Flow --      Pain Score 05/13/23 1656 0     Pain Loc --      Pain Education --      Exclude from Growth Chart --     Most recent vital signs: Vitals:   05/13/23 1658  BP: (!) 161/94  Pulse: (!) 115  Resp: 18  Temp: 98.2 F (36.8 C)  SpO2: 98%    General: Awake, no distress.  CV:  Good peripheral perfusion.  Regular rate and rhythm  Resp:  Normal effort.  Equal breath sounds bilaterally.  Abd:  No distention.  Soft, nontender.  No rebound or guarding.  Benign abdomen.  ED Results / Procedures / Treatments   RADIOLOGY  Ultrasound shows thickened endometrium 10 mm in thickness with  hypervascularity.   MEDICATIONS ORDERED IN ED: Medications - No data to display   IMPRESSION / MDM / ASSESSMENT AND PLAN / ED COURSE  I reviewed the triage vital signs and the nursing notes.  Patient's presentation is most consistent with acute presentation with potential threat to life or bodily function.  Patient presents to the emergency department for dizziness and heavy vaginal bleeding after a medical abortion.  Patient states he was approximately [redacted] weeks pregnant when she took the medication 10/7 and 10/8.  Has had mild bleeding since then and now heavy bleeding today with large clots.  As the patient felt dizzy she was concerned so he came to the emergency department for evaluation.  Patient's hemoglobin today is 11.0 on her CBC which is a 2 point drop from prior.  Chemistry and quantitative beta-hCG is pending.  Will obtain an ultrasound to further evaluate and then likely discussed with OB once her ultrasound has resulted.  Patient overall appears well, benign abdominal exam.  Patient ambulatory to the restroom without issue.  Patient with continued vaginal bleeding.  She had made a fist sized clot  that she passed in the toilet.  Patient has been here now 3 hours we will repeat a CBC to see if there is any drop in hemoglobin.  Patient's ultrasound has been completed but we continue to await for radiology read.  I spoke to Haroldine Laws of [place] who will be discussing the patient with Dr. Feliberto Gottron, and have her take a look at the images to see if this patient would need a D&C or other urgent procedure.  OB is ordered the patient brought Methergine as well as Cytotec.  We will monitor the patient for the next hour or 2 and see if the bleeding improves.  If the bleeding improves patient could be discharged home with outpatient follow-up.  If the bleeding does not improve we will discuss with OB for possible D&C.  Patient states some improvement in bleeding although states he just got up  and passed another clot although states this is the first time she has gotten up since receiving the medications.  Will monitor another 30 to 45 minutes and reassess to see if the bleeding has slowed if it has patient will be discharged home, if not we will discuss with OB/GYN for possible D&C.  Patient now states she is having heavy bleeding once again.  We will discuss with OB/GYN for further intervention.  FINAL CLINICAL IMPRESSION(S) / ED DIAGNOSES   Vaginal bleeding   Note:  This document was prepared using Dragon voice recognition software and may include unintentional dictation errors.   [name], MD 05/13/23 [id number]

## 2023-05-13 NOTE — ED Notes (Signed)
Pt in US

## 2023-05-13 NOTE — ED Notes (Signed)
Pt up and down to bathroom several times independently. Gait steady, NAD.
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
## 2023-05-13 NOTE — Discharge Instructions (Addendum)
I expect the bleeding to continue to decrease. Some cramping is expected. Passing some blood clots can be normal right now. If you are soaking through a pad in less than an hour for more than 2 hours in a row, you need to come back to the emergency room.   Take ibuprofen 600-800 mg every 8 hours for a few days.    Plan to [facility] with [facility] Thursday.

## 2023-05-13 NOTE — ED Triage Notes (Signed)
Pt presents to the ED POV from home by self. Pt reports having a miscarriage and being given an abortion pill after to help pass the misconception products. Pt states that this occurred on 10/8. Pt states that she has been bleeding since and that today she started bleeding very heavy. Pt reports going through 4-5 maxi pads today and is concerned about the blood loss. Pt A&Ox4 at time of triage.   Pt requests that no one receive information about her visit.  Green top sent to lab.

## 2023-05-13 NOTE — ED Notes (Signed)
Pt. Provided several more pads on request.

## 2023-05-13 NOTE — ED Notes (Signed)
Pt reporting bleeding has significantly improved- passed one large clot and having minor bleeding at this time

## 2023-05-13 NOTE — ED Notes (Signed)
Pt. Is very upset at wait time, and that she has not been seen by OB/GYN. This RN empathizes with pt. Explains that ED MD has plan to consult OB/GYN once pt's results have returned. This RN speaks with Dr. Lenard Lance about pt's condition and poc. Dr. Lenard Lance states he awaits [place] read, and repeat CBC results. This RN will update pt.

## 2023-05-14 DIAGNOSIS — R58 Hemorrhage, not elsewhere classified: Secondary | ICD-10-CM | POA: Diagnosis not present

## 2023-05-14 MED ORDER — METHYLERGONOVINE MALEATE 0.2 MG PO TABS: 0.2000 mg | ORAL_TABLET | Freq: Four times a day (QID) | ORAL | 0 refills | Status: AC

## 2023-05-14 MED ORDER — METHYLERGONOVINE MALEATE 0.2 MG PO TABS
0.2000 mg | ORAL_TABLET | Freq: Once | ORAL | Status: AC
  Administered 2023-05-14: 0.2 mg via ORAL
  Filled 2023-05-14: qty 1

## 2023-05-14 NOTE — ED Provider Notes (Signed)
-----------------------------------------   12:30 AM on 05/14/2023 -----------------------------------------   Patient was evaluated in the ED and discharged by Dr. Algis Downs. Schermerhorn as her bleeding had lessened.   Irean Hong, MD 05/14/23 [phone]

## 2023-05-26 ENCOUNTER — Encounter: Payer: Medicaid Other | Admitting: Licensed Practical Nurse

## 2023-06-03 ENCOUNTER — Telehealth: Payer: Medicaid Other | Admitting: Physician Assistant

## 2023-06-03 NOTE — Progress Notes (Signed)
The patient no-showed for appointment despite this provider sending direct link, reaching out via phone with no response and waiting for at least 10 minutes from appointment time for patient to join. They will be marked as a NS for this appointment/time.   Laure Kidney, PA-C

## 2023-06-24 DIAGNOSIS — Z30011 Encounter for initial prescription of contraceptive pills: Secondary | ICD-10-CM | POA: Diagnosis not present

## 2023-06-24 DIAGNOSIS — Z3201 Encounter for pregnancy test, result positive: Secondary | ICD-10-CM | POA: Diagnosis not present

## 2023-06-24 DIAGNOSIS — Z681 Body mass index (BMI) 19 or less, adult: Secondary | ICD-10-CM | POA: Diagnosis not present

## 2023-06-24 DIAGNOSIS — Z113 Encounter for screening for infections with a predominantly sexual mode of transmission: Secondary | ICD-10-CM | POA: Diagnosis not present

## 2023-06-24 DIAGNOSIS — Z01419 Encounter for gynecological examination (general) (routine) without abnormal findings: Secondary | ICD-10-CM | POA: Diagnosis not present

## 2023-06-24 DIAGNOSIS — O039 Complete or unspecified spontaneous abortion without complication: Secondary | ICD-10-CM | POA: Diagnosis not present

## 2023-06-24 DIAGNOSIS — Z124 Encounter for screening for malignant neoplasm of cervix: Secondary | ICD-10-CM | POA: Diagnosis not present

## 2023-07-17 ENCOUNTER — Encounter: Payer: Medicaid Other | Admitting: Licensed Practical Nurse

## 2023-07-24 ENCOUNTER — Encounter: Payer: Self-pay | Admitting: Licensed Practical Nurse

## 2023-09-03 IMAGING — DX DG CHEST 1V PORT
1 series · 1 of 1 positions shown · non-contrast
Comparison: 09/29/2018

CLINICAL DATA: Shortness of breath.  Chest pain and cold symptoms.

EXAM:
PORTABLE CHEST 1 VIEW

[chest ap]
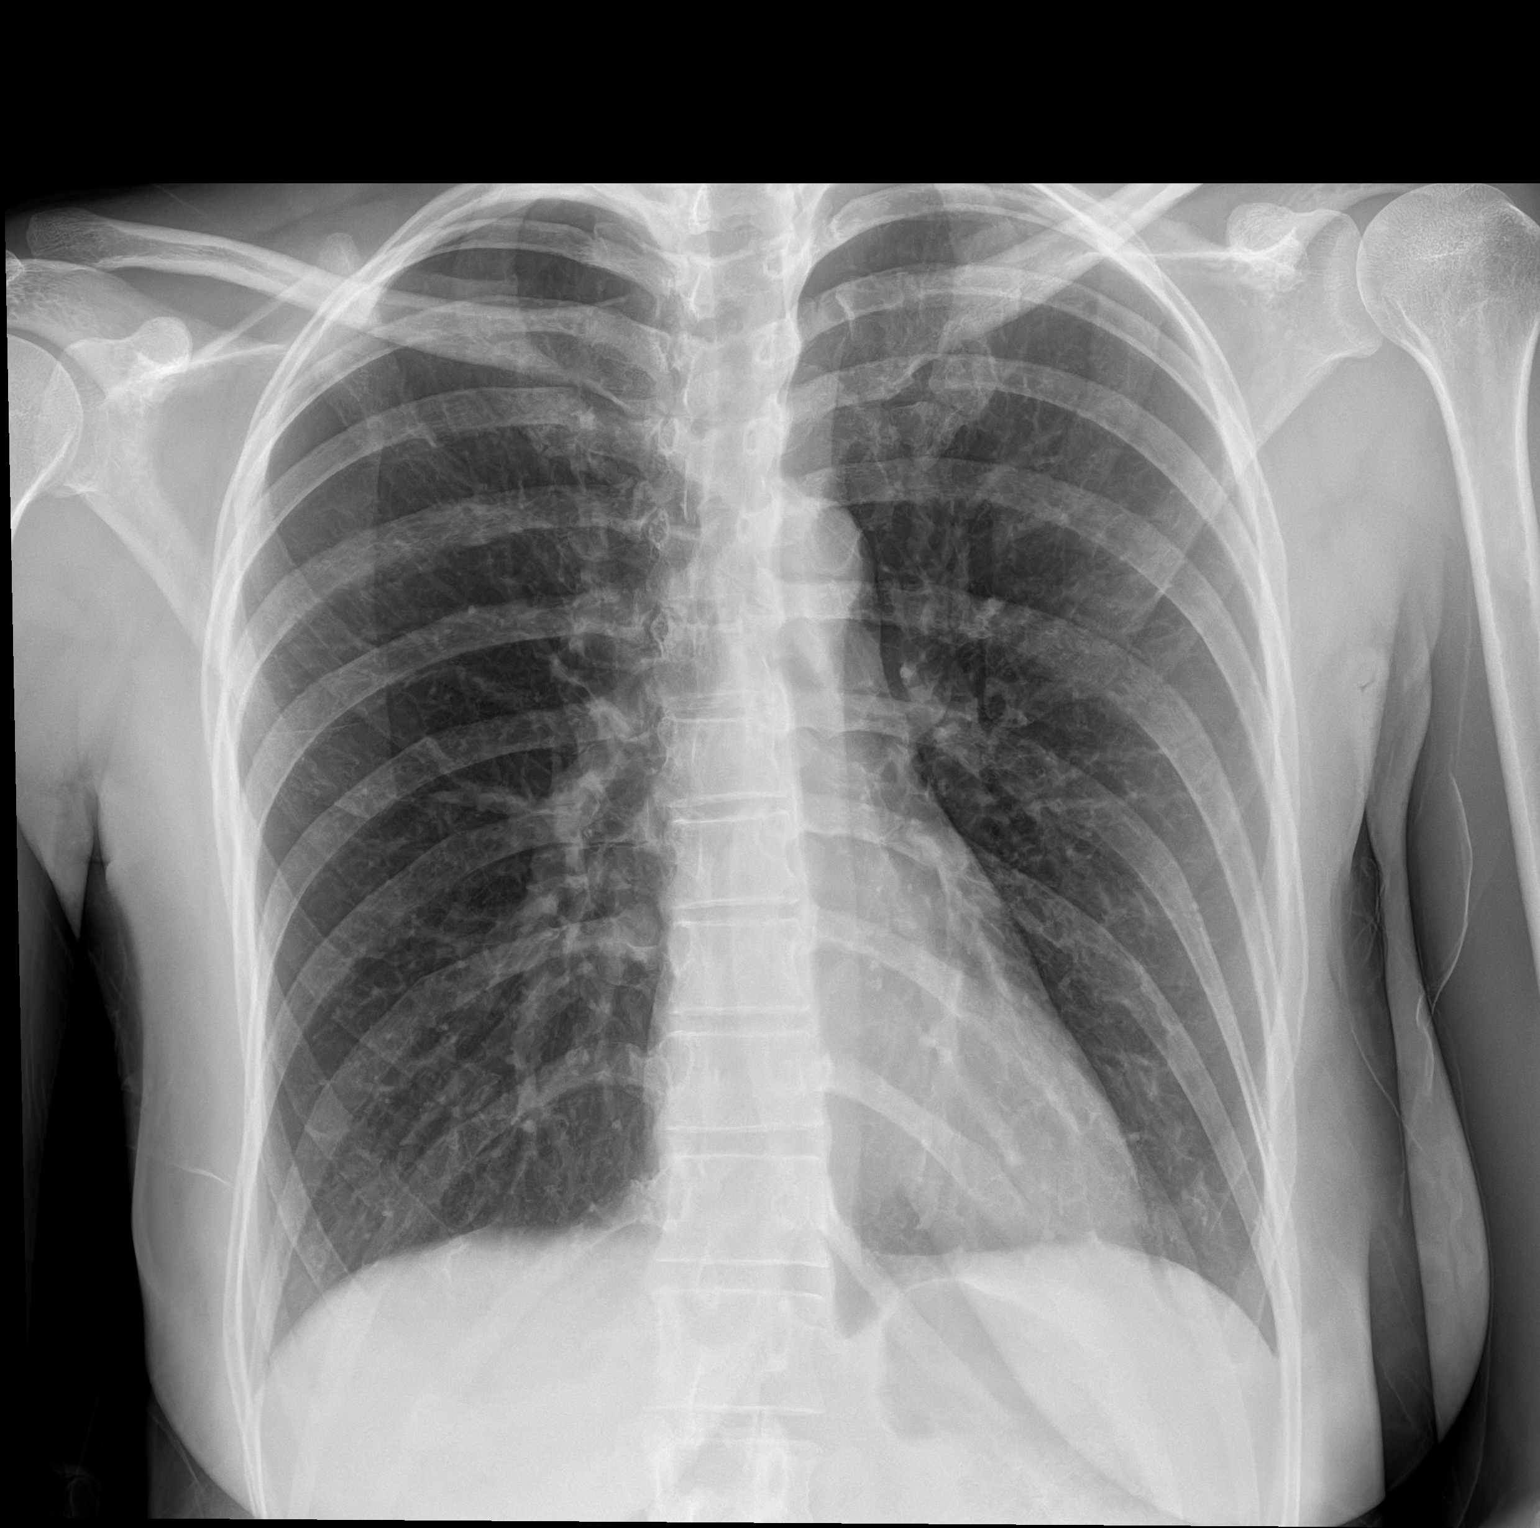

[1 of 1 positions shown; findings below may reference images not displayed]

FINDINGS: Single-view of the chest demonstrates clear lungs. Heart and
mediastinum are within normal limits. Negative for a pneumothorax.
No acute bone abnormality.
IMPRESSION: No active disease.

## 2023-09-10 DIAGNOSIS — Z131 Encounter for screening for diabetes mellitus: Secondary | ICD-10-CM | POA: Diagnosis not present

## 2023-09-10 DIAGNOSIS — Z113 Encounter for screening for infections with a predominantly sexual mode of transmission: Secondary | ICD-10-CM | POA: Diagnosis not present

## 2023-09-10 DIAGNOSIS — L659 Nonscarring hair loss, unspecified: Secondary | ICD-10-CM | POA: Diagnosis not present

## 2023-09-10 DIAGNOSIS — Z136 Encounter for screening for cardiovascular disorders: Secondary | ICD-10-CM | POA: Diagnosis not present

## 2023-09-10 DIAGNOSIS — N920 Excessive and frequent menstruation with regular cycle: Secondary | ICD-10-CM | POA: Diagnosis not present

## 2023-09-29 DIAGNOSIS — Z332 Encounter for elective termination of pregnancy: Secondary | ICD-10-CM | POA: Diagnosis not present

## 2023-10-02 DIAGNOSIS — Z332 Encounter for elective termination of pregnancy: Secondary | ICD-10-CM | POA: Diagnosis not present

## 2023-11-03 DIAGNOSIS — F329 Major depressive disorder, single episode, unspecified: Secondary | ICD-10-CM | POA: Diagnosis not present

## 2023-11-20 ENCOUNTER — Telehealth: Admitting: Physician Assistant

## 2023-11-20 DIAGNOSIS — B9689 Other specified bacterial agents as the cause of diseases classified elsewhere: Secondary | ICD-10-CM | POA: Diagnosis not present

## 2023-11-20 DIAGNOSIS — N760 Acute vaginitis: Secondary | ICD-10-CM

## 2023-11-20 MED ORDER — CLINDAMYCIN HCL 300 MG PO CAPS
300.0000 mg | ORAL_CAPSULE | Freq: Two times a day (BID) | ORAL | 0 refills | Status: AC
Start: 2023-11-20 — End: 2023-11-27

## 2023-11-20 NOTE — Patient Instructions (Signed)
 Robyn Willis, thank you for joining Angelia Kelp, PA-C for today's virtual visit.  While this provider is not your primary care provider (PCP), if your PCP is located in our provider database this encounter information will be shared with them immediately following your visit.   A [facility] MyChart account gives you access to today's visit and all your visits, tests, and labs performed at [facility] " click here if you don't have a [facility] MyChart account or go to [email or web address]  Consent: (Patient) Robyn Willis provided verbal consent for this virtual visit at the beginning of the encounter.  Current Medications:  Current Outpatient Medications:    clindamycin  (CLEOCIN ) 300 MG capsule, Take 1 capsule (300 mg total) by mouth 2 (two) times daily for 7 days., Disp: 14 capsule, Rfl: 0   ibuprofen  (ADVIL ) 600 MG tablet, Take 1 tablet (600 mg total) by mouth every 6 (six) hours as needed., Disp: 30 tablet, Rfl: 0   norelgestromin -ethinyl estradiol  (ORTHO EVRA) 150-35 MCG/24HR transdermal patch, Place 1 patch onto the skin once a week., Disp: 12 patch, Rfl: 4   Medications ordered in this encounter:  Meds ordered this encounter  Medications   clindamycin  (CLEOCIN ) 300 MG capsule    Sig: Take 1 capsule (300 mg total) by mouth 2 (two) times daily for 7 days.    Dispense:  14 capsule    Refill:  0    Supervising Provider:   Corine Dice [[id number]]     *If you need refills on other medications prior to your next appointment, please contact your pharmacy*  Follow-Up: Call back or seek an in-person evaluation if the symptoms worsen or if the condition fails to improve as anticipated.  [facility] [phone]  Other Instructions Vaginal Probiotics: AZO vaginal probiotic OLLY Happy Hoo-Ha RAW Vaginal Care RenewLife Women's vaginal probiotic RepHresh Pro-B  Vaginal washes: Honey Pot Summer's Eve Vagisil Feminine  cleanser  Boric Acid Suppositories  Healthy vaginal hygiene practices    -  Avoid sleeper pajamas. Nightgowns allow air to circulate.  Sleep without underpants whenever possible.   -  Wear cotton underpants during the day. Double-rinse underwear after washing to avoid residual irritants. Do not use fabric softeners for underwear and swimsuits.   - Avoid tights, leotards, leggings, "skinny" jeans, and other tight-fitting clothing. Skirts and loose-fitting pants allow air to circulate.   - Avoid pantyliners.  Instead use tampons or cotton pads.   - Use the restroom after intercourse to help prevent UTI's   - Daily warm bathing is helpful:     - Soak in clean water (no soap) for 10 to 15 minutes. Adding vinegar or baking soda to the water has not been specifically studied and may not be better than clean water alone.      - Use soap to wash regions other than the genital area just before getting out of the tub. Limit use of any soap on genital areas. Use fragance-free soaps.     - Rinse the genital area well and gently pat dry.  Don't rub.  Hair dryer to assist with drying can be used only if on cool setting.     - Do not use bubble baths or perfumed soaps.   - Do not use any feminine sprays, douches or powders.  These contain chemicals that will irritate the skin.   - If the genital area is tender or swollen, cool compresses may relieve the discomfort. Unscented wet  wipes can be used instead of toilet paper for wiping.    - Emollients, such as Vaseline, may help protect skin and can be applied to the irritated area.   - Always remember to wipe front-to-back after bowel movements. Pat dry after urination.   - Do not sit in wet swimsuits for long periods of time after swimming    If you have been instructed to have an in-person evaluation today at a local Urgent Care facility, please use the link below. It will take you to a list of all of our available [facility],  including address, phone number and hours of operation. Please do not delay care.  [facility]  If you or a family member do not have a primary care provider, use the link below to schedule a visit and establish care. When you choose a [facility] primary care physician or advanced practice provider, you gain a long-term partner in health. Find a Primary Care Provider  Learn more about 's in-office and virtual care options:  - Get Care Now

## 2023-11-20 NOTE — Progress Notes (Signed)
 Virtual Visit Consent   Robyn Willis, you are scheduled for a virtual visit with a [facility] provider today. Just as with appointments in the office, your consent must be obtained to participate. Your consent will be active for this visit and any virtual visit you may have with one of our providers in the next 365 days. If you have a MyChart account, a copy of this consent can be sent to you electronically.  As this is a virtual visit, video technology does not allow for your provider to perform a traditional examination. This may limit your provider's ability to fully assess your condition. If your provider identifies any concerns that need to be evaluated in person or the need to arrange testing (such as labs, EKG, etc.), we will make arrangements to do so. Although advances in technology are sophisticated, we cannot ensure that it will always work on either your end or our end. If the connection with a video visit is poor, the visit may have to be switched to a telephone visit. With either a video or telephone visit, we are not always able to ensure that we have a secure connection.  By engaging in this virtual visit, you consent to the provision of healthcare and authorize for your insurance to be billed (if applicable) for the services provided during this visit. Depending on your insurance coverage, you may receive a charge related to this service.  I need to obtain your verbal consent now. Are you willing to proceed with your visit today? Robyn Willis has provided verbal consent on 11/20/2023 for a virtual visit (video or telephone). Angelia Kelp, PA-C  Date: 11/20/2023 3:01 PM   Virtual Visit via Video Note   IAngelia Kelp, connected with  Robyn Willis  ([id number], 1991-06-23) on 11/20/23 at  3:00 PM EDT by a video-enabled telemedicine application and verified that I am speaking with the correct person using two identifiers.  Location: Patient: Virtual Visit  Location Patient: Home Provider: Virtual Visit Location Provider: Home Office   I discussed the limitations of evaluation and management by telemedicine and the availability of in person appointments. The patient expressed understanding and agreed to proceed.    History of Present Illness: Robyn Willis is a 33 y.o. who identifies as a female who was assigned female at birth, and is being seen today for vaginal discharge and odor.  HPI: Vaginal Discharge The patient's primary symptoms include a genital odor and vaginal discharge. This is a recurrent problem. The current episode started in the past 7 days. The problem occurs constantly. The problem has been gradually worsening. The patient is experiencing no pain. Associated symptoms include abdominal pain. Pertinent negatives include no back pain, chills, dysuria, fever, flank pain, headaches, nausea or vomiting. The vaginal discharge was white, milky and malodorous. The vaginal bleeding is spotting. She has not been passing clots. She has not been passing tissue. Nothing aggravates the symptoms. She has tried nothing for the symptoms. The treatment provided no relief. She is not sexually active.     Problems:  Patient Active Problem List   Diagnosis Date Noted   SVD (spontaneous vaginal delivery) 02/15/2017   Normal labor 02/14/2017   Chlamydia infection complicating pregnancy, third trimester 02/07/2017   IUGR (intrauterine growth restriction) affecting care of mother Low AC <10%ile 01/08/2017   Short cervix, antepartum 10/08/2016   Hives 09/10/2016   Marijuana use 08/15/2016   Depression 08/13/2016   Supervision of high risk pregnancy,  antepartum 01/02/2016    Allergies:  Allergies  Allergen Reactions   Penicillins Hives    Has patient had a PCN reaction causing immediate rash, facial/tongue/throat swelling, SOB or lightheadedness with hypotension: No Has patient had a PCN reaction causing severe rash involving mucus membranes or  skin necrosis: No Has patient had a PCN reaction that required hospitalization No Has patient had a PCN reaction occurring within the last 10 years: No If all of the above answers are "NO", then may proceed with Cephalosporin  Has patient had a PCN reaction causing immediate rash, facial/tongue/throat swelling, SOB or lightheadedness with hypotension: No Has patient had a PCN reaction causing severe rash involving mucus membranes or skin necrosis: No Has patient had a PCN reaction that required hospitalization No Has patient had a PCN reaction occurring within the last 10 years: No If all of the above answers are "NO", then may proceed with Cephalosporin    Ciprofloxacin Nausea And Vomiting   Metronidazole  Hives    Patient states she does fine with the gel   Medications:  Current Outpatient Medications:    clindamycin  (CLEOCIN ) 300 MG capsule, Take 1 capsule (300 mg total) by mouth 2 (two) times daily for 7 days., Disp: 14 capsule, Rfl: 0   ibuprofen  (ADVIL ) 600 MG tablet, Take 1 tablet (600 mg total) by mouth every 6 (six) hours as needed., Disp: 30 tablet, Rfl: 0   norelgestromin -ethinyl estradiol  (ORTHO EVRA) 150-35 MCG/24HR transdermal patch, Place 1 patch onto the skin once a week., Disp: 12 patch, Rfl: 4  Observations/Objective: Patient is well-developed, well-nourished in no acute distress.  Resting comfortably at home.  Head is normocephalic, atraumatic.  No labored breathing.  Speech is clear and coherent with logical content.  Patient is alert and oriented at baseline.    Assessment and Plan: 1. BV (bacterial vaginosis) (Primary) - clindamycin  (CLEOCIN ) 300 MG capsule; Take 1 capsule (300 mg total) by mouth 2 (two) times daily for 7 days.  Dispense: 14 capsule; Refill: 0  - Symptoms consistent with BV - Clindamycin  prescribed - Limit bubble baths, scented lotions/soaps/detergents - Limit tight fitting clothing - Seek on person evaluation if not improving or if symptoms  worsen   Follow Up Instructions: I discussed the assessment and treatment plan with the patient. The patient was provided an opportunity to ask questions and all were answered. The patient agreed with the plan and demonstrated an understanding of the instructions.  A copy of instructions were sent to the patient via MyChart unless otherwise noted below.    The patient was advised to call back or seek an in-person evaluation if the symptoms worsen or if the condition fails to improve as anticipated.    Angelia Kelp, PA-C

## 2023-12-28 ENCOUNTER — Telehealth: Admitting: Nurse Practitioner

## 2024-01-01 DIAGNOSIS — Z113 Encounter for screening for infections with a predominantly sexual mode of transmission: Secondary | ICD-10-CM | POA: Diagnosis not present

## 2024-02-15 ENCOUNTER — Telehealth: Admitting: Physician Assistant

## 2024-02-15 DIAGNOSIS — K6289 Other specified diseases of anus and rectum: Secondary | ICD-10-CM

## 2024-02-15 NOTE — Progress Notes (Signed)
 Virtual Visit Consent   Robyn Willis, you are scheduled for a virtual visit with a [facility] provider today. Just as with appointments in the office, your consent must be obtained to participate. Your consent will be active for this visit and any virtual visit you may have with one of our providers in the next 365 days. If you have a MyChart account, a copy of this consent can be sent to you electronically.  As this is a virtual visit, video technology does not allow for your provider to perform a traditional examination. This may limit your provider's ability to fully assess your condition. If your provider identifies any concerns that need to be evaluated in person or the need to arrange testing (such as labs, EKG, etc.), we will make arrangements to do so. Although advances in technology are sophisticated, we cannot ensure that it will always work on either your end or our end. If the connection with a video visit is poor, the visit may have to be switched to a telephone visit. With either a video or telephone visit, we are not always able to ensure that we have a secure connection.  By engaging in this virtual visit, you consent to the provision of healthcare and authorize for your insurance to be billed (if applicable) for the services provided during this visit. Depending on your insurance coverage, you may receive a charge related to this service.  I need to obtain your verbal consent now. Are you willing to proceed with your visit today? Robyn Willis has provided verbal consent on 02/15/2024 for a virtual visit (video or telephone). Robyn Willis, [place]  Date: 02/15/2024 3:10 PM   Virtual Visit via Video Note   I, Robyn Willis, connected with  Robyn Willis  ([id number], [birth year]) on 02/15/24 at  3:00 PM EDT by a video-enabled telemedicine application and verified that I am speaking with the correct person using two identifiers.  Location: Patient: Virtual Visit  Location Patient: Home Provider: Virtual Visit Location Provider: Home Office   I discussed the limitations of evaluation and management by telemedicine and the availability of in person appointments. The patient expressed understanding and agreed to proceed.    History of Present Illness: Robyn Willis is a 33 y.o. who identifies as a female who was assigned female at birth, and is being seen today for pain in rectum/anus over the past couple of days. Initially wokse up 2 days ago and felt some mild discomfort. Has worsened since then. Hard to sit due to pain. Is trying to stand or lay on her side as much as possible. Denies any rectal bleeding. Does note some constipation this past week. Denies pain starting with bowel movement. Denies any palpable lesion of the anus. Denies fever, chills.   HPI: HPI  Problems:  Patient Active Problem List   Diagnosis Date Noted   SVD (spontaneous vaginal delivery) 02/15/2017   Normal labor 02/14/2017   Chlamydia infection complicating pregnancy, third trimester 02/07/2017   IUGR (intrauterine growth restriction) affecting care of mother Low AC <10%ile 01/08/2017   Short cervix, antepartum 10/08/2016   Hives 09/10/2016   Marijuana use 08/15/2016   Depression 08/13/2016   Supervision of high risk pregnancy, antepartum 01/02/2016    Allergies:  Allergies  Allergen Reactions   Penicillins Hives    Has patient had a PCN reaction causing immediate rash, facial/tongue/throat swelling, SOB or lightheadedness with hypotension: No Has patient had a PCN reaction causing severe rash  involving mucus membranes or skin necrosis: No Has patient had a PCN reaction that required hospitalization No Has patient had a PCN reaction occurring within the last 10 years: No If all of the above answers are NO, then may proceed with Cephalosporin  Has patient had a PCN reaction causing immediate rash, facial/tongue/throat swelling, SOB or lightheadedness with hypotension:  No Has patient had a PCN reaction causing severe rash involving mucus membranes or skin necrosis: No Has patient had a PCN reaction that required hospitalization No Has patient had a PCN reaction occurring within the last 10 years: No If all of the above answers are NO, then may proceed with Cephalosporin    Ciprofloxacin Nausea And Vomiting   Metronidazole  Hives    Patient states she does fine with the gel   Medications:  Current Outpatient Medications:    ibuprofen  (ADVIL ) 600 MG tablet, Take 1 tablet (600 mg total) by mouth every 6 (six) hours as needed., Disp: 30 tablet, Rfl: 0   norelgestromin -ethinyl estradiol  (ORTHO EVRA) 150-35 MCG/24HR transdermal patch, Place 1 patch onto the skin once a week., Disp: 12 patch, Rfl: 4  Observations/Objective: Patient is well-developed, well-nourished in no acute distress.  Resting comfortably  at home.  Head is normocephalic, atraumatic.  No labored breathing.  Speech is clear and coherent with logical content.  Patient is alert and oriented at baseline.   Assessment and Plan: 1. Rectal pain (Primary)  Worsening since onset. Discussed thrombosed hemorrhoid versus fissure versus abscess. Needs examination so proper treatment can be given. She agrees to be evaluated at in person location today.  Follow Up Instructions: I discussed the assessment and treatment plan with the patient. The patient was provided an opportunity to ask questions and all were answered. The patient agreed with the plan and demonstrated an understanding of the instructions.  A copy of instructions were sent to the patient via MyChart unless otherwise noted below.    The patient was advised to call back or seek an in-person evaluation if the symptoms worsen or if the condition fails to improve as anticipated.    Robyn Velma Lunger, PA-C

## 2024-02-15 NOTE — Patient Instructions (Signed)
  Zainah D Pomplun, thank you for joining [name], PA-C for today's virtual visit.  While this provider is not your primary care provider (PCP), if your PCP is located in our provider database this encounter information will be shared with them immediately following your visit.   A [facility] MyChart account gives you access to today's visit and all your visits, tests, and labs performed at [facility]  click here if you don't have a [facility] MyChart account or go to [email or web address]  Consent: (Patient) Robyn Willis provided verbal consent for this virtual visit at the beginning of the encounter.  Current Medications:  Current Outpatient Medications:    ibuprofen  (ADVIL ) 600 MG tablet, Take 1 tablet (600 mg total) by mouth every 6 (six) hours as needed., Disp: 30 tablet, Rfl: 0   norelgestromin -ethinyl estradiol  (ORTHO EVRA) 150-35 MCG/24HR transdermal patch, Place 1 patch onto the skin once a week., Disp: 12 patch, Rfl: 4   Medications ordered in this encounter:  No orders of the defined types were placed in this encounter.    *If you need refills on other medications prior to your next appointment, please contact your pharmacy*  Follow-Up: Call back or seek an in-person evaluation if the symptoms worsen or if the condition fails to improve as anticipated.  [facility] [phone]  Other Instructions  If you have been instructed to have an in-person evaluation today at a local Urgent Care facility, please use the link below. It will take you to a list of all of our available [facility], including address, phone number and hours of operation. Please do not delay care.  [facility]  If you or a family member do not have a primary care provider, use the link below to schedule a visit and establish care. When you choose a [facility]  primary care physician or advanced practice provider, you gain a long-term  partner in health. Find a Primary Care Provider  Learn more about [facility]'s in-office and virtual care options: [facility] - Get Care Now

## 2024-03-11 ENCOUNTER — Ambulatory Visit

## 2024-03-11 NOTE — Progress Notes (Deleted)
 New patient visit   Patient: Robyn Willis   DOB: [birth year]   33 y.o. Female  MRN: [id number] Visit Date: 03/11/2024  Today's healthcare provider: Isaiah DELENA Pepper, MD   No chief complaint on file.  Subjective    Robyn Willis is a 33 y.o. female who presents today as a new patient to establish care.   Discussed the use of AI scribe software for clinical note transcription with the patient, who gave verbal consent to proceed.  History of Present Illness             Past Medical History:  Diagnosis Date   Anxiety    self reported   Medical history non-contributory    Past Surgical History:  Procedure Laterality Date   NO PAST SURGERIES     Family Status  Relation Name Status   Mother  Alive   Father  Alive   Sister  Alive   Brother  Alive   Daughter  Alive   MGM  Deceased   MGF  Deceased   PGM  Deceased   PGF  Deceased   Sister  Alive   Sister  Alive   Sister  Alive  No partnership data on file   Family History  Problem Relation Age of Onset   Kidney disease Mother    Diabetes Mother    Hypertension Mother    Diabetes Father    Hypertension Father    Heart disease Maternal Grandmother    Alzheimer's disease Paternal Grandmother    Social History   Socioeconomic History   Marital status: Single    Spouse name: Not on file   Number of children: Not on file   Years of education: Not on file   Highest education level: Not on file  Occupational History   Not on file  Tobacco Use   Smoking status: Every Day    Current packs/day: 0.00    Types: Cigarettes, Cigars    Last attempt to quit: 07/16/2016    Years since quitting: 7.6   Smokeless tobacco: Never  Vaping Use   Vaping status: Former  Substance and Sexual Activity   Alcohol use: No   Drug use: Not Currently    Types: Marijuana    Comment: a couple weeks ago   Sexual activity: Yes    Birth control/protection: None    Comment: a couple weeks ago  Other Topics Concern   Not on  file  Social History Narrative   Not on file   Social Drivers of Health   Financial Resource Strain: Low Risk  (05/09/2021)   Overall Financial Resource Strain (CARDIA)    Difficulty of Paying Living Expenses: Not hard at all  Food Insecurity: No Food Insecurity (05/09/2021)   Hunger Vital Sign    Worried About Running Out of Food in the Last Year: Never true    Ran Out of Food in the Last Year: Never true  Transportation Needs: No Transportation Needs (05/09/2021)   PRAPARE - Administrator, Civil Service (Medical): No    Lack of Transportation (Non-Medical): No  Physical Activity: Sufficiently Active (05/09/2021)   Exercise Vital Sign    Days of Exercise per Week: 3 days    Minutes of Exercise per Session: 60 min  Stress: Stress Concern Present (05/09/2021)   [employer] of Occupational Health - Occupational Stress Questionnaire    Feeling of Stress : Very much  Social Connections: Socially Isolated (05/09/2021)   Social  Connection and Isolation Panel    Frequency of Communication with Friends and Family: More than three times a week    Frequency of Social Gatherings with Friends and Family: Twice a week    Attends Religious Services: Never    Database administrator or Organizations: No    Attends Banker Meetings: Never    Marital Status: Never married   Outpatient Medications Prior to Visit  Medication Sig   ibuprofen  (ADVIL ) 600 MG tablet Take 1 tablet (600 mg total) by mouth every 6 (six) hours as needed.   norelgestromin -ethinyl estradiol  (ORTHO EVRA) 150-35 MCG/24HR transdermal patch Place 1 patch onto the skin once a week.   No facility-administered medications prior to visit.   Allergies  Allergen Reactions   Penicillins Hives    Has patient had a PCN reaction causing immediate rash, facial/tongue/throat swelling, SOB or lightheadedness with hypotension: No Has patient had a PCN reaction causing severe rash involving mucus membranes  or skin necrosis: No Has patient had a PCN reaction that required hospitalization No Has patient had a PCN reaction occurring within the last 10 years: No If all of the above answers are NO, then may proceed with Cephalosporin  Has patient had a PCN reaction causing immediate rash, facial/tongue/throat swelling, SOB or lightheadedness with hypotension: No Has patient had a PCN reaction causing severe rash involving mucus membranes or skin necrosis: No Has patient had a PCN reaction that required hospitalization No Has patient had a PCN reaction occurring within the last 10 years: No If all of the above answers are NO, then may proceed with Cephalosporin    Ciprofloxacin Nausea And Vomiting   Metronidazole  Hives    Patient states she does fine with the gel    Reviews of Systems as noted in HPI.  {Insert previous labs (optional):23779} {See past labs  Heme  Chem  Endocrine  Serology  Results Review (optional):1}   Objective    There were no vitals taken for this visit. {Insert last BP/Wt (optional):23777}{See vitals history (optional):1}   Physical Exam  Depression Screen    05/09/2021    1:36 PM 08/13/2016    3:26 PM  PHQ 2/9 Scores  PHQ - 2 Score 4 5  PHQ- 9 Score 17 13   No results found for any visits on 03/11/24.  Assessment & Plan      Problem List Items Addressed This Visit   None   Assessment and Plan              No follow-ups on file.      Isaiah DELENA Pepper, MD  [facility] [phone] (phone) [phone] (fax)

## 2024-04-07 ENCOUNTER — Telehealth: Admitting: Physician Assistant

## 2024-04-07 DIAGNOSIS — B3731 Acute candidiasis of vulva and vagina: Secondary | ICD-10-CM | POA: Diagnosis not present

## 2024-04-07 DIAGNOSIS — B370 Candidal stomatitis: Secondary | ICD-10-CM | POA: Diagnosis not present

## 2024-04-07 DIAGNOSIS — N760 Acute vaginitis: Secondary | ICD-10-CM

## 2024-04-07 DIAGNOSIS — B9689 Other specified bacterial agents as the cause of diseases classified elsewhere: Secondary | ICD-10-CM | POA: Diagnosis not present

## 2024-04-07 MED ORDER — NYSTATIN 100000 UNIT/ML MT SUSP
5.0000 mL | Freq: Four times a day (QID) | OROMUCOSAL | 0 refills | Status: DC
Start: 2024-04-07 — End: 2024-05-27

## 2024-04-07 MED ORDER — FLUCONAZOLE 150 MG PO TABS: 150.0000 mg | ORAL_TABLET | ORAL | 0 refills | Status: DC | PRN

## 2024-04-07 MED ORDER — CLINDAMYCIN HCL 300 MG PO CAPS: 300.0000 mg | ORAL_CAPSULE | Freq: Two times a day (BID) | ORAL | 0 refills | Status: AC

## 2024-04-07 NOTE — Patient Instructions (Signed)
 Christa D Koch, thank you for joining Delon CHRISTELLA Dickinson, PA-C for today's virtual visit.  While this provider is not your primary care provider (PCP), if your PCP is located in our provider database this encounter information will be shared with them immediately following your visit.   A [facility] MyChart account gives you access to today's visit and all your visits, tests, and labs performed at [facility]  click here if you don't have a [facility] MyChart account or go to [email or web address]  Consent: (Patient) Robyn Willis provided verbal consent for this virtual visit at the beginning of the encounter.  Current Medications:  Current Outpatient Medications:    clindamycin  (CLEOCIN ) 300 MG capsule, Take 1 capsule (300 mg total) by mouth in the morning and at bedtime for 7 days., Disp: 14 capsule, Rfl: 0   fluconazole  (DIFLUCAN ) 150 MG tablet, Take 1 tablet (150 mg total) by mouth every 3 (three) days as needed., Disp: 2 tablet, Rfl: 0   nystatin  (MYCOSTATIN ) 100000 UNIT/ML suspension, Take 5 mLs (500,000 Units total) by mouth 4 (four) times daily., Disp: 120 mL, Rfl: 0   ibuprofen  (ADVIL ) 600 MG tablet, Take 1 tablet (600 mg total) by mouth every 6 (six) hours as needed., Disp: 30 tablet, Rfl: 0   norelgestromin -ethinyl estradiol  (ORTHO EVRA) 150-35 MCG/24HR transdermal patch, Place 1 patch onto the skin once a week., Disp: 12 patch, Rfl: 4   Medications ordered in this encounter:  Meds ordered this encounter  Medications   clindamycin  (CLEOCIN ) 300 MG capsule    Sig: Take 1 capsule (300 mg total) by mouth in the morning and at bedtime for 7 days.    Dispense:  14 capsule    Refill:  0    Supervising Provider:   BLAISE ALEENE KIDD [[id number]]   fluconazole  (DIFLUCAN ) 150 MG tablet    Sig: Take 1 tablet (150 mg total) by mouth every 3 (three) days as needed.    Dispense:  2 tablet    Refill:  0    Supervising Provider:   LAMPTEY, PHILIP O [[id number]]   nystatin   (MYCOSTATIN ) 100000 UNIT/ML suspension    Sig: Take 5 mLs (500,000 Units total) by mouth 4 (four) times daily.    Dispense:  120 mL    Refill:  0    Supervising Provider:   BLAISE ALEENE KIDD [[id number]]     *If you need refills on other medications prior to your next appointment, please contact your pharmacy*  Follow-Up: Call back or seek an in-person evaluation if the symptoms worsen or if the condition fails to improve as anticipated.  [facility] [phone]  Other Instructions Vaginal Probiotics: AZO vaginal probiotic OLLY Happy Hoo-Ha RAW Vaginal Care RenewLife Women's vaginal probiotic RepHresh Pro-B  Vaginal washes: Honey Pot Summer's Eve Vagisil Feminine cleanser  Boric Acid Suppositories  Healthy vaginal hygiene practices    -  Avoid sleeper pajamas. Nightgowns allow air to circulate.  Sleep without underpants whenever possible.   -  Wear cotton underpants during the day. Double-rinse underwear after washing to avoid residual irritants. Do not use fabric softeners for underwear and swimsuits.   - Avoid tights, leotards, leggings, skinny jeans, and other tight-fitting clothing. Skirts and loose-fitting pants allow air to circulate.   - Avoid pantyliners.  Instead use tampons or cotton pads.   - Use the restroom after intercourse to help prevent UTI's   - Daily warm bathing is helpful:     - Soak in  clean water (no soap) for 10 to 15 minutes. Adding vinegar or baking soda to the water has not been specifically studied and may not be better than clean water alone.      - Use soap to wash regions other than the genital area just before getting out of the tub. Limit use of any soap on genital areas. Use fragance-free soaps.     - Rinse the genital area well and gently pat dry.  Don't rub.  Hair dryer to assist with drying can be used only if on cool setting.     - Do not use bubble baths or perfumed soaps.   - Do not use any feminine sprays, douches  or powders.  These contain chemicals that will irritate the skin.   - If the genital area is tender or swollen, cool compresses may relieve the discomfort. Unscented wet wipes can be used instead of toilet paper for wiping.    - Emollients, such as Vaseline, may help protect skin and can be applied to the irritated area.   - Always remember to wipe front-to-back after bowel movements. Pat dry after urination.   - Do not sit in wet swimsuits for long periods of time after swimming    If you have been instructed to have an in-person evaluation today at a local Urgent Care facility, please use the link below. It will take you to a list of all of our available [facility], including address, phone number and hours of operation. Please do not delay care.  [facility]  If you or a family member do not have a primary care provider, use the link below to schedule a visit and establish care. When you choose a [facility] primary care physician or advanced practice provider, you gain a long-term partner in health. Find a Primary Care Provider  Learn more about [facility]'s in-office and virtual care options: [facility] - Get Care Now

## 2024-04-07 NOTE — Progress Notes (Signed)
 Virtual Visit Consent   Robyn Willis, you are scheduled for a virtual visit with a [facility] provider today. Just as with appointments in the office, your consent must be obtained to participate. Your consent will be active for this visit and any virtual visit you may have with one of our providers in the next 365 days. If you have a MyChart account, a copy of this consent can be sent to you electronically.  As this is a virtual visit, video technology does not allow for your provider to perform a traditional examination. This may limit your provider's ability to fully assess your condition. If your provider identifies any concerns that need to be evaluated in person or the need to arrange testing (such as labs, EKG, etc.), we will make arrangements to do so. Although advances in technology are sophisticated, we cannot ensure that it will always work on either your end or our end. If the connection with a video visit is poor, the visit may have to be switched to a telephone visit. With either a video or telephone visit, we are not always able to ensure that we have a secure connection.  By engaging in this virtual visit, you consent to the provision of healthcare and authorize for your insurance to be billed (if applicable) for the services provided during this visit. Depending on your insurance coverage, you may receive a charge related to this service.  I need to obtain your verbal consent now. Are you willing to proceed with your visit today? Robyn Willis has provided verbal consent on 04/07/2024 for a virtual visit (video or telephone). Delon CHRISTELLA Dickinson, PA-C  Date: 04/07/2024 8:45 AM   Virtual Visit via Video Note   I, Delon CHRISTELLA Dickinson, connected with  Robyn Willis  ([id number], [birth year]) on 04/07/24 at  8:30 AM EDT by a video-enabled telemedicine application and verified that I am speaking with the correct person using two identifiers.  Location: Patient: Virtual Visit  Location Patient: Home Provider: Virtual Visit Location Provider: Home Office   I discussed the limitations of evaluation and management by telemedicine and the availability of in person appointments. The patient expressed understanding and agreed to proceed.    History of Present Illness: Robyn Willis is a 33 y.o. who identifies as a female who was assigned female at birth, and is being seen today for recurrent BV.  HPI: Vaginal Discharge The patient's primary symptoms include a genital odor and vaginal discharge. This is a recurrent problem. The current episode started in the past 7 days (started after she got her birth control shot). The problem occurs constantly. The problem has been unchanged. The patient is experiencing no pain. She is not pregnant. Pertinent negatives include no abdominal pain, back pain, constipation, diarrhea, fever, frequency, hematuria, nausea, painful intercourse or vomiting. Associated symptoms comments: Fatigue, hair loss, white coating on tongue. The vaginal discharge was white, milky and malodorous. The vaginal bleeding is spotting. She has not been passing clots. She has not been passing tissue. Nothing aggravates the symptoms. She has tried nothing for the symptoms. The treatment provided no relief.    Problems:  Patient Active Problem List   Diagnosis Date Noted   SVD (spontaneous vaginal delivery) 02/15/2017   Normal labor 02/14/2017   Chlamydia infection complicating pregnancy, third trimester 02/07/2017   IUGR (intrauterine growth restriction) affecting care of mother Low AC <10%ile 01/08/2017   Short cervix, antepartum 10/08/2016   Hives 09/10/2016   Marijuana use 08/15/2016  Depression 08/13/2016   Supervision of high risk pregnancy, antepartum 01/02/2016    Allergies:  Allergies  Allergen Reactions   Penicillins Hives    Has patient had a PCN reaction causing immediate rash, facial/tongue/throat swelling, SOB or lightheadedness with  hypotension: No Has patient had a PCN reaction causing severe rash involving mucus membranes or skin necrosis: No Has patient had a PCN reaction that required hospitalization No Has patient had a PCN reaction occurring within the last 10 years: No If all of the above answers are NO, then may proceed with Cephalosporin  Has patient had a PCN reaction causing immediate rash, facial/tongue/throat swelling, SOB or lightheadedness with hypotension: No Has patient had a PCN reaction causing severe rash involving mucus membranes or skin necrosis: No Has patient had a PCN reaction that required hospitalization No Has patient had a PCN reaction occurring within the last 10 years: No If all of the above answers are NO, then may proceed with Cephalosporin    Ciprofloxacin Nausea And Vomiting   Metronidazole  Hives    Patient states she does fine with the gel   Medications:  Current Outpatient Medications:    clindamycin  (CLEOCIN ) 300 MG capsule, Take 1 capsule (300 mg total) by mouth in the morning and at bedtime for 7 days., Disp: 14 capsule, Rfl: 0   fluconazole  (DIFLUCAN ) 150 MG tablet, Take 1 tablet (150 mg total) by mouth every 3 (three) days as needed., Disp: 2 tablet, Rfl: 0   nystatin  (MYCOSTATIN ) 100000 UNIT/ML suspension, Take 5 mLs (500,000 Units total) by mouth 4 (four) times daily., Disp: 120 mL, Rfl: 0   ibuprofen  (ADVIL ) 600 MG tablet, Take 1 tablet (600 mg total) by mouth every 6 (six) hours as needed., Disp: 30 tablet, Rfl: 0   norelgestromin -ethinyl estradiol  (ORTHO EVRA) 150-35 MCG/24HR transdermal patch, Place 1 patch onto the skin once a week., Disp: 12 patch, Rfl: 4  Observations/Objective: Patient is well-developed, well-nourished in no acute distress.  Resting comfortably at home.  Head is normocephalic, atraumatic.  No labored breathing.  Speech is clear and coherent with logical content.  Patient is alert and oriented at baseline.    Assessment and Plan: 1. Thrush  (Primary) - fluconazole  (DIFLUCAN ) 150 MG tablet; Take 1 tablet (150 mg total) by mouth every 3 (three) days as needed.  Dispense: 2 tablet; Refill: 0 - nystatin  (MYCOSTATIN ) 100000 UNIT/ML suspension; Take 5 mLs (500,000 Units total) by mouth 4 (four) times daily.  Dispense: 120 mL; Refill: 0  2. Vaginal yeast infection - fluconazole  (DIFLUCAN ) 150 MG tablet; Take 1 tablet (150 mg total) by mouth every 3 (three) days as needed.  Dispense: 2 tablet; Refill: 0  3. BV (bacterial vaginosis) - clindamycin  (CLEOCIN ) 300 MG capsule; Take 1 capsule (300 mg total) by mouth in the morning and at bedtime for 7 days.  Dispense: 14 capsule; Refill: 0  - Symptoms consistent with BV, vaginal yeast infection and thrush - Clindamycin  for BV, Fluconazole  and Nystatin  susp for vaginal yeast and thrush prescribed - Limit bubble baths, scented lotions/soaps/detergents - Limit tight fitting clothing - Follow up in person for bloodwork for fatigue and hair loss - Seek on person evaluation if not improving or if symptoms worsen   Follow Up Instructions: I discussed the assessment and treatment plan with the patient. The patient was provided an opportunity to ask questions and all were answered. The patient agreed with the plan and demonstrated an understanding of the instructions.  A copy of instructions were sent  to the patient via MyChart unless otherwise noted below.    The patient was advised to call back or seek an in-person evaluation if the symptoms worsen or if the condition fails to improve as anticipated.    Delon CHRISTELLA Dickinson, PA-C

## 2024-04-27 ENCOUNTER — Ambulatory Visit: Payer: Self-pay

## 2024-04-27 NOTE — Telephone Encounter (Signed)
 FYI Only or Action Required?: Action required by provider: request for appointment.  Patient was last seen in primary care on 04/07/2024 by Vivienne Delon HERO, PA-C.  Called Nurse Triage reporting Abdominal Pain.  Symptoms began several days ago.  Interventions attempted: Nothing.  Symptoms are: unchanged.  Triage Disposition: See HCP Within 4 Hours (Or PCP Triage)  Patient/caregiver understands and will follow disposition?: Yes  Copied from CRM [id number]. Topic: Clinical - Red Word Triage >> Apr 27, 2024  1:38 PM Fonda T wrote: [facility] that prompted transfer to Nurse Triage: Patient calling, reports she is having increased nausea, abdominal cramps, and diarrhea.   Patient reports just recently had miscarriage about 1 week ago, and has since been having these symptoms, but are worsening. Reason for Disposition  [1] Constant abdominal pain AND [2] present > 2 hours  Answer Assessment - Initial Assessment Questions Advised UC/ED today. Advised patient to also contact OBGYN, regarding the medical abortion/miscarriage medication.  Patient reports will go to Urgent Care.  Scheduled new pt appt, 05/04/24.  1. LOCATION: Where does it hurt?     Upper abdominal pain, above belly button; n/v Vomit-not today Nausea currently Last BM today; diarrhea, 4x in 24 hours, loose watery stool/brown Eating hurts stomach, makes diarrhea. Been drinking fine. Abdomen feels fine, denies hard or distented 2. RADIATION: Does the pain shoot anywhere else? (e.g., chest, back)     denies 3. ONSET: When did the pain begin? (Minutes, hours or days ago)      Couple weeks ago; diarrhea and nausea, miscarried/ no heart beat, took medication for medical abortion; Friday, vaginal u/s  5. ONSET: Gradual or sudden onset?     gradual 6. PATTERN Does the pain come and go, or has it been constant since it started?  (Note: Most serious pain is constant and it progresses.)      Comes and goes; haven't  tried anything 7. SEVERITY: How bad is the pain? What does it keep you from doing?      8/10; more so with nausea 8. FEVER: Do you have a fever? If Yes, ask: What is your temperature, how was it measured, and when did it start?     Denies fever, reports having chills 9. VAGINAL BLEEDING: Describe any vaginal bleeding you are having. (e.g., amount; color; odor)     Spotting with wiping, scant amount, dark and bright blood; not a lot, only there when wiping.Denies discharge, odor 10. OTHER SYMPTOMS: Do you have any other symptoms? (e.g., back pain, diarrhea, fever, urination pain, vaginal discharge, vomiting; feeling anxious, depressed, overwhelmed, or sad) Denies feeling faint. Reports weakness; still able to work and take care of self  Protocols used: Postpartum - Abdominal Pain-A-AH

## 2024-04-27 NOTE — Telephone Encounter (Signed)
 Called patient back.

## 2024-04-29 ENCOUNTER — Telehealth: Admitting: Physician Assistant

## 2024-04-29 ENCOUNTER — Encounter: Admitting: Physician Assistant

## 2024-04-29 DIAGNOSIS — N760 Acute vaginitis: Secondary | ICD-10-CM

## 2024-04-29 DIAGNOSIS — Z91148 Patient's other noncompliance with medication regimen for other reason: Secondary | ICD-10-CM | POA: Diagnosis not present

## 2024-04-29 DIAGNOSIS — B9689 Other specified bacterial agents as the cause of diseases classified elsewhere: Secondary | ICD-10-CM | POA: Diagnosis not present

## 2024-04-29 MED ORDER — CLINDAMYCIN PHOSPHATE 2 % VA CREA: 1.0000 | TOPICAL_CREAM | Freq: Every day | VAGINAL | 0 refills | Status: DC

## 2024-04-29 NOTE — Progress Notes (Signed)
 Patient seen by e-visit. This is duplicate. Will mark as erroneous encounter now that verified patient did not need anything else.

## 2024-04-29 NOTE — Progress Notes (Signed)
 We are sorry that you are not feeling well. Here is how we plan to help! Based on what you shared with me it looks like you: May have a vaginosis due to bacteria  Vaginosis is an inflammation of the vagina that can result in discharge, itching and pain. The cause is usually a change in the normal balance of vaginal bacteria or an infection. Vaginosis can also result from reduced estrogen levels after menopause.  The most common causes of vaginosis are:   Bacterial vaginosis which results from an overgrowth of one on several organisms that are normally present in your vagina.   Yeast infections which are caused by a naturally occurring fungus called candida.   Vaginal atrophy (atrophic vaginosis) which results from the thinning of the vagina from reduced estrogen levels after menopause.   Trichomoniasis which is caused by a parasite and is commonly transmitted by sexual intercourse.  Factors that increase your risk of developing vaginosis include: Medications, such as antibiotics and steroids Uncontrolled diabetes Use of hygiene products such as bubble bath, vaginal spray or vaginal deodorant Douching Wearing damp or tight-fitting clothing Using an intrauterine device (IUD) for birth control Hormonal changes, such as those associated with pregnancy, birth control pills or menopause Sexual activity Having a sexually transmitted infection  Your treatment plan is Clindamycin  vaginal cream 5 grams applied vaginally for 7 days.  I have electronically sent this prescription into the pharmacy that you have chosen.  Be sure to take all of the medication as directed. Stop taking any medication if you develop a rash, tongue swelling or shortness of breath. Mothers who are breast feeding should consider pumping and discarding their breast milk while on these antibiotics. However, there is no consensus that infant exposure at these doses would be harmful.  Remember that medication creams can weaken  latex condoms.   HOME CARE:  Good hygiene may prevent some types of vaginosis from recurring and may relieve some symptoms:  Avoid baths, hot tubs and whirlpool spas. Rinse soap from your outer genital area after a shower, and dry the area well to prevent irritation. Don't use scented or harsh soaps, such as those with deodorant or antibacterial action. Avoid irritants. These include scented tampons and pads. Wipe from front to back after using the toilet. Doing so avoids spreading fecal bacteria to your vagina.  Other things that may help prevent vaginosis include:  Don't douche. Your vagina doesn't require cleansing other than normal bathing. Repetitive douching disrupts the normal organisms that reside in the vagina and can actually increase your risk of vaginal infection. Douching won't clear up a vaginal infection. Use a latex condom. Both female and female latex condoms may help you avoid infections spread by sexual contact. Wear cotton underwear. Also wear pantyhose with a cotton crotch. If you feel comfortable without it, skip wearing underwear to bed. Yeast thrives in [employer] Your symptoms should improve in the next day or two.  GET HELP RIGHT AWAY IF:  You have pain in your lower abdomen ( pelvic area or over your ovaries) You develop nausea or vomiting You develop a fever Your discharge changes or worsens You have persistent pain with intercourse You develop shortness of breath, a rapid pulse, or you faint.  These symptoms could be signs of problems or infections that need to be evaluated by a medical provider now.  MAKE SURE YOU   Understand these instructions. Will watch your condition. Will get help right away if you are not doing  well or get worse.  Your e-visit answers were reviewed by a board certified advanced clinical practitioner to complete your personal care plan. Depending upon the condition, your plan could have included both over the counter or  prescription medications. Please review your pharmacy choice to make sure that you have choses a pharmacy that is open for you to pick up any needed prescription, Your safety is important to us . If you have drug allergies check your prescription carefully.   You can use MyChart to ask questions about today's visit, request a non-urgent call back, or ask for a work or school excuse for 24 hours related to this e-Visit. If it has been greater than 24 hours you will need to follow up with your provider, or enter a new e-Visit to address those concerns. You will get a MyChart message within the next two days asking about your experience. I hope that your e-visit has been valuable and will speed your recovery.  I have spent 5 minutes in review of e-visit questionnaire, review and updating patient chart, medical decision making and response to patient.   [name], PA-C

## 2024-05-05 ENCOUNTER — Other Ambulatory Visit: Payer: Self-pay

## 2024-05-06 ENCOUNTER — Ambulatory Visit (HOSPITAL_BASED_OUTPATIENT_CLINIC_OR_DEPARTMENT_OTHER): Admitting: Student

## 2024-05-06 ENCOUNTER — Ambulatory Visit: Admitting: Family Medicine

## 2024-05-17 ENCOUNTER — Ambulatory Visit: Admitting: Family Medicine

## 2024-05-24 DIAGNOSIS — N939 Abnormal uterine and vaginal bleeding, unspecified: Secondary | ICD-10-CM | POA: Diagnosis not present

## 2024-05-27 ENCOUNTER — Ambulatory Visit: Admitting: Family Medicine

## 2024-05-27 ENCOUNTER — Encounter: Payer: Self-pay | Admitting: Family Medicine

## 2024-05-27 VITALS — BP 165/105 | HR 95 | Resp 16 | Ht 64.0 in | Wt 121.8 lb

## 2024-05-27 DIAGNOSIS — Z0000 Encounter for general adult medical examination without abnormal findings: Secondary | ICD-10-CM

## 2024-05-27 DIAGNOSIS — R030 Elevated blood-pressure reading, without diagnosis of hypertension: Secondary | ICD-10-CM

## 2024-05-27 DIAGNOSIS — Z7689 Persons encountering health services in other specified circumstances: Secondary | ICD-10-CM

## 2024-05-27 DIAGNOSIS — F330 Major depressive disorder, recurrent, mild: Secondary | ICD-10-CM

## 2024-05-27 DIAGNOSIS — N920 Excessive and frequent menstruation with regular cycle: Secondary | ICD-10-CM

## 2024-05-27 DIAGNOSIS — Z111 Encounter for screening for respiratory tuberculosis: Secondary | ICD-10-CM | POA: Diagnosis not present

## 2024-05-27 DIAGNOSIS — F411 Generalized anxiety disorder: Secondary | ICD-10-CM | POA: Diagnosis not present

## 2024-05-27 LAB — POCT URINE PREGNANCY: Preg Test, Ur: NEGATIVE

## 2024-05-27 MED ORDER — FLUOXETINE HCL 10 MG PO CAPS: 10.0000 mg | ORAL_CAPSULE | Freq: Every day | ORAL | 2 refills | Status: AC

## 2024-05-27 NOTE — Patient Instructions (Signed)
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
## 2024-05-30 LAB — HEMOGLOBIN A1C
Est. average glucose Bld gHb Est-mCnc: 105 mg/dL
Hgb A1c MFr Bld: 5.3 % (ref 4.8–5.6)

## 2024-05-30 LAB — CBC WITH DIFFERENTIAL/PLATELET
Basophils Absolute: 0 x10E3/uL (ref 0.0–0.2)
Basos: 1 %
EOS (ABSOLUTE): 0.1 x10E3/uL (ref 0.0–0.4)
Eos: 1 %
Hematocrit: 40.9 % (ref 34.0–46.6)
Hemoglobin: 13.6 g/dL (ref 11.1–15.9)
Immature Grans (Abs): 0 x10E3/uL (ref 0.0–0.1)
Immature Granulocytes: 0 %
Lymphocytes Absolute: 2.5 x10E3/uL (ref 0.7–3.1)
Lymphs: 32 %
MCH: 32.3 pg (ref 26.6–33.0)
MCHC: 33.3 g/dL (ref 31.5–35.7)
MCV: 97 fL (ref 79–97)
Monocytes Absolute: 0.5 x10E3/uL (ref 0.1–0.9)
Monocytes: 6 %
Neutrophils Absolute: 4.8 x10E3/uL (ref 1.4–7.0)
Neutrophils: 60 %
Platelets: 282 x10E3/uL (ref 150–450)
RBC: 4.21 x10E6/uL (ref 3.77–5.28)
RDW: 12.2 % (ref 11.7–15.4)
WBC: 7.9 x10E3/uL (ref 3.4–10.8)

## 2024-05-30 LAB — COMPREHENSIVE METABOLIC PANEL WITH GFR
ALT: 15 IU/L (ref 0–32)
AST: 16 IU/L (ref 0–40)
Albumin: 4.3 g/dL (ref 3.9–4.9)
Alkaline Phosphatase: 52 IU/L (ref 41–116)
BUN/Creatinine Ratio: 13 (ref 9–23)
BUN: 8 mg/dL (ref 6–20)
Bilirubin Total: 0.4 mg/dL (ref 0.0–1.2)
CO2: 22 mmol/L (ref 20–29)
Calcium: 8.7 mg/dL (ref 8.7–10.2)
Chloride: 102 mmol/L (ref 96–106)
Creatinine, Ser: 0.61 mg/dL (ref 0.57–1.00)
Globulin, Total: 2.5 g/dL (ref 1.5–4.5)
Glucose: 82 mg/dL (ref 70–99)
Potassium: 3.9 mmol/L (ref 3.5–5.2)
Sodium: 138 mmol/L (ref 134–144)
Total Protein: 6.8 g/dL (ref 6.0–8.5)
eGFR: 122 mL/min/1.73 (ref >59)

## 2024-05-30 LAB — QUANTIFERON-TB GOLD PLUS
QuantiFERON Mitogen Value: >10 [IU]/mL
QuantiFERON Nil Value: 0.07 [IU]/mL
QuantiFERON TB1 Ag Value: 0.05 [IU]/mL
QuantiFERON TB2 Ag Value: 0.07 [IU]/mL
QuantiFERON-TB Gold Plus: NEGATIVE

## 2024-05-30 LAB — TSH RFX ON ABNORMAL TO FREE T4: TSH: 1.04 u[IU]/mL (ref 0.450–4.500)

## 2024-05-30 LAB — IRON,TIBC AND FERRITIN PANEL
Ferritin: 25 ng/mL (ref 15–150)
Iron Saturation: 17 % (ref 15–55)
Iron: 52 ug/dL (ref 27–159)
Total Iron Binding Capacity: 298 ug/dL (ref 250–450)
UIBC: 246 ug/dL (ref 131–425)

## 2024-05-31 ENCOUNTER — Ambulatory Visit: Payer: Self-pay | Admitting: Family Medicine

## 2024-08-18 ENCOUNTER — Ambulatory Visit: Payer: Self-pay

## 2024-08-18 NOTE — Telephone Encounter (Signed)
 FYI Only or Action Required?: FYI only for provider: appointment scheduled on 1/29.  Patient was last seen in primary care on 05/27/2024 by Towana Small, FNP.  Called Nurse Triage reporting Fatigue and Alopecia.  Symptoms began several years ago.  Interventions attempted: Rest, hydration, or home remedies.  Symptoms are: gradually worsening.  Triage Disposition: See PCP When Office is Open (Within 3 Days)  Patient/caregiver understands and will follow disposition?: Yes    Several years ago onset of hair shedding, fatigue, dry hands and feet. Toes feel tingly and sensitive. Intermittent moderate lightheadedness. Has been gradually getting worse. Thinks she may have vitamin D deficiency. BP has been 130-140/80-90. HR has been 80-100. Has been having some intermittent anxiety with associated chest tightness for a while. Prescribed prozac , has not taken. Trying lifestyle modification at this time. Scheduled appt with PCP tomorrow. Advised UC or ED for worsening symptoms.     Summary: Fatigue, combination of other symptoms   Reason for Triage: Patient was initially calling to schedule a physical with pap (I did verify with CAL that her PCP does do this type of appt), but with the combination of current symptoms, TL advised to triage.  Symptoms: -Hair is fragile, falling out, and not growing -Feet are dry -Fatigue (not extreme fatigue)  Patient requested labs for these to test her vitamin d deficiency.         Reason for Disposition  [1] Fatigue (i.e., tires easily, decreased energy) AND [2] persists > 1 week  Answer Assessment - Initial Assessment Questions 1. DESCRIPTION: Describe how you are feeling.     Fatigue, hair shedding, dry hands and feet  2. SEVERITY: How bad is it?  Can you stand and walk?     Moderate  3. ONSET: When did these symptoms begin? (e.g., hours, days, weeks, months)     Several years ago  4. CAUSE: What do you think is causing the  weakness or fatigue? (e.g., not drinking enough fluids, medical problem, trouble sleeping)     Unsure, possible vitamin D deficiency   5. NEW MEDICINES:  Have you started on any new medicines recently? (e.g., opioid pain medicines, benzodiazepines, muscle relaxants, antidepressants, antihistamines, neuroleptics, beta blockers)     Denies  6. OTHER SYMPTOMS: Do you have any other symptoms? (e.g., chest pain, fever, cough, SOB, vomiting, diarrhea, bleeding, other areas of pain)     Intermittent dizziness  7. PREGNANCY: Is there any chance you are pregnant? When was your last menstrual period?     Denies, on depo provera, depression  Protocols used: Weakness (Generalized) and Fatigue-A-AH

## 2024-08-19 ENCOUNTER — Ambulatory Visit: Admitting: Family Medicine
